# Patient Record
Sex: Male | Born: 1935 | Race: White | Hispanic: No | Marital: Married | Smoking: Former smoker
Health system: Southern US, Community
[De-identification: ages and names within clinical notes are randomized; demographics above are authoritative.]

## PROBLEM LIST (undated history)

## (undated) DIAGNOSIS — I10 Essential (primary) hypertension: Secondary | ICD-10-CM

## (undated) DIAGNOSIS — I4891 Unspecified atrial fibrillation: Secondary | ICD-10-CM

## (undated) DIAGNOSIS — I350 Nonrheumatic aortic (valve) stenosis: Secondary | ICD-10-CM

## (undated) DIAGNOSIS — D649 Anemia, unspecified: Secondary | ICD-10-CM

## (undated) DIAGNOSIS — K922 Gastrointestinal hemorrhage, unspecified: Secondary | ICD-10-CM

## (undated) DIAGNOSIS — K8020 Calculus of gallbladder without cholecystitis without obstruction: Secondary | ICD-10-CM

## (undated) DIAGNOSIS — I2510 Atherosclerotic heart disease of native coronary artery without angina pectoris: Secondary | ICD-10-CM

## (undated) DIAGNOSIS — D4620 Refractory anemia with excess of blasts, unspecified: Secondary | ICD-10-CM

## (undated) DIAGNOSIS — Z79899 Other long term (current) drug therapy: Secondary | ICD-10-CM

## (undated) DIAGNOSIS — M1990 Unspecified osteoarthritis, unspecified site: Secondary | ICD-10-CM

## (undated) DIAGNOSIS — R350 Frequency of micturition: Secondary | ICD-10-CM

## (undated) DIAGNOSIS — Z973 Presence of spectacles and contact lenses: Secondary | ICD-10-CM

## (undated) DIAGNOSIS — I714 Abdominal aortic aneurysm, without rupture, unspecified: Secondary | ICD-10-CM

## (undated) DIAGNOSIS — E785 Hyperlipidemia, unspecified: Secondary | ICD-10-CM

## (undated) DIAGNOSIS — D61818 Other pancytopenia: Secondary | ICD-10-CM

## (undated) DIAGNOSIS — I639 Cerebral infarction, unspecified: Secondary | ICD-10-CM

## (undated) DIAGNOSIS — I509 Heart failure, unspecified: Secondary | ICD-10-CM

## (undated) DIAGNOSIS — I4719 Other supraventricular tachycardia: Secondary | ICD-10-CM

## (undated) DIAGNOSIS — J439 Emphysema, unspecified: Secondary | ICD-10-CM

## (undated) DIAGNOSIS — I471 Supraventricular tachycardia: Secondary | ICD-10-CM

## (undated) DIAGNOSIS — I493 Ventricular premature depolarization: Secondary | ICD-10-CM

## (undated) DIAGNOSIS — Z8679 Personal history of other diseases of the circulatory system: Secondary | ICD-10-CM

## (undated) DIAGNOSIS — D631 Anemia in chronic kidney disease: Secondary | ICD-10-CM

## (undated) DIAGNOSIS — Z7901 Long term (current) use of anticoagulants: Secondary | ICD-10-CM

## (undated) HISTORY — DX: Abdominal aortic aneurysm, without rupture: I71.4

## (undated) HISTORY — DX: Essential (primary) hypertension: I10

## (undated) HISTORY — DX: Other pancytopenia: D61.818

## (undated) HISTORY — DX: Unspecified atrial fibrillation: I48.91

## (undated) HISTORY — PX: JOINT REPLACEMENT: SHX530

## (undated) HISTORY — PX: EYE SURGERY: SHX253

## (undated) HISTORY — DX: Cerebral infarction, unspecified: I63.9

## (undated) HISTORY — DX: Anemia in chronic kidney disease: D63.1

## (undated) HISTORY — DX: Hyperlipidemia, unspecified: E78.5

## (undated) HISTORY — DX: Long term (current) use of anticoagulants: Z79.01

## (undated) HISTORY — DX: Refractory anemia with excess of blasts, unspecified: D46.20

## (undated) HISTORY — PX: HEMORRHOID SURGERY: SHX153

## (undated) HISTORY — DX: Personal history of other diseases of the circulatory system: Z86.79

## (undated) HISTORY — DX: Abdominal aortic aneurysm, without rupture, unspecified: I71.40

## (undated) HISTORY — DX: Supraventricular tachycardia: I47.1

## (undated) HISTORY — DX: Ventricular premature depolarization: I49.3

## (undated) HISTORY — PX: TONSILLECTOMY: SUR1361

## (undated) HISTORY — DX: Heart failure, unspecified: I50.9

## (undated) HISTORY — DX: Other supraventricular tachycardia: I47.19

## (undated) HISTORY — DX: Unspecified osteoarthritis, unspecified site: M19.90

## (undated) HISTORY — PX: COLON SURGERY: SHX602

## (undated) HISTORY — DX: Emphysema, unspecified: J43.9

## (undated) HISTORY — DX: Atherosclerotic heart disease of native coronary artery without angina pectoris: I25.10

## (undated) HISTORY — PX: ABDOMINAL AORTIC ANEURYSM REPAIR: SHX42

## (undated) HISTORY — DX: Presence of spectacles and contact lenses: Z97.3

## (undated) HISTORY — DX: Calculus of gallbladder without cholecystitis without obstruction: K80.20

## (undated) HISTORY — DX: Other long term (current) drug therapy: Z79.899

## (undated) HISTORY — DX: Nonrheumatic aortic (valve) stenosis: I35.0

---

## 1968-02-14 HISTORY — PX: CORONARY ARTERY BYPASS GRAFT: SHX141

## 1998-02-13 HISTORY — PX: CORONARY ARTERY BYPASS GRAFT: SHX141

## 1998-04-29 ENCOUNTER — Ambulatory Visit (HOSPITAL_COMMUNITY): Admission: RE | Admit: 1998-04-29 | Discharge: 1998-04-29 | Payer: Self-pay | Admitting: Cardiology

## 1998-04-29 HISTORY — PX: CARDIAC CATHETERIZATION: SHX172

## 1998-05-10 ENCOUNTER — Encounter: Payer: Self-pay | Admitting: Surgery

## 1998-05-12 ENCOUNTER — Encounter: Payer: Self-pay | Admitting: Surgery

## 1998-05-12 ENCOUNTER — Inpatient Hospital Stay (HOSPITAL_COMMUNITY): Admission: RE | Admit: 1998-05-12 | Discharge: 1998-05-17 | Payer: Self-pay | Admitting: Surgery

## 1998-05-13 ENCOUNTER — Encounter: Payer: Self-pay | Admitting: Surgery

## 1998-05-14 ENCOUNTER — Encounter: Payer: Self-pay | Admitting: Surgery

## 1998-07-13 ENCOUNTER — Encounter (HOSPITAL_COMMUNITY): Admission: RE | Admit: 1998-07-13 | Discharge: 1998-09-13 | Payer: Self-pay | Admitting: Cardiology

## 1998-09-14 ENCOUNTER — Encounter (HOSPITAL_COMMUNITY): Admission: RE | Admit: 1998-09-14 | Discharge: 1998-12-13 | Payer: Self-pay | Admitting: Cardiology

## 2002-07-30 ENCOUNTER — Emergency Department (HOSPITAL_COMMUNITY): Admission: EM | Admit: 2002-07-30 | Discharge: 2002-07-30 | Payer: Self-pay | Admitting: Emergency Medicine

## 2004-02-14 DIAGNOSIS — I639 Cerebral infarction, unspecified: Secondary | ICD-10-CM

## 2004-02-14 HISTORY — PX: CAROTID ENDARTERECTOMY: SUR193

## 2004-02-14 HISTORY — DX: Cerebral infarction, unspecified: I63.9

## 2004-05-06 ENCOUNTER — Ambulatory Visit (HOSPITAL_COMMUNITY): Admission: RE | Admit: 2004-05-06 | Discharge: 2004-05-06 | Payer: Self-pay | Admitting: Family Medicine

## 2004-06-27 ENCOUNTER — Ambulatory Visit (HOSPITAL_COMMUNITY): Admission: RE | Admit: 2004-06-27 | Discharge: 2004-06-27 | Payer: Self-pay | Admitting: Ophthalmology

## 2004-06-29 ENCOUNTER — Ambulatory Visit (HOSPITAL_COMMUNITY): Admission: RE | Admit: 2004-06-29 | Discharge: 2004-06-29 | Payer: Self-pay | Admitting: Ophthalmology

## 2004-10-28 ENCOUNTER — Ambulatory Visit: Payer: Self-pay | Admitting: Internal Medicine

## 2004-10-28 ENCOUNTER — Inpatient Hospital Stay (HOSPITAL_COMMUNITY): Admission: EM | Admit: 2004-10-28 | Discharge: 2004-11-11 | Payer: Self-pay | Admitting: Emergency Medicine

## 2004-10-28 ENCOUNTER — Encounter: Payer: Self-pay | Admitting: Internal Medicine

## 2004-11-01 ENCOUNTER — Encounter (INDEPENDENT_AMBULATORY_CARE_PROVIDER_SITE_OTHER): Payer: Self-pay | Admitting: *Deleted

## 2004-12-26 ENCOUNTER — Ambulatory Visit (HOSPITAL_COMMUNITY): Admission: RE | Admit: 2004-12-26 | Discharge: 2004-12-26 | Payer: Self-pay | Admitting: Family Medicine

## 2006-02-13 HISTORY — PX: REPLACEMENT TOTAL KNEE: SUR1224

## 2006-02-13 HISTORY — PX: ABDOMINAL AORTIC ANEURYSM REPAIR: SUR1152

## 2006-05-11 ENCOUNTER — Ambulatory Visit: Payer: Self-pay | Admitting: Vascular Surgery

## 2006-09-03 ENCOUNTER — Inpatient Hospital Stay (HOSPITAL_COMMUNITY): Admission: RE | Admit: 2006-09-03 | Discharge: 2006-09-07 | Payer: Self-pay | Admitting: Orthopedic Surgery

## 2006-11-14 ENCOUNTER — Ambulatory Visit: Payer: Self-pay | Admitting: Vascular Surgery

## 2007-04-22 HISTORY — PX: US ECHOCARDIOGRAPHY: HXRAD669

## 2007-09-25 ENCOUNTER — Ambulatory Visit: Payer: Self-pay | Admitting: Vascular Surgery

## 2007-10-02 ENCOUNTER — Ambulatory Visit: Payer: Self-pay | Admitting: Vascular Surgery

## 2007-10-10 ENCOUNTER — Ambulatory Visit: Payer: Self-pay | Admitting: Vascular Surgery

## 2007-10-10 ENCOUNTER — Encounter: Admission: RE | Admit: 2007-10-10 | Discharge: 2007-10-10 | Payer: Self-pay | Admitting: Vascular Surgery

## 2007-10-29 ENCOUNTER — Inpatient Hospital Stay (HOSPITAL_COMMUNITY): Admission: RE | Admit: 2007-10-29 | Discharge: 2007-10-30 | Payer: Self-pay | Admitting: Vascular Surgery

## 2007-10-29 ENCOUNTER — Ambulatory Visit: Payer: Self-pay | Admitting: Vascular Surgery

## 2007-10-30 ENCOUNTER — Encounter: Payer: Self-pay | Admitting: Vascular Surgery

## 2007-11-27 ENCOUNTER — Ambulatory Visit: Payer: Self-pay | Admitting: Vascular Surgery

## 2007-11-27 ENCOUNTER — Encounter: Admission: RE | Admit: 2007-11-27 | Discharge: 2007-11-27 | Payer: Self-pay | Admitting: Vascular Surgery

## 2008-02-10 ENCOUNTER — Encounter: Admission: RE | Admit: 2008-02-10 | Discharge: 2008-02-10 | Payer: Self-pay | Admitting: Vascular Surgery

## 2008-02-26 ENCOUNTER — Ambulatory Visit: Payer: Self-pay | Admitting: Vascular Surgery

## 2008-09-02 ENCOUNTER — Encounter: Payer: Self-pay | Admitting: Gastroenterology

## 2008-09-02 ENCOUNTER — Ambulatory Visit: Payer: Self-pay | Admitting: Vascular Surgery

## 2008-09-02 ENCOUNTER — Encounter: Admission: RE | Admit: 2008-09-02 | Discharge: 2008-09-02 | Payer: Self-pay | Admitting: Vascular Surgery

## 2008-10-01 ENCOUNTER — Ambulatory Visit: Payer: Self-pay | Admitting: Gastroenterology

## 2008-10-01 DIAGNOSIS — I2510 Atherosclerotic heart disease of native coronary artery without angina pectoris: Secondary | ICD-10-CM | POA: Insufficient documentation

## 2008-10-01 DIAGNOSIS — R933 Abnormal findings on diagnostic imaging of other parts of digestive tract: Secondary | ICD-10-CM

## 2009-06-21 ENCOUNTER — Encounter: Payer: Self-pay | Admitting: Internal Medicine

## 2009-06-22 ENCOUNTER — Ambulatory Visit: Payer: Self-pay | Admitting: Internal Medicine

## 2009-06-22 ENCOUNTER — Inpatient Hospital Stay (HOSPITAL_COMMUNITY): Admission: EM | Admit: 2009-06-22 | Discharge: 2009-06-22 | Payer: Self-pay | Admitting: Emergency Medicine

## 2009-08-18 HISTORY — PX: US ECHOCARDIOGRAPHY: HXRAD669

## 2009-08-19 HISTORY — PX: CARDIOVASCULAR STRESS TEST: SHX262

## 2009-09-08 ENCOUNTER — Inpatient Hospital Stay (HOSPITAL_COMMUNITY): Admission: RE | Admit: 2009-09-08 | Discharge: 2009-09-14 | Payer: Self-pay | Admitting: General Surgery

## 2009-09-08 ENCOUNTER — Encounter (INDEPENDENT_AMBULATORY_CARE_PROVIDER_SITE_OTHER): Payer: Self-pay | Admitting: General Surgery

## 2009-09-20 ENCOUNTER — Encounter (INDEPENDENT_AMBULATORY_CARE_PROVIDER_SITE_OTHER): Payer: Self-pay | Admitting: General Surgery

## 2009-09-20 ENCOUNTER — Ambulatory Visit: Admission: RE | Admit: 2009-09-20 | Discharge: 2009-09-20 | Payer: Self-pay | Admitting: General Surgery

## 2009-09-20 ENCOUNTER — Ambulatory Visit: Payer: Self-pay | Admitting: Vascular Surgery

## 2009-11-10 ENCOUNTER — Ambulatory Visit: Payer: Self-pay | Admitting: Vascular Surgery

## 2009-11-10 ENCOUNTER — Encounter: Admission: RE | Admit: 2009-11-10 | Discharge: 2009-11-10 | Payer: Self-pay | Admitting: Vascular Surgery

## 2009-11-11 ENCOUNTER — Ambulatory Visit: Payer: Self-pay | Admitting: Vascular Surgery

## 2009-11-19 ENCOUNTER — Ambulatory Visit: Payer: Self-pay | Admitting: Vascular Surgery

## 2009-11-19 ENCOUNTER — Ambulatory Visit (HOSPITAL_COMMUNITY): Admission: RE | Admit: 2009-11-19 | Discharge: 2009-11-19 | Payer: Self-pay | Admitting: Vascular Surgery

## 2009-11-23 ENCOUNTER — Encounter: Admission: RE | Admit: 2009-11-23 | Discharge: 2009-11-23 | Payer: Self-pay | Admitting: Vascular Surgery

## 2010-03-05 ENCOUNTER — Other Ambulatory Visit: Payer: Self-pay | Admitting: Vascular Surgery

## 2010-03-05 DIAGNOSIS — I714 Abdominal aortic aneurysm, without rupture: Secondary | ICD-10-CM

## 2010-03-17 NOTE — Miscellaneous (Signed)
Summary: Hospital Admission  INTERNAL MEDICINE ADMISSION HISTORY AND PHYSICAL  Attending: Dr. Rogelia Boga 1st contact:  Dr. Arvilla Market [phone] 2nd contact: Dr. Gwenlyn Perking [phone]  Holidays, weekends and after 5 pm weekdays 1st: [phone] 2nd: [phone]  PCP: Aida Puffer  CC: None  HPI: [name] is an 77 y/o M with PMH outlined below who presents to the ED for c/c of acute onset left sided low back pain, associated with nausea and increased frequency.  He states the pain woke him from sleep on the morning of admission and is describes constant and severe in nature with no worsening or alleviating factors.  He also reports occasional malaise and nausea with dry heaves on the morning of admission and while in the ED.  He denies fever, chills, emesis, diarrhea, abdominal pain, hematuria, chest pain, SOB, dizziness, syncope and dysuria.  He admits to 1-2 days of increased urinary frequency and nocturia.    He was recently prescribed a course of abx for an infected cyst under his left axilla.  He reports this is significantly improved with abx and will be removed by his PCP soon.  He has no other concerns or complains.  ALLERGIES: NKDA   PAST MEDICAL HISTORY: Abdominal aortic aneurysm  Hx of coronary artery disease     - s/p 5 vessel CABG Hypertension Hx of Cerebrovascular accident x3  Hx of left carotid endarterectomy gout osteoarthritis      - s/p R TKA hyperlipidemia Kidney Stones Gout   MEDICATIONS: CENTRUM SILVER  TABS (MULTIPLE VITAMINS-MINERALS) 1 by mouth once daily MULTIVITAMINS   TABS (MULTIPLE VITAMIN) 1 by mouth at bedtime COREG 6.25 MG TABS (CARVEDILOL) 1 by mouth two times a day ASPIRIN 81 MG TABS (ASPIRIN) 1 by mouth at bedtime COLCHICINE 0.6 MG TABS (COLCHICINE) as needed for gout AMLODIPINE BESY-BENAZEPRIL HCL 2.5-10 MG CAPS (AMLODIPINE BESY-BENAZEPRIL HCL) 1 by mouth every pm    SOCIAL HISTORY: Occupation: Retired Married 4 boys 1 girl Patient currently smokes.    Alcohol Use - yes: 2 beers daily Daily Caffeine Use:2 cups of coffee daily Illicit Drug Use - no   FAMILY HISTORY Family History of Colon Cancer Family History of Heart Disease: Father-MI, deceased at 75 y/o Family History of Liver Cancer: Brother   ROS: as per HPI, all other systems reviewed and negative   VITALS: T: 97.8  P: 73  BP: 158/84>>>188/89  R: 14  O2SAT: 98% ON: 2L  PHYSICAL EXAM: General:  alert, well-developed, NAD, cooperative, A&Ox3 Head:  normocephalic and atraumatic.   Eyes:  PERRLA, EOMI, vision grossly intact, conjuctive and sclerae within normal limits.   Mouth:  MMM, no erythema, no exudates, or lesions.   Neck:  supple, full ROM, trachea midline, no palp masses, no JVD, no carotid bruits.   Lungs:  CTAB, normal respiratory effort  Heart: RRR Abdomen:  soft, NT, ND, BS present and normoactive, no palpable masses  GU: + left sided CVA tenderness and flank pain. Msk:  no joint swelling, warmth, or erythema  Neurologic:  No focal deficit   Skin:  turgor normal and no rashes.    LABS:  WBC                                      7.9               4.0-10.5           RBC  4.37              4.22-5.81          Hemoglobin (HGB)                         15.2              13.0-17.0          Hematocrit (HCT)                         43.8              39.0-52.0        %  MCV                                      100.2      h      78.0-100.0         MCHC                                     34.7              30.0-36.0        L  RDW                                      13.6              11.5-15.5        %  Platelet Count (PLT)                     179               150-400          L  Neutrophils, %                           82         h      43-77            %  Lymphocytes, %                           12                12-46            %  Monocytes, %                             5                 3-12             %  Eosinophils, %                            1                 0-5              %  Basophils, %  1                 0-1              %  Neutrophils, Absolute                    6.5               1.7-7.7            Lymphocytes, Absolute                    1.0               0.7-4.0            Monocytes, Absolute                      0.4               0.1-1.0            Eosinophils, Absolute                    0.0               0.0-0.7            Basophils, Absolute                      0.1               0.0-0.1             CKMB, POC                                2.1               1.0-8.0            Troponin I, POC                          <0.05             0.00-0.09          Myoglobin, POC                           164               12-200             Sodium (NA)                              139               135-145            Potassium (K)                            4.5               3.5-5.1              HEMOLYZED SPECIMEN, RESULTS MAY BE AFFECTED  Chloride  105               96-112             CO2                                      26                19-32              Glucose                                  119        h      70-99              BUN                                      13                6-23               Creatinine                               1.04              0.4-1.5            GFR, Est Non African American            >60               >60                GFR, Est African American                >60               >60                  Oversized comment, see footnote  1  Calcium                                  9.9               8.4-10.5           TCO2                                      24                0-100              Ionized Calcium                          1.03       l      1.12-1.32          Hemoglobin (HGB)                         16.7  13.0-17.0          Hematocrit (HCT)                         49.0               39.0-52.0        %  Sodium (NA)                              136               135-145            Potassium (K)                            4.5               3.5-5.1            Chloride                                 107               96-112             Glucose                                  113        h      70-99              BUN                                      18                6-23               Creatinine                               0.9               0.4-1.5             Color, Urine                             YELLOW            YELLOW  Appearance                               CLOUDY     a      CLEAR  Specific Gravity                         1.019             1.005-1.030  pH                                       6.0  5.0-8.0  Urine Glucose                            NEGATIVE          NEG                Bilirubin                                NEGATIVE          NEG  Ketones                                  NEGATIVE          NEG                Blood                                    TRACE      a      NEG  Protein                                  NEGATIVE          NEG                Urobilinogen                             0.2               0.0-1.0            Nitrite                                  POSITIVE   a      NEG  Leukocytes                               MODERATE   a      NEG   WBC / HPF                                21-50             <3                 RBC / HPF                                7-10              <3                 Bacteria / HPF                           MANY       a      RARE  ASSESSMENT AND PLAN: (1) UTI w/ possible pyelonephritis Pts hx and UA  are c/w UTI and possible pyleonephritis.  He is without signs or symptoms of systemic infection. - Will admit to regular floor - Gentle hydration with IVFs - Morphine for pain - Zofran for nausea - Send urine for cx and sensitivity - Start empiric abx with IV Cipro with plan to transition to  oral form once pt is no longer nauseated with dry heaves and sensitivity is back.    (2)Nausea Likely 2/2 #1.   - Will tx symptomatically with Zofran and add Phenergan if needed. - IVFs - Will check BMET in the am, follow and replete electrolytes as needed.  (3)HTN BP slightly elevated with SBP of 185 on admission.  This likely reflects acute elevation 2/2 pain/discomfort in addition to lack of regular meds (pt did not take any meds today). - Will restart home meds - Continue to monitor - control pain  (4)CAD This appears to be stable. - Will continue home dose of Coreg and ASA.  (5)Macrocytosis Will check B12 level, TSH and folic Acid level. Most likely due to increased alcohol consumption.  (6)VTE PROPH: Lovenox   Clinical Lists Changes

## 2010-04-27 LAB — POCT I-STAT, CHEM 8
Creatinine, Ser: 1 mg/dL (ref 0.4–1.5)
Glucose, Bld: 84 mg/dL (ref 70–99)
Hemoglobin: 12.9 g/dL — ABNORMAL LOW (ref 13.0–17.0)
TCO2: 27 mmol/L (ref 0–100)

## 2010-04-27 LAB — CBC
MCH: 28.9 pg (ref 26.0–34.0)
MCHC: 32.3 g/dL (ref 30.0–36.0)
Platelets: 177 10*3/uL (ref 150–400)

## 2010-04-29 LAB — CBC
Hemoglobin: 9.5 g/dL — ABNORMAL LOW (ref 13.0–17.0)
MCHC: 34 g/dL (ref 30.0–36.0)
MCHC: 34.8 g/dL (ref 30.0–36.0)
MCV: 97.6 fL (ref 78.0–100.0)
Platelets: 144 10*3/uL — ABNORMAL LOW (ref 150–400)
Platelets: 175 10*3/uL (ref 150–400)
RBC: 2.82 MIL/uL — ABNORMAL LOW (ref 4.22–5.81)
RDW: 14.2 % (ref 11.5–15.5)
WBC: 4.2 10*3/uL (ref 4.0–10.5)

## 2010-04-30 LAB — COMPREHENSIVE METABOLIC PANEL
AST: 16 U/L (ref 0–37)
Albumin: 3.8 g/dL (ref 3.5–5.2)
BUN: 15 mg/dL (ref 6–23)
Calcium: 9.3 mg/dL (ref 8.4–10.5)
Chloride: 103 mEq/L (ref 96–112)
Creatinine, Ser: 1.16 mg/dL (ref 0.4–1.5)
GFR calc Af Amer: >60 mL/min (ref >60)
Total Protein: 6.3 g/dL (ref 6.0–8.3)

## 2010-04-30 LAB — BASIC METABOLIC PANEL
BUN: 14 mg/dL (ref 6–23)
CO2: 28 mEq/L (ref 19–32)
Calcium: 8.5 mg/dL (ref 8.4–10.5)
Calcium: 8.9 mg/dL (ref 8.4–10.5)
Chloride: 102 mEq/L (ref 96–112)
Chloride: 108 mEq/L (ref 96–112)
Creatinine, Ser: 0.78 mg/dL (ref 0.4–1.5)
Creatinine, Ser: 0.87 mg/dL (ref 0.4–1.5)
Creatinine, Ser: 0.92 mg/dL (ref 0.4–1.5)
GFR calc Af Amer: >60 mL/min (ref >60)
GFR calc Af Amer: >60 mL/min (ref >60)
GFR calc non Af Amer: >60 mL/min (ref >60)
Glucose, Bld: 114 mg/dL — ABNORMAL HIGH (ref 70–99)
Glucose, Bld: 93 mg/dL (ref 70–99)
Potassium: 4.4 mEq/L (ref 3.5–5.1)
Sodium: 137 mEq/L (ref 135–145)

## 2010-04-30 LAB — CBC
HCT: 28.3 % — ABNORMAL LOW (ref 39.0–52.0)
HCT: 34.6 % — ABNORMAL LOW (ref 39.0–52.0)
Hemoglobin: 10.5 g/dL — ABNORMAL LOW (ref 13.0–17.0)
Hemoglobin: 11.8 g/dL — ABNORMAL LOW (ref 13.0–17.0)
Hemoglobin: 12.5 g/dL — ABNORMAL LOW (ref 13.0–17.0)
Hemoglobin: 8.7 g/dL — ABNORMAL LOW (ref 13.0–17.0)
MCH: 34 pg (ref 26.0–34.0)
MCH: 34.1 pg — ABNORMAL HIGH (ref 26.0–34.0)
MCH: 34.2 pg — ABNORMAL HIGH (ref 26.0–34.0)
MCH: 34.3 pg — ABNORMAL HIGH (ref 26.0–34.0)
MCHC: 33.9 g/dL (ref 30.0–36.0)
MCHC: 34 g/dL (ref 30.0–36.0)
MCHC: 34.2 g/dL (ref 30.0–36.0)
MCHC: 34.2 g/dL (ref 30.0–36.0)
MCHC: 34.4 g/dL (ref 30.0–36.0)
MCV: 100.1 fL — ABNORMAL HIGH (ref 78.0–100.0)
MCV: 100.3 fL — ABNORMAL HIGH (ref 78.0–100.0)
Platelets: 113 10*3/uL — ABNORMAL LOW (ref 150–400)
Platelets: 128 10*3/uL — ABNORMAL LOW (ref 150–400)
Platelets: 130 10*3/uL — ABNORMAL LOW (ref 150–400)
Platelets: 136 10*3/uL — ABNORMAL LOW (ref 150–400)
Platelets: 161 10*3/uL (ref 150–400)
RBC: 2.54 MIL/uL — ABNORMAL LOW (ref 4.22–5.81)
RBC: 3.08 MIL/uL — ABNORMAL LOW (ref 4.22–5.81)
RBC: 3.45 MIL/uL — ABNORMAL LOW (ref 4.22–5.81)
RDW: 14.8 % (ref 11.5–15.5)
RDW: 15 % (ref 11.5–15.5)
WBC: 5.5 10*3/uL (ref 4.0–10.5)
WBC: 8.9 10*3/uL (ref 4.0–10.5)

## 2010-04-30 LAB — SURGICAL PCR SCREEN
MRSA, PCR: NEGATIVE
Staphylococcus aureus: NEGATIVE

## 2010-04-30 LAB — CEA: CEA: 1.2 ng/mL (ref 0.0–5.0)

## 2010-04-30 LAB — DIFFERENTIAL
Eosinophils Relative: 3 % (ref 0–5)
Lymphocytes Relative: 25 % (ref 12–46)
Lymphs Abs: 1 10*3/uL (ref 0.7–4.0)
Monocytes Absolute: 0.5 10*3/uL (ref 0.1–1.0)
Monocytes Relative: 13 % — ABNORMAL HIGH (ref 3–12)
Neutro Abs: 2.3 10*3/uL (ref 1.7–7.7)

## 2010-04-30 LAB — MAGNESIUM: Magnesium: 1.8 mg/dL (ref 1.5–2.5)

## 2010-05-03 LAB — DIFFERENTIAL
Basophils Relative: 1 % (ref 0–1)
Eosinophils Absolute: 0 10*3/uL (ref 0.0–0.7)
Eosinophils Relative: 1 % (ref 0–5)
Lymphs Abs: 1 10*3/uL (ref 0.7–4.0)
Monocytes Relative: 5 % (ref 3–12)
Neutrophils Relative %: 82 % — ABNORMAL HIGH (ref 43–77)

## 2010-05-03 LAB — BASIC METABOLIC PANEL
BUN: 13 mg/dL (ref 6–23)
BUN: 9 mg/dL (ref 6–23)
CO2: 26 mEq/L (ref 19–32)
CO2: 28 mEq/L (ref 19–32)
Calcium: 8.9 mg/dL (ref 8.4–10.5)
Chloride: 101 mEq/L (ref 96–112)
Chloride: 105 mEq/L (ref 96–112)
Creatinine, Ser: 0.84 mg/dL (ref 0.4–1.5)
Creatinine, Ser: 1.04 mg/dL (ref 0.4–1.5)
GFR calc Af Amer: >60 mL/min (ref >60)
Glucose, Bld: 143 mg/dL — ABNORMAL HIGH (ref 70–99)
Potassium: 4.5 mEq/L (ref 3.5–5.1)

## 2010-05-03 LAB — URINE MICROSCOPIC-ADD ON

## 2010-05-03 LAB — HEPATIC FUNCTION PANEL
ALT: 16 U/L (ref 0–53)
AST: 30 U/L (ref 0–37)
Albumin: 4 g/dL (ref 3.5–5.2)
Alkaline Phosphatase: 82 U/L (ref 39–117)
Bilirubin, Direct: 0.2 mg/dL (ref 0.0–0.3)
Total Bilirubin: 1.1 mg/dL (ref 0.3–1.2)

## 2010-05-03 LAB — CBC
HCT: 43.8 % (ref 39.0–52.0)
MCHC: 34.7 g/dL (ref 30.0–36.0)
MCHC: 35.2 g/dL (ref 30.0–36.0)
MCV: 100.2 fL — ABNORMAL HIGH (ref 78.0–100.0)
MCV: 100.3 fL — ABNORMAL HIGH (ref 78.0–100.0)
Platelets: 179 10*3/uL (ref 150–400)
RBC: 3.77 MIL/uL — ABNORMAL LOW (ref 4.22–5.81)
RBC: 4.37 MIL/uL (ref 4.22–5.81)
RDW: 13.4 % (ref 11.5–15.5)

## 2010-05-03 LAB — POCT I-STAT, CHEM 8
Calcium, Ion: 1.03 mmol/L — ABNORMAL LOW (ref 1.12–1.32)
Creatinine, Ser: 0.9 mg/dL (ref 0.4–1.5)
Glucose, Bld: 113 mg/dL — ABNORMAL HIGH (ref 70–99)
HCT: 49 % (ref 39.0–52.0)
Hemoglobin: 16.7 g/dL (ref 13.0–17.0)
Potassium: 4.5 mEq/L (ref 3.5–5.1)
TCO2: 24 mmol/L (ref 0–100)

## 2010-05-03 LAB — POCT CARDIAC MARKERS: CKMB, poc: 2.1 ng/mL (ref 1.0–8.0)

## 2010-05-03 LAB — URINALYSIS, ROUTINE W REFLEX MICROSCOPIC
Ketones, ur: NEGATIVE mg/dL
Nitrite: POSITIVE — AB
pH: 6 (ref 5.0–8.0)

## 2010-05-03 LAB — TSH: TSH: 2.397 u[IU]/mL (ref 0.350–4.500)

## 2010-05-03 LAB — URINE CULTURE: Colony Count: >=100000

## 2010-05-03 LAB — FOLATE: Folate: >20 ng/mL

## 2010-06-01 ENCOUNTER — Other Ambulatory Visit: Payer: Self-pay | Admitting: Interventional Radiology

## 2010-06-01 DIAGNOSIS — T82330A Leakage of aortic (bifurcation) graft (replacement), initial encounter: Secondary | ICD-10-CM

## 2010-06-02 ENCOUNTER — Other Ambulatory Visit: Payer: Self-pay

## 2010-06-02 ENCOUNTER — Ambulatory Visit: Payer: Self-pay | Admitting: Vascular Surgery

## 2010-06-23 ENCOUNTER — Ambulatory Visit: Payer: Self-pay | Admitting: Vascular Surgery

## 2010-06-23 ENCOUNTER — Other Ambulatory Visit: Payer: Self-pay

## 2010-06-23 ENCOUNTER — Ambulatory Visit
Admission: RE | Admit: 2010-06-23 | Discharge: 2010-06-23 | Disposition: A | Discharge status: Home / Self Care | Payer: Medicare Other | Source: Ambulatory Visit | Attending: Vascular Surgery | Admitting: Vascular Surgery

## 2010-06-23 ENCOUNTER — Ambulatory Visit (INDEPENDENT_AMBULATORY_CARE_PROVIDER_SITE_OTHER): Payer: Medicare Other | Admitting: Vascular Surgery

## 2010-06-23 DIAGNOSIS — I714 Abdominal aortic aneurysm, without rupture: Secondary | ICD-10-CM

## 2010-06-23 MED ORDER — IOHEXOL 350 MG/ML SOLN
100.0000 mL | Freq: Once | INTRAVENOUS | Status: AC | PRN
  Administered 2010-06-23: 100 mL via INTRAVENOUS

## 2010-06-24 NOTE — Assessment & Plan Note (Signed)
OFFICE VISIT  [name], [name] DOB:  06-Dec-1926                                       06/23/2010 ZOXWR#:[id number]  The patient returns for followup today.  He was last seen in September of 2011.  He previously underwent placement of a Gore excluder stent graft in September of 2009.  At that time his aortic aneurysm was 5.1 cm in diameter.  Since placement of the stent graft he has had a type 2 endoleak.  He has had a small amount of aneurysm growth over time.  He returns for further followup today regarding this.  He denies any abdominal or back pain.  He states that overall he feels well.  He has lost some weight recently but this has been intentional.  We have also followed the patient for carotid disease.  He previously had a left carotid endarterectomy in 2006.  He has had no symptoms of TIA or amaurosis or stroke since then.  MEDICATIONS: 1. Include Coreg 6.25 mg once a day. 2. Vitamin C. 3. Vitamin E. 4. Aspirin 81 mg once a day. 5. Co-Q 10. 6. Multivitamin. 7. Colchicine 0.6 mg p.r.n.  ALLERGIES:  He has no known drug allergies.  CHRONIC MEDICAL PROBLEMS:  Include hypertension, both of these are currently controlled.  SOCIAL HISTORY:  He still continues to smoke 3-4 cigars per day and drinks approximately 3-4 beers per day.  PHYSICAL EXAM:  Vital signs:  Blood pressure is 181/78 in the right arm, heart rate 69 and regular, oxygen saturation is 98%.  HEENT: Unremarkable.  Neck:  Has 2+ carotid pulses without bruit.  Chest: Clear to auscultation.  Cardiac:  Regular rate and rhythm without murmur.  Abdomen:  Soft, nontender, nondistended.  He is thin but there is no palpable pulsatile mass in the epigastrium.  He has 2+ femoral pulses bilaterally.  I reviewed his CT angiogram of the abdomen and pelvis from today.  This shows that the aneurysm diameter is fairly stable compared to the fall but again has increased in size and is currently  6.4 cm in diameter. Type 2 endoleak is still noted.  SUMMARY: 1. Carotid occlusive disease.  Overall he has no symptoms at this     point.  His most recent carotid duplex showed no evidence of     restenosis.  I believe the best management for this is continued     antiplatelet therapy and risk factor modification. 2. As far as his aneurysm is concerned he has had aneurysm growth     since placement of his aortic stent graft.  He has a known type 2     leak.  The aneurysm seems to maybe have grown slightly since his     last scan in the fall.  He previously had an aortogram which showed     a couple of small lumbar arteries but these did not appear to fill     the aneurysm sac.  I believe that this warrants further     investigation with either consideration for direct puncture of the     aortic sac and coil embolization of these lumbar arteries or a     repeat aortogram with selective catheterization of the iliac and     mesenteric branches to see if we can find a communication to the     aneurysm sac.  I left a message with Dr. Ruel Favors from     interventional radiology who had previously seen the patient in the     fall.  He will return my call later in the day to determine what     the next best course of action would be for evaluation of the     patient's aneurysm.  I will call the patient regarding the     findings.    Janetta Hora. Ikeya Brockel, MD Electronically Signed  CEF/MEDQ  D:  06/24/2010  T:  06/24/2010  Job:  [id number]  cc:   Peter M. [place], M.D. Aida Puffer, MD

## 2010-06-28 NOTE — Op Note (Signed)
NAME:  [name], [name] NO.:  [id number]   MEDICAL RECORD NO.:  [id number]          PATIENT TYPE:  INP   LOCATION:  0002                         FACILITY:  [facility]   PHYSICIAN:  John L. Rendall, M.D.  DATE OF BIRTH:  09/22/1926   DATE OF PROCEDURE:  09/03/2006  DATE OF DISCHARGE:                               OPERATIVE REPORT   PREOPERATIVE DIAGNOSIS:  Osteoarthritis, right knee.   POSTOPERATIVE DIAGNOSIS:  Osteoarthritis, right knee.   SURGICAL PROCEDURES:  Right LCS total knee replacement with computer  navigation assistance.   SURGEON:  John L. Rendall, M.D.   ASSISTANTArlys John D. Petrarca, P.A.-C.   ANESTHESIA:  Spinal.   PATHOLOGY:  Bone against bone, medial compartment, right knee, with a  fixed 5.5 degrees varus deformity and hyperextension of about 5 degrees.   JUSTIFICATION FOR COMPUTER:  Varus deformity.   PROCEDURE:  Under spinal anesthesia, the right leg was prepared with  DuraPrep and draped as a sterile field, with a sterile tourniquet  applied to the proximal thigh.  The leg was wrapped out with the  Esmarch.  The tourniquet is used at 300 mm.  A midline incision is made.  The patella is everted.  The femur is sized to a large.  The knee is  debrided in preparation for computer mapping.  Schanz pins are then  placed through extra stab wounds in the superomedial tibia and the  distal medial femur.  The arrays are set up.  Computer mapping is then  performed.  Once this is done and verified within 0.7 mm of anatomic  accuracy, the knee parameters are measured.  The knee is in 5.5 degrees  of varus.  It is hyperextending 5 degrees.  The proximal tibial  resection is then carried out using the computer as a guide.  Balancing  then is done of the medial structures.  Initially after the resection of  the proximal tibia, it was still in 3 degrees of varus.  More stripping  along the medial capsule and MCL is done.  This brought it within 0  degrees  of anatomic alignment, and the attention is then turned to the  femur.  The anterior and posterior flare of the distal femur are  resected using the computer guide.  Distal femoral cut is then done,  making sure flexion and extension gaps are balanced at 10 mm.  Debridement is then done with lamina spreader, osteotome, and mallet,  removing spurs off the back of the femoral condyle.  Once this is  completed, the recessing guide is then used.  The proximal tibia is then  exposed.  It is sized to a #5.  Center peg hole with keel is inserted.  Trial reduction of a #5 tibia, 10 bearing, and large femur reveals  excellent fit, alignment, and stability.  The computer verifies 0  degrees extension.  It shows an amazing slight varus that is not visible  clinically.  The leg looks to be in slight valgus.  At this point,  computer assistance is terminated and permanent components obtained.  The bony surfaces are  prepared with pulse irrigation.  The permanent  components are cemented in place.  Once it hardens, the tourniquet is  let down  right at 1 hour.  Multiple small vessels are cauterized.  A medium  Hemovac is inserted.  The knee is then closed in layers with #1 Tycron,  #1 Vicryl, 2-0 Vicryl, and skin clips.  Operative time an hour and 10  minutes.   The patient tolerated the procedure well and returned to recovery in  good condition.      John L. Rendall, M.D.  Electronically Signed     JLR/MEDQ  D:  09/03/2006  T:  09/03/2006  Job:  [id number]

## 2010-06-28 NOTE — Assessment & Plan Note (Signed)
OFFICE VISIT   [name], [name]  DOB:  [birth year]                                       11/14/2006  ZOXWR#:[id number]   Mr. Police returns for followup today.  We are following for a small  abdominal aortic aneurysm as well as carotid stenosis.  He states that  he has been doing well since we last saw him at the end of March, 2008.  He reports no symptoms of TIA or stroke.  He recently had his right knee  replaced without difficulty.  He denies any abdominal or back pain.  He  states that recently on a followup x-ray, he has some calcification  around his artery in his right knee; however, he states he does not have  any claudication symptoms and no rest pain.  His atherosclerotic risk  factors continue to include elevated cholesterol and hypertension.  He  does not smoke.   PHYSICAL EXAMINATION:  VITAL SIGNS:  Blood pressure 192/96, pulse 71 and  regular.  HEENT:  Unremarkable.  NECK:  He has 2+ carotid pulses with no bruits.  Well healed left neck  incision.  CHEST:  Clear to auscultation.  CARDIOVASCULAR:  Regular rate and rhythm.  ABDOMEN:  Soft, nontender, nondistended with an easily palpable  pulsatile mass to the left of the epigastrium.  EXTREMITIES:  He has 2+ femoral, 3+ right popliteal, 2+ left popliteal  and 1+ pedal pulses.  The right leg is slightly more swollen than the  left and he has a healing midline knee incision.  He has no ulcerations  on the feet.   Overall, [name] is doing well.  He has had no recurrent TIA or  stroke symptoms.  We will re-duplex his carotid in 6 months' time.  At  that time, also, we will duplex his popliteal arteries to make sure he  has no evidence of aneurysm in that location, since his right pulse was  fairly full.  As far as his abdominal aortic aneurysm is concerned, he  is still well below 5.5 cm that we would consider repair.  He was 4.6 cm  today, which may be slightly increased from September,  2007.  If it ever  approaches 5.5 cm we will consider repair at that time.  I reinforced to  [name] that he should continue to take his aspirin.  Overall, he  seems to take good care of himself and is a pleasant patient to take  care of.   Janetta Hora. Fields, MD  Electronically Signed   CEF/MEDQ  D:  11/14/2006  T:  11/15/2006  Job:  [id number]   cc:   Flint Melter, M.D.

## 2010-06-28 NOTE — Procedures (Signed)
DUPLEX ULTRASOUND OF ABDOMINAL AORTA   INDICATION:  Followup of known AAA.   HISTORY:  Diabetes:  No.  Cardiac:  CABG x5 in 2000.  Hypertension:  Yes.  Smoking:  Yes, a few cigars daily.  Connective Tissue Disorder:  Family History:  Brother.  Previous Surgery:  Left CEA with DPA in 10/2004 by Dr. Darrick Penna.  Cardiac.   DUPLEX EXAM:         AP (cm)                   TRANSVERSE (cm)  Proximal             1.62 cm                   1.77 cm  Mid                  5.02 cm                   5.02 cm  Distal               1.59 cm                   1.77 cm  Right Iliac          1.16 cm                   1.16 cm  Left Iliac           1.04 cm                   1.04 cm   PREVIOUS:  Date: 11/14/2006  AP:  4.54  TRANSVERSE:  4.64   IMPRESSION:  1. An increase in known AAA with maximum diameter measuring 5.02 cm      (AP) x 5.02 cm.  2. Mildly ectatic CIAs bilaterally, right > left.  3. Right popliteal artery with maximum diameter measuring 0.83 cm (AP)      x 0.83 cm.  4. Left popliteal artery with maximum diameter measuring 0.76 cm (AP)      x 0.72 cm.  5. Patient has appointment with Dr. Darrick Penna on 10/02/2007 at 10 a.m.   ___________________________________________  Janetta Hora. Fields, MD   PB/MEDQ  D:  09/26/2007  T:  09/26/2007  Job:  [id number]

## 2010-06-28 NOTE — Assessment & Plan Note (Signed)
OFFICE VISIT   [name], [name]  DOB:  06/27/1926                                       10/02/2007  OZHYQ#:[id number]   The patient is a 77 year old male who we have been following for an  abdominal aortic aneurysm and previous carotid stenosis with previous  left carotid endarterectomy.  He presents today for repeat aortic  ultrasound and carotid duplex exam.  Since his last office visit he has  had no significant problems.  He states he does fatigue easily.  Unfortunately he continues to smoke a few cigars daily.   PAST MEDICAL AND SURGICAL HISTORY:  Remarkable for coronary artery  bypass grafting by Dr. Laneta Simmers in 2000.  He had a hemorrhoidectomy in  1996.  He also has a history of gout, hypertension and elevated  cholesterol.  He underwent left carotid endarterectomy in September of  2006.   FAMILY HISTORY:  Unremarkable.   SOCIAL HISTORY:  He is married and has five children.  He is a former  Medical illustrator.  Smoking history is as above.  Alcohol, he currently is  consuming two 22 ounce Budweisers daily.   REVIEW OF SYSTEMS:  CONSTITUTIONAL:  He is 5 feet 11 inches and 175  pounds.  Cardiac, pulmonary, GI, renal, neurologic, orthopedic, psychiatric, ENT  and hematologic review of systems are all negative.  ORTHOPEDIC:  He still has some pain in his right knee after having this  replaced in 2008.   MEDICATIONS:  1. Include CoQ10 50 mg two times daily.  2. Centrum once daily.  3. Aspirin 81 mg once a day.  4. Lipitor 10 mg once a day.  5. Colchicine 0.6 mg once a day.  6. Carvedilol 6.25 mg two times a day.  7. Amlodipine/benaz 2.5/10 once a day.   PHYSICAL EXAM:  Vital signs:  Blood pressure is 129/68 in the left arm,  pulse is 68 and regular.  HEENT:  Is unremarkable.  NECK:  Left neck has  a well-healed incision.  He has no carotid bruits.  Chest:  Clear to  auscultation.  Cardiac:  Regular rate and rhythm with a 3/6 systolic  murmur.  Abdomen:   Soft, nontender, nondistended with an easily palpable  pulsatile mass.  Lower extremity exam:  Showed 2+ femoral pulses, absent  right popliteal and pedal pulses, a 1+ left popliteal and left dorsalis  pedis pulse.  He also has a bruit in the right femoral region.  Neurological:  Exam shows symmetric upper extremity and lower extremity  motor strength which is 5/5.   He had a carotid duplex exam today which showed no evidence of  restenosis and still less than 80% stenosis on the contralateral side.  However, his abdominal aortic aneurysm has continued to grow and is now  over 5 cm in diameter by ultrasound.  I had a lengthy discussion today  with the patient about considering fixing his abdominal aortic aneurysm.  Options of stent graft repair and open repair were discussed with the  patient.  He will have a CT angiogram and follow up with me next week  for consideration of repair of this.  If repair is warranted we will  need to discuss his current cardiac status with Dr. Sw[place] as well.   Janetta Hora. Fields, MD  Electronically Signed   CEF/MEDQ  D:  10/03/2007  T:  10/03/2007  Job:  [id number]   cc:   Aida Puffer

## 2010-06-28 NOTE — Procedures (Signed)
LOWER EXTREMITY ARTERIAL EVALUATION-SINGLE LEVEL   INDICATION:  Left leg pain.   HISTORY:  Diabetes:  No.  Cardiac:  CABG x5 in 2000.  Hypertension:  Yes.  Smoking:  Yes, a few cigars daily.  Previous Surgery:  Left CEA with DPA in September of 2006 by Dr. Darrick Penna.   RESTING SYSTOLIC PRESSURES: (ABI)                          RIGHT                LEFT  Brachial:               154                  152  Anterior tibial:        170 (1.10)           170  Posterior tibial:       160                  Inaudible  Peroneal:                                    180 (1.17)  DOPPLER WAVEFORM ANALYSIS:  Anterior tibial:        Biphasic             Triphasic  Posterior tibial:       Biphasic  Peroneal:                                    Triphasic   PREVIOUS ABI'S:  Date:  RIGHT:  LEFT:   IMPRESSION:  Normal ABIs bilaterally.   ___________________________________________  Janetta Hora Fields, MD   PB/MEDQ  D:  09/26/2007  T:  09/26/2007  Job:  [id number]

## 2010-06-28 NOTE — Op Note (Signed)
NAME:  [name], [name] NO.:  [id number]   MEDICAL RECORD NO.:  [id number]          PATIENT TYPE:  INP   LOCATION:  3315                         FACILITY:  [facility]   PHYSICIAN:  Janetta Hora. Fields, MD  DATE OF BIRTH:  [birth year]   DATE OF PROCEDURE:  10/29/2007  DATE OF DISCHARGE:                               OPERATIVE REPORT   PROCEDURE:  Placement of Gore excluder aneurysm stent graft.   PREOPERATIVE DIAGNOSIS:  Abdominal aortic aneurysm.   POSTOPERATIVE DIAGNOSIS:  Abdominal aortic aneurysm.   ANESTHESIA:  General.   ASSISTANT:  Larina Earthly, MD   OPERATIVE FINDINGS:  1. A 23 x 14 x 14 main body device, left side deployment.  2. A 14 x 12 right iliac limb.   OPERATIVE DETAIL:  After obtaining informed consent, the patient was  taken to the operating room.  The patient was placed in the supine  position on the operating table.  After induction of general anesthesia  and endotracheal intubation, the patient was prepped from the xiphoid  down to the knees.  Next, bilateral oblique incisions were made in each  groin to expose the common femoral arteries.  Dr. Arbie Cookey provided  exposure and repair of the right common femoral artery and this was  dictated as a separate operative note.  The left and right common iliac  and common femoral arteries were fairly calcified with plaque.  There  was almost circumferential plaque in the common femoral artery distally  just above the femoral bifurcation and there was a soft area in the  artery on the anterior surface up adjacent to the level of the inguinal  ligament.  Next, a Majestic needle was used to cannulate the left common  femoral artery and a 0.035 J-tip guidewire was threaded in the abdominal  aorta under the fluoroscopic guidance.  A short 8-French sheath was  placed over the guidewire in the left common femoral artery and this was  flushed thoroughly with heparinized saline.  A 5-French Omni flush  catheter  was then placed over the guidewire into the distal abdominal  aorta.  Abdominal aortogram was obtained.  This showed bilateral single  patent renal arteries.  The Omni flush calibrated catheter was used to  measure the length from the renal arteries down to the iliac  bifurcation.  A 23 x 14 x 14 device was selected.  This was going to  give [place] approximately 2-3 cm of overlap into the left common iliac  artery without overlap of the hypogastric artery.  At this point, the  access was gained to the right common femoral artery with a Majestic  needle and a 0.035 J-tip guide wire was threaded into the abdominal  aorta and a long 8-French sheath was placed over this into the right  common femoral system.  The 5-French Omni flush catheter was then moved  over to the right side and threaded up into the distal abdominal aorta.  On the left side, the 8-French sheath was removed after replacing the  Omni flush catheter with a 0.035 Amplatz stiff wire.  The sheath  and  Omni flush catheter had been removed from the left side.  An 18-French  sheath was then brought up into the operative field and threaded over  the Amplatz wire into the left common femoral system and delivered up to  the level of the renal arteries.  Magnified views of the renal arteries  were then obtained with contrast angiogram to determine the precise  level of the renal arteries.  The 23 x 14 x 14 main body Gore excluder  device was then threaded over the Amplatz wire up to the level of the  renal arteries.  The sheath was pulled back and the device was deployed  below the level of both renal arteries down into the left common iliac  system.  The delivery device was then removed.  Attention was then  turned to the right side.  The Omni flush catheter was pulled down to  the level of the gate.  Using the J-wire and the Omni flush catheter,  the gate was selectively catheterize and the Omni flush catheter was  advanced into the  main body of the stent.  This was then twirled to  confirm location within side of the stent.  The J-wire was then removed  from the Omni flush catheter and exchanged for a 0.035 Amplatz wire.  This was threaded into the distal descending thoracic aorta and the long  8-French sheath on the right side was exchanged for a 16-French sheath.  The sheath was then advanced up to the level of the long marker on the  main body of the graft.  A retrograde contrast angiogram was then  obtained with the marker catheter in place and a 14 x 12 cm limb was  selected for the right side to extend down to, but not over the right  internal iliac artery.  The marker catheter was then removed and the  device was brought up in the operative field and placed through the  sheath and then with complete overlap of the long marker on the  contralateral side, the iliac limb was deployed after pulling the sheath  back.  Next, a coated balloon was brought up in the operative field and  this was placed over the Amplatz wire up to the level of the proximal  aortic graft and this was ballooned to secure the graft to the wall of  the aorta.  The balloon was then brought down to the aortic bifurcation  and inflated at this location and at the right distal common iliac limb.  The coated balloon was then exchanged to the left side and the distal  endpoint of the left iliac limb was also ballooned to tack and sew it in  place.  The coated balloon was then removed and the 5-French Omni flush  catheter was placed back over the Amplatz wire on the left side.  The  Amplatz wire was then removed and a completion of arteriogram was  obtained.  It showed that the left and right renal arteries were widely  patent.  There was no proximal or distal type 1 endoleak.  There was no  type 2 endoleak.  There was good runoff into the internal iliac arteries  bilaterally.  At this point, all guidewires and sheaths were removed.  The right  common iliac artery was clamped proximally with a Henley clamp  and controlled distally with a peripheral DeBakey clamp and a vessel  loop due to the heavy calcification.  The right common femoral  artery  again was dictated as by Dr. Bosie Helper notes.  On the left common femoral  artery, the left common femoral was controlled proximally with a Henley  clamp and distally with vessel loops and a Cooley clamp.  There was some  continuous backbleeding due to heavy calcification, but I did not  control this more due to the fears of rupturing the plaque in the left  common femoral artery.  Left common femoral artery was then  reapproximated using running 5-0 Prolene suture.  Just prior to  completion of anastomosis, it was forebled, backbled, and thoroughly  flushed.  Anastomosis was secured.  Clamps were released.  There was  palpable pulse in the distal common femoral artery immediately.  Next,  hemostasis was obtained.  The patient had been given 7000 units of  heparin prior to placing the stent graft devices in the abdominal aorta.  The patient was given 50 mg of protamine at this portion of the case.  Hemostasis was obtained.  Left groin was closed in multiple layers with  running 2-0, 3-0, and 4-0 Vicryl suture.  The patient tolerated the  procedure well and there were no complications.  Instrument, sponge, and  needle counts were correct at the end of the case.  The patient was  taken to the recovery room in stable condition.  Feet were pink and warm  at the time the patient left the operating room.      Janetta Hora. Fields, MD  Electronically Signed     CEF/MEDQ  D:  10/29/2007  T:  10/29/2007  Job:  [id number]

## 2010-06-28 NOTE — Discharge Summary (Signed)
NAME:  Patrick, Griffith NO.:  [id number]   MEDICAL RECORD NO.:  [id number]          PATIENT TYPE:  INP   LOCATION:  3315                         FACILITY:  [facility]   PHYSICIAN:  Janetta Hora. Fields, MD  DATE OF BIRTH:  Dec 17, 1926   DATE OF ADMISSION:  10/29/2007  DATE OF DISCHARGE:  10/30/2007                               DISCHARGE SUMMARY   ADMISSION DIAGNOSIS:  Abdominal aortic aneurysm.   SECONDARY DIAGNOSES:  1. Abdominal aortic aneurysm status post stent-graft repair.  2. History of coronary artery disease status post 5-vessel coronary      artery bypass grafting.  3. Hypertension.  4. History of cerebrovascular accident x3 with history of left carotid      endarterectomy.  5. History of gout.  6. Osteoarthritis, right knee status post right total knee      arthroplasty in August 2008.  7. Hyperlipidemia.   ALLERGIES:  No known drug allergies.   PROCEDURES:  October 29, 2007, placement of Gore Excluder aneurysm  stent graft by Dr. Fabienne Bruns (23 x 14 x 14 main body device, left-  side deployment; 14 x 12 right iliac limb).   BRIEF HISTORY:  [name] is a 77 year old Caucasian male who has been  followed by Dr. Fabienne Bruns for abdominal aortic aneurysm that  measured 5.1 cm in size.  Due to the size of aneurysm, repair was  recommended, and he was felt to be a candidate for stent graft repair.   HOSPITAL COURSE:  [name] was electively admitted to [facility] on October 29, 2007, underwent the previously mentioned  procedure.  Postoperatively, he had a 2-view abdominal x-ray showing  satisfactory positioning and placement of an aorto-bi-iliac stent graft.  He has been hemodynamically stable and the lab show a sodium of 138,  potassium 4.0, glucose 121, BUN 12, creatinine 0.87, white count of 8,  hemoglobin 11.5, hematocrit 34.4, platelet count of 126.  He has been  tolerating regular diet and has been comfortable.  Groin shows no  signs  of hematoma.  He has been ambulating and voiding and ultimately felt  appropriate for discharge home on October 30, 2007.  He currently is  in stable condition.   DISCHARGE MEDICATIONS:  1. Centrum.  2. Vitamin q.p.m.  3. Coreg 6.25 mg b.i.d.  4. Aspirin 81 mg q.p.m.  5. Colchicine 0.6 mg b.i.d. p.r.n.  6. Lipitor 10 mg p.o. q.p.m.  7. Amlodipine/benazepril 2.5/10 mg p.o. q.p.m.  8. CoQ10 1 p.o. q.p.m.  9. Percocet 5/325 mg 1 tablet p.o. q.4 h. p.r.n. pain.   DISCHARGE INSTRUCTIONS:  To continue heart-healthy diet.  May shower and  clean the incisions gently with soap and water and increase activity as  tolerated.  Avoid driving for the next couple of weeks and avoid heavy  lifting.  To follow up with Dr. Darrick Penna in approximately 4 weeks with a  CT scan.  He should call our office if he has fever greater than 101,  redness or drainage from the incision site, or other symptoms such as  increased pain or persistent nausea  or vomiting.      Jerold Coombe, P.A.      Janetta Hora. Fields, MD  Electronically Signed    AWZ/MEDQ  D:  10/30/2007  T:  10/31/2007  Job:  [id number]   cc:   Jonny Ruiz L. Rendall, M.D.  Pramod P. Pearlean Brownie, MD  Aida Puffer

## 2010-06-28 NOTE — Assessment & Plan Note (Signed)
OFFICE VISIT   SHAYMUS, EVELETH  DOB:  January 05, 1927                                       10/10/2007  IHKVQ#:[id number]   The patient returns for followup today for further evaluation of his  abdominal aortic aneurysm.  He had a CT angiogram of the abdomen and  pelvis today.  This confirmed that the aneurysm diameter was 5.1 cm in  size.  Evaluation of the anatomy of the aneurysm shows that the proximal  aortic neck just below the renal arteries is 22 mm in diameter and  tapers down to 19.8 mm in diameter just above the aneurysm.  The neck  length is approximately 3 cm.  There is mild right to left tortuosity  with approximately a 50 degree angle to the left.  There is also about  10 degrees of cranial caudal angulation.  Of note, the neck of the  aneurysm does have a moderate amount of calcium.  There is also some  calcium in the left common iliac artery.  Otherwise, external iliac  artery diameters and iliac lengths seem amenable to stent graft.   PHYSICAL EXAMINATION:  Blood pressure is 140/68 in the right arm, pulse  is 52 and regular.  Abdomen is soft and nontender.   I believe the patient's aneurysm should be amendable to stent graft  repair.  I will review his films with the [employer] in  preparation for this.  The patient says he wishes to think about this  for a few days to see whether or not he wishes to have the aneurysm  repaired at this time.  He would prefer stent grafting if possible.  I  informed him that if he decides to go forward and have the aneurysm  fixed that we would need to have him reviewed again by Dr. [place].  He  apparently has a scheduled appointment on September 9.  We will  tentatively plan to have his aneurysm repaired with stent graft if  approved by the [employer] company sometime after September 9.  If the patient  decides not to have the aneurysm repaired at this time I did inform him  the risk of rupture does tend  to go up after the aneurysm cross 5-5.5 cm  in diameter and he certainly would warrant close followup and need a  repeat ultrasound in 6 months.   Janetta Hora. Fields, MD  Electronically Signed   CEF/MEDQ  D:  10/11/2007  T:  10/11/2007  Job:  [id number]   cc:   [name] Little  Peter M. [place], M.D.

## 2010-06-28 NOTE — Procedures (Signed)
CAROTID DUPLEX EXAM   INDICATION:  Follow up left carotid endarterectomy.   HISTORY:  Diabetes:  No.  Cardiac:  CABG.  Hypertension:  Yes.  Smoking:  Yes.  Previous Surgery:  Left carotid endarterectomy with revision in 2006.  CV History:  Currently asymptomatic.  Amaurosis Fugax No, Paresthesias No, Hemiparesis No.                                       RIGHT             LEFT  Brachial systolic pressure:         162               160  Brachial Doppler waveforms:         Normal            Normal  Vertebral direction of flow:        Antegrade         Antegrade  DUPLEX VELOCITIES (cm/sec)  CCA peak systolic                   75                113  ECA peak systolic                   76                44  ICA peak systolic                   94                48  ICA end diastolic                   22                113  PLAQUE MORPHOLOGY:                  Heterogenous      Heterogenous  PLAQUE AMOUNT:                      Mild              Mild  PLAQUE LOCATION:                    ICA/ECA           CCA   IMPRESSION:  1. No hemodynamically significant stenosis of the right internal      carotid artery with mild plaque formations noted.  2. Patent left carotid endarterectomy site with no left internal      carotid artery stenosis.  3. No significant change noted when compared to the previous      examination on 09/02/2008.   ___________________________________________  Janetta Hora. Fields, MD   CH/MEDQ  D:  11/10/2009  T:  11/10/2009  Job:  [id number]

## 2010-06-28 NOTE — Procedures (Signed)
DUPLEX ULTRASOUND OF ABDOMINAL AORTA   INDICATION:  Follow up stent repair of an abdominal aortic aneurysm.   HISTORY:  Diabetes:  No.  Cardiac:  CABG.  Hypertension:  Yes.  Smoking:  Yes.  Connective Tissue Disorder:  Family History:  No.  Previous Surgery:  Stent repair of abdominal aortic aneurysm in 2009.   DUPLEX EXAM:         AP (cm)                   TRANSVERSE (cm)  Proximal             Not visualized            Not visualized  Mid                  Not visualized            Not visualized  Distal               6.1 cm                    6.2 cm  Right Iliac          Not visualized            Not visualized  Left Iliac           Not visualized            Not visualized   PREVIOUS:  Date: 09/02/2008 (CT)  AP:  5.3  TRANSVERSE:  5.2   IMPRESSION:  1. Significantly increased maximum diameters of the abdominal aortic      aneurysm sac when compared to the previous CT.  The known type II      endoleak, as noted on the last CT, was not adequately visualized      during this examination.  2. Unable to adequately visualize the proximal to mid abdominal aorta      and bilateral common iliac arteries due to overlying bowel gas      patterns.   ___________________________________________  Janetta Hora Fields, MD   CH/MEDQ  D:  11/10/2009  T:  11/10/2009  Job:  [id number]

## 2010-06-28 NOTE — Assessment & Plan Note (Signed)
OFFICE VISIT   [name], [name]  DOB:  Feb 27, 1926                                       09/02/2008  [id number]#:[id number]   The patient is an 77 year old male who returns today for followup after  aneurysm stent grafting.  He has also previously had left carotid  endarterectomy.  The endarterectomy was in 2006, aneurysm stent graft  was in September of 2009.  He denies any abdominal or back pain.  He  denies any symptoms of TIA, amaurosis or stroke.   PHYSICAL EXAM:  Blood pressure is 140/69 in the left arm, pulse is 47  and regular.  HEENT:  Unremarkable.  Neck:  Has 2+ carotid pulses  without bruit.  Chest:  Clear to auscultation.  Cardiac:  Regular rate  and rhythm.  Abdomen:  Soft, nontender, nondistended.  No pulsatile  masses.  One to 2+ femoral pulses bilaterally.  Feet are pink, warm and  well-perfused.   He had a CT angiogram of the abdomen and pelvis today which shows that  the aneurysm stent graft is in good position.  The aneurysm diameter is  5.3 cm in diameter.  There was a type 2 endo leak present.  The proximal  portion of the stent abuts the left renal artery which is the lowest  renal artery.   Of note, he was also found to have thickening of the ascending portion  of his colon.  This has been present on at least two CT scans now and it  was suggested that colonoscopy might be necessary to further evaluate  this to make sure there is no mass lesion.  I discussed this with the  patient today.  We have arranged for him to have a colonoscopy with  [facility] in the near future.  He will follow up with me in 1 year for  a repeat carotid duplex exam and an ultrasound of his abdominal aorta.   Janetta Hora. Fields, MD  Electronically Signed   CEF/MEDQ  D:  09/03/2008  T:  09/03/2008  Job:  [id number]   cc:   [name] Little  Peter M. [place], M.D.

## 2010-06-28 NOTE — Procedures (Signed)
CAROTID DUPLEX EXAM   INDICATION:  Follow-up of known carotid artery disease.  Patient is  currently asymptomatic.   HISTORY:  Diabetes:  No.  Cardiac:  CABG x5 in 2000.  Hypertension:  Yes.  Smoking:  Yes, a few cigars daily.  Previous Surgery:  Left CEA with DPA in 10/2004 by Dr. Darrick Penna.  Cardiac.  CV History:  CVA x3, three years ago.  Amaurosis Fugax No, Paresthesias No, Hemiparesis No                                       RIGHT             LEFT  Brachial systolic pressure:         154               152  Brachial Doppler waveforms:         Triphasic         Triphasic  Vertebral direction of flow:        Antegrade         Antegrade  DUPLEX VELOCITIES (cm/sec)  CCA peak systolic                   103, 154 (D)      101  ECA peak systolic                   130               42  ICA peak systolic                   147               (P) 57, (M) 86  ICA end diastolic                   42                21, 26  PLAQUE MORPHOLOGY:                  Mixed             Intimal thickening  PLAQUE AMOUNT:                      Moderate          Minimal  PLAQUE LOCATION:                    DCCA through PICA/ECA               CCA, bifurcation, ICA   IMPRESSION:  1. Elevated velocity noted at right distal common carotid artery.  2. Right 40-59% ICA stenosis.  3. Left ICA without recurrent stenosis, status post CEA.  4. Study essentially unchanged from that on 05/11/2006.   ___________________________________________  Janetta Hora. Fields, MD   PB/MEDQ  D:  09/26/2007  T:  09/26/2007  Job:  [id number]

## 2010-06-28 NOTE — Assessment & Plan Note (Signed)
OFFICE VISIT   [name], [name]  DOB:  05/18/1926                                       11/27/2007  [id number]#:[id number]   The patient returns for postoperative followup today after placement of  his aneurysm stent graft on 10/29/2007.  He states that he has been  doing well and wants to return to his normal activities.   PHYSICAL EXAMINATION:  On physical exam today blood pressure is 138/83,  pulse is 71 and regular.  Both groin incisions are healing well.  He has  2+ femoral pulses.  He has no pulsatile abdominal mass.   He had a CT angiogram performed today which shows that his aneurysm  stent graft was in good position with the top portion of the stent  approximately 2.5 mm below the left renal artery.  Of note, there is  also a small nodule in the left lingular area of his lung which is  stable from previous CT scans.  He was also noted to have a type 2  endoleak along the right side of the aneurysm sac near the right iliac  limb which apparently seemed to be coming from a lumbar artery.  The  aneurysm diameter is 5.4 cm.   Overall the patient is healing well.  His aneurysm is well excluded from  the stent graft.  He has a type 2 endoleak which is usually benign in  nature.  I discussed this with the patient today at length.  He will  follow up in 3 months' time for repeat CT angiogram.  I told him that he  could pretty much return to his normal activities at this point.   Janetta Hora. Fields, MD  Electronically Signed   CEF/MEDQ  D:  11/27/2007  T:  11/28/2007  Job:  [id number]   cc:   Aida Puffer

## 2010-06-28 NOTE — Assessment & Plan Note (Signed)
OFFICE VISIT   [name], [name]  DOB:  1926/09/13                                       02/26/2008  EAVWU#:[id number]   The patient returns for further followup today.  He was last seen in  October of 2009.  He had an aneurysm stent graft placed in September of  2009.  He previously had a left carotid endarterectomy in September of  2006.   He states that he has been doing well since then.  He has some decreased  energy level on some days.  He reports no symptoms of amaurosis, TIA or  stroke.  He has had no abdominal or back pain.  He still works out  fairly regularly at [employer]  He has noticed a bulge in his upper  abdomen recently.   His medications are essentially unchanged.   His atherosclerotic risk factors continue to include primarily  hypertension and elevated cholesterol.   PHYSICAL EXAMINATION:  Vital signs:  On physical exam today blood  pressure is 160/79 in the left arm, pulse 61 and regular.  HEENT:  Is  unremarkable.  He has 2+ carotid pulses without bruit.  Chest:  Clear to  auscultation.  Cardiac:  Regular rate and rhythm without murmur.  Abdomen:  Is soft, nontender, nondistended with no masses.  He does have  evidence of a hernia bulge just below the xiphoid process most likely  from a previous chest tube incision.  The hernia defect is approximately  3 x 3 cm in diameter.  It reduces spontaneously.  It is nontender.  He  has no masses in the abdomen.  He has 2+ femoral pulses bilaterally.   He had a CT scan of the abdomen and pelvis to reevaluate his abdominal  aortic aneurysm last week.  This shows the aneurysm is 5.3 x 5.1 cm in  diameter.  The top of the stent is just adjacent to the left renal  artery.  There is evidence of a type 2 leak which was also seen on  previous CT scan.  Overall the aneurysm stent graft is in good position  and there are no problems related to this.  The type 2 leak seems to be  benign at this  point.   Overall the patient seems to be doing well.  He will follow up in 6  months' time with a carotid duplex exam and a CT angiogram of the  abdomen and pelvis.  Once he gets to 1 year out from his placement of  his stent graft we will consider going to on a yearly basis at that  point.   Janetta Hora. Fields, MD  Electronically Signed   CEF/MEDQ  D:  02/26/2008  T:  02/27/2008  Job:  [id number]   cc:   [name] Little  Peter M. [place], M.D.

## 2010-06-28 NOTE — Assessment & Plan Note (Signed)
OFFICE VISIT   [name], [name]  DOB:  01-Sep-1926                                       11/11/2009  ZOXWR#:[id number]   The patient returns for routine followup from his aneurysm stent graft  today.  He recently underwent laparoscopic assisted colon resection by  Dr. Dwain Sarna several weeks ago and is still recovering somewhat from  this but overall feels like he is returning to his baseline.  His  abdominal aortic aneurysm was repaired in September of 2009 with a Gore  excluder graft.  The aneurysm was 5 cm in diameter at that time.  I  reviewed several of his CT scans today.  His aneurysm was 5.3 cm in July  of 2010, 5.7 cm in May of 2011 and on today's CT scan the aneurysm is  now 6.4 cm in diameter.  The top of the stent is right at the renal  origins and there does not appear to be any evidence of migration.  There does not appear to be any obvious evidence of a type 1 leak.  He  has had an intermittent type 2 leak along the right iliac limb.  He  denies any abdominal or back pain.   PHYSICAL EXAM:  Blood pressure is 144/80 in the left arm, heart rate is  93 and regular, oxygen saturation is 99%.  Abdomen has a well-healed  infraumbilical incision and port site incisions.  He has 2+ femoral  pulses bilaterally.   Chronic medical problems continue to remain coronary artery disease and  hypertension, all these are currently controlled.   Past surgical history is unchanged with coronary bypass in 2000,  tonsillectomy, hemorrhoidectomy, right knee replacement, his aneurysm  stent graft repair and his colon resection.   SOCIAL HISTORY:  He is married, has six children.  He is a retired  Medical illustrator.  Smokes five cigars per day.  Drinks 2-3 beers daily.  Full 12  point review of systems was performed with the patient today.  Please  see intake referral form for details regarding this.   FAMILY HISTORY:  Unremarkable.   ASSESSMENT:  The patient has a  slowly expanding abdominal aortic  aneurysm despite stent graft repair.  This most likely is due to a type  2 leak.  However, I believe he needs an arteriogram to make sure that he  does not have a leak from the right iliac limb of the graft.  We will  schedule his arteriogram for 11/19/2009.  If this is a problem with the  limb of the graft he would need a secondary intervention for this.  If  it appears to be a type 2 endo leak alone from a lumbar artery then we  would consider whether or not interventional radiology could embolize  this for [place] in the future.  All this was discussed with the patient  today in full detail.  Risks, benefits, possible complications and  procedure details of arteriogram were also explained to the patient  today.     Janetta Hora. Fields, MD  Electronically Signed   CEF/MEDQ  D:  11/11/2009  T:  11/12/2009  Job:  [id number]   cc:   Peter M. [place], M.D.  Fayrene Fearing Little

## 2010-06-28 NOTE — Procedures (Signed)
DUPLEX ULTRASOUND OF ABDOMINAL AORTA   INDICATION:  Follow up abdominal aortic aneurysm.   HISTORY:  Diabetes:  No.  Cardiac:  CABG in 2000.  Hypertension:  Yes.  Smoking:  Quit.  Connective Tissue Disorder:  Family History:  No.  Previous Surgery:  No AAA surgery.   DUPLEX EXAM:         AP (cm)                   TRANSVERSE (cm)  Proximal             1.97 Cm                   1.86 cm  Mid                  4.54 cm                   4.64 cm  Distal               1.68 cm                   1.78 cm  Right Iliac  Left Iliac   PREVIOUS:  Date: 11/03/2005  AP:  4.4  TRANSVERSE:  4.5   IMPRESSION:  The abdominal aortic aneurysm shows evidence of a very  slight increase in measurement.   ___________________________________________  Janetta Hora. Fields, MD   AS/MEDQ  D:  11/14/2006  T:  11/15/2006  Job:  [id number]

## 2010-06-28 NOTE — Procedures (Signed)
CAROTID DUPLEX EXAM   INDICATION:  Follow up known carotid artery disease.   HISTORY:  Diabetes:  No  Cardiac:  CABG 5 times  Hypertension:  Yes  Smoking:  Cigar  Previous Surgery:  Left carotid endarterectomy in 2006.  CV History:  No  Amaurosis Fugax No, Paresthesias No, Hemiparesis No                                       RIGHT             LEFT  Brachial systolic pressure:         110               120  Brachial Doppler waveforms:         biphasic          biphasic  Vertebral direction of flow:        antegrade         antegrade  DUPLEX VELOCITIES (cm/sec)  CCA peak systolic                   97                62  ECA peak systolic                   199               not well-  visualized  ICA peak systolic                   92                69  ICA end diastolic                   19                23  PLAQUE MORPHOLOGY:                  calcified         none  PLAQUE AMOUNT:                      moderate          none  PLAQUE LOCATION:                    ICA and ECA       None   IMPRESSION:  1. Normal carotid duplex noted in the left ICA.  Status post left      carotid endarterectomy.  2. A 20-39% stenosis noted in the right ICA.  3. Antegrade bilateral vertebral arteries.   ___________________________________________  Janetta Hora Fields, MD   MG/MEDQ  D:  09/02/2008  T:  09/03/2008  Job:  [id number]

## 2010-06-29 ENCOUNTER — Other Ambulatory Visit: Payer: Self-pay | Admitting: Vascular Surgery

## 2010-06-29 DIAGNOSIS — I714 Abdominal aortic aneurysm, without rupture: Secondary | ICD-10-CM

## 2010-07-01 NOTE — Op Note (Signed)
NAME:  ENGLISH, CRAIGHEAD NO.:  [id number]   MEDICAL RECORD NO.:  [id number]          PATIENT TYPE:  INP   LOCATION:  2316                         FACILITY:  [facility]   PHYSICIAN:  Janetta Hora. Fields, MD  DATE OF BIRTH:  Oct 31, 1926   DATE OF PROCEDURE:  11/02/2004  DATE OF DISCHARGE:                                 OPERATIVE REPORT   PROCEDURE:  Evacuation of hematoma, left neck.   PREOPERATIVE DIAGNOSIS:  Hematoma, left neck.   POSTOPERATIVE DIAGNOSIS:  Hematoma, left neck.   ANESTHESIA:  General.   SURGEON:  Mikhai E. Fields, MD   ASSISTANT:  Nurse.   INDICATIONS:  The patient is 76 year old male who is status post carotid  endarterectomy with re-exploration approximately 24 hours ago.  He has  developed a moderate-sized hematoma on the left side of his neck.  There is  currently an airway compromise.  He does have some swallowing difficulty.   OPERATIVE FINDINGS:  1.  Hematoma, left neck, with no active source of bleeding.  2.  A 10 flat JP placed in left neck.   OPERATIVE DETAILS:  After obtaining informed consent, the patient was taken  to the operating room.  The patient was placed in supine position on the  operating table.  After induction of general anesthesia and endotracheal  intubation, the patient's left neck and chest were prepped and draped in  usual sterile fashion.  The preexisting left neck incision was reopened and  carried down through subcutaneous tissues.  Platysma was reopened.  There  was a moderate-sized hematoma within the left neck.  It was not under  pressure.  The hematoma was evacuated and thoroughly irrigated with normal  saline solution.  The entire suture line of the patch was inspected and  found to have no active points of bleeding.  The hematoma was removed and  all surrounding thrombus removed completely.  The wound was then thoroughly  irrigated with a liter of normal saline solution.  A 10 flat JP was then  brought out  through the inferior aspect of the wound.  Doppler was used to  inspect the carotid and there was good flow through the common, external and  internal carotid arteries.  Next, the platysma was reapproximated using a  running 3-0 Vicryl suture.  Skin was closed with a 4-0 Vicryl subcuticular  stitch.  The patient tolerated the procedure well and there were no  immediate complications.  Instrument,  sponge and needle count was correct  at the end of the case.  The patient was taken to the recovery room in  stable condition.          ______________________________  Janetta Hora Fields, MD    CEF/MEDQ  D:  11/02/2004  T:  11/03/2004  Job:  [id number]

## 2010-07-01 NOTE — Op Note (Signed)
NAME:  [name], [name] NO.:  [id number]   MEDICAL RECORD NO.:  [id number]          PATIENT TYPE:  INP   LOCATION:  2316                         FACILITY:  [facility]   PHYSICIAN:  Janetta Hora. Fields, MD  DATE OF BIRTH:  January 21, 1927   DATE OF PROCEDURE:  11/01/2004  DATE OF DISCHARGE:                                 OPERATIVE REPORT   PROCEDURE:  1.  Re-exploration of left carotid.  2.  Revision of distal endarterectomy.   SURGEON:  Evrett Hakim. Fields, MD   PREOPERATIVE DIAGNOSIS:  Neurologic deficit, post carotid endarterectomy.   POSTOPERATIVE DIAGNOSIS:  Neurologic deficit, post carotid endarterectomy.   INDICATIONS:  The patient is a 77 year old male who is status post left  carotid endarterectomy earlier today.  He was noted to develop slurred  speech and mild right facial droop in the recovery room.  He was taken back  to the operating room emergently for re-exploration of his carotid.   OPERATIVE FINDINGS:  1.  No thrombus.  2.  Rough distal endpoint with small intimal flap.  3.  Resection of distal endpoint and repair.  4.  Dacron patch.  5.  A 10-French shunt.   OPERATIVE DETAILS:  After obtaining informed consent, the patient was taken  to the operating room.  The patient was placed in supine position on the  operating table.  After induction of general anesthesia and endotracheal  intubation, the patient's left neck incision was reopened.  The sutures were  taken out of the skin and platysmal muscle.  The carotid was exposed.  It  was patent by palpation.  The common carotid artery was dissected free  circumferentially and umbilical tape placed around this.  The distal  internal carotid artery was dissected free circumferentially and a Vesseloop  placed around this.  Vesseloops were also placed around the external carotid  and superior thyroid artery.  The patient was given 5000 units of  intravenous heparin.  The distal internal carotid artery was  clamped with a  fine bulldog clamp and the Vesseloops were pulled taut on the external and  superior thyroid arteries.  The common carotid artery was controlled with a  peripheral DeBakey clamp.  An 11 blade was used to take the previously  placed Dacron patch off the artery.  There was no thrombus within the  arterial lumen.  The distal endpoint was slightly rough with a small intimal  flap.  An attempt was made to tack this down, but the endpoint still had a  roughened appearance.  Therefore, a 10-French shunt was brought up on the  operative field; this was then threaded into the distal internal carotid  artery and allowed to back-bleed.  This was then threaded down to the common  carotid artery and controlled with a Rumel tourniquet.  The distal shunt was  controlled with a Javid shunt clamp.  The internal carotid artery was  dissected free approximately a centimeter and half higher than at the  previous dissection.  The clamp was moved up.  The arteriotomy was then  opened slightly more to allow good inspection  of the distal endpoint.  This  area was slightly rough and could not be repaired with tacking sutures.  Therefore, a few millimeters of the distal endpoint were completely  resected.  The carotid was then repaired end-to-end on the posterior wall  using a running 7-0 Prolene suture.  After the posterior wall had been  reapproximated, a new Dacron patch was brought on the operative field and  this sewn on as a patch angioplasty using a running 6-0 Prolene suture.  Just prior to completion of the anastomosis, this was thoroughly irrigated  with heparinized saline.  Everything was back-bled, fore-bled and thoroughly  flushed.  The anastomosis was secured and the clamp first released into the  external carotid artery and then after 5 cardiac cycles, into the internal  carotid artery.  This was all performed after the shunt had been removed.  Carotid was inspected with a Doppler and  the flow was found be good through  the internal carotid artery, external carotid artery and common carotid  artery.  Hemostasis was obtained.  Platysma was then reapproximated using a  running 3-0 Vicryl suture.  The skin was closed with a 4-0 Vicryl  subcuticular stitch.  The patient tolerated the procedure well and there  were no immediate complications.  At the end of the culture and sensitivity,  one 7-0 Prolene needle was missing.  An x-ray was performed in the operating  room.  The patient was taken to the recovery room in stable condition.  Neurologically, he was moving his upper and lower extremities symmetrically  at the end of the case.           ______________________________  Janetta Hora. Fields, MD     CEF/MEDQ  D:  11/01/2004  T:  11/02/2004  Job:  [id number]

## 2010-07-01 NOTE — Discharge Summary (Signed)
NAME:  JAKOBY, MELENDREZ              ACCOUNT NO.:  [id number]   MEDICAL RECORD NO.:  [id number]          PATIENT TYPE:  INP   LOCATION:  1611                         [facility]ACILITY:  [facility]   PHYSICIAN:  John L. Rendall, M.D.  DATE OF BIRTH:  Jan 14, 1927   DATE OF ADMISSION:  09/03/2006  DATE OF DISCHARGE:  09/07/2006                               DISCHARGE SUMMARY   /   ADMISSION DIAGNOSES:  1. End-stage osteoarthritis right knee.  2. Coronary artery disease, with history of coronary artery bypass      graft of 5 vessels.  3. Hypertension.  4. History of cerebrovascular accidents x3, now status post left      carotid endarterectomy.  5. History of gout.   DISCHARGE DIAGNOSES:  1. End-stage osteoarthritis right knee, status post right total knee      arthroplasty.  2. Acute blood loss anemia secondary to surgery.  3. Constipation.  4. Near-syncopal episode, now resolved.  5. Early gout flare.  6. Coronary artery disease, with history of 5-vessel coronary artery      bypass graft.  7. Hypertension.  8. History of 3 strokes, status post carotid endarterectomy.   SURGICAL PROCEDURES:  On September 03, 2006, Mr. Vanecek underwent a right  total knee arthroplasty with computer navigation by Dr. Jonny Ruiz L.  Rendall, assisted by Jacqualine Code, PA-C.  He had an LCS complete  primary femoral component placed, large right, with an LCS complete RP  insert, size large, 10 mm thickness.  A DePuy NBT keel tibial tray  cemented size 5, and an LCS complete metal back patella cemented size  large.   COMPLICATIONS:  None.   CONSULTS:  1. Physical therapy consult September 04, 2006, in addition to a case      management consult.  2. Occupational therapy consult, September 05, 2006.   HISTORY OF PRESENT ILLNESS:  This 77 year old white male patient  presented to Dr. Priscille Kluver with a history of chronic right knee pain which  has gotten worse over the last several months.  Pain is constant,  moderately severe.  It  seems to be worse with activities and is causing  him to limit his activities.  X-rays show end-stage arthritic changes,  and because of that he is presenting for a right knee replacement.   HOSPITAL COURSE:  Mr. Murin tolerated his surgical procedure well,  without immediate postoperative complications.  He was transferred to  the orthopedic floor.  On postop day #1, he was afebrile, and vitals  were stable.  Hemoglobin 10.7, hematocrit 30.4.  UA preoperatively had  grown 10,000 colonies/mL of Enterococcus, but otherwise was negative.  Leg was neurovascularly intact.  He was weaned off his oxygen and  started on therapy per protocol.   On postop day #2, he remained afebrile, vitals stable.  Hemoglobin 9.7,  hematocrit 27.7.  Repeat UA showed negative nitrites, trace leukocyte  esterase, 3-6 white cells, 21-50 RBCs, few epithelials, and a large  amount of hemoglobin.  He was otherwise asymptomatic.  Knee incision was  changed.  It was well approximated with staples.  He was switched  to  p.o. pain medications.  O2 saturations were monitored, and he was  continued on therapy.  He did have a near-syncopal episode that day, and  it was treated effectively with getting him back to bed.   On postop day #3, he was feeling better but still a little weak.  He was  voiding without difficulty.  He had required two I&O catheterizations  the day before.  He was afebrile, but blood pressure was a bit low at  99/59.  Hemoglobin was 9.5, hematocrit 27.3.  He was subsequently  transfused with 1 unit of packed red blood cells.  He complains of  symptoms of his gout, and so he had been restarted on colchicine.  He  was continued on therapy.   On postop day #4, he was feeling much better and was ready for  discharge.  His BP had improved to 138/70, vitals were stable.  Pain was  well controlled with p.o. medications.  It was felt he was ready for  discharge home and was discharged home later that  day.   DISCHARGE INSTRUCTIONS:   DIET:  He can resume his regular prehospitalization diet.   MEDICATIONS:  He may resume his prehospitalization medications except  for his aspirin.   HOME MEDICATIONS:  1. Lipitor 10 mg p.o. q.p.m.  2. Amlodipine 2.5/10, 1 tablet p.o. q.p.m.[name]  3. Metoprolol 100 mg p.o. q.a.m.[name]  4. Colchicine 0.6 mg p.o. b.i.d. p.r.n.  5. Probenecid 500 mg p.o. p.r.n.  6. Aspirin 81 mg p.o. q.p.m.  He is to stop this now, but can restart      on September 10, 2006.   ADDITIONAL MEDICATIONS:  1. Arixtra 2.5 mg subcutaneously q.9 p.m., with the last dose to be on      September 09, 2006, and again start the aspirin on September 10, 2006.  He is      given #3, with no refill.  2. Norco 5/325, 1-2 tablets p.o. q.4 h p.r.n. for pain, #60, with no      refill.  3. Robaxin 500 mg 1-2 tablets p.o. q.6 h p.r.n. for spasms, #40, with      no refill.   ACTIVITY:  He can be out of bed, weightbearing as tolerated.  He is to  have CPM 0-90 degrees 6-8 hours a day, maximum 110.  He is to have home  health PT per [facility].  Please see the blue total knee  discharge sheet for further activity instructions.   WOUND CARE:  He is to keep the incision clean and dry and change the  dressing daily.  He may shower after no drainage from the wound for 2  days.  Please see the blue total knee discharge sheet for further wound  care instructions.   FOLLOWUP:  He is to follow up with Dr. Priscille Kluver in our office  approximately 2 weeks from surgery, and needs to call [phone] for that  appointment.   LABORATORY DATA:  Hemoglobin and hematocrit ranged from 13.1 and 37.8 on  August 28, 2006 to a low of 9.5 and 27.3 on September 06, 2006.  White count  remained within normal limits.  Platelets ranged from 173 on August 28, 2006 to a low of 128 on September 05, 2006.   Glucose ranged from 105 on August 28, 2006 to 136 on September 04, 2006, to 124  on September 06, 2006.   UA on September 04, 2006 showed large  hemoglobin, 30 mg/dL  protein, trace  leukocyte esterase, few epithelials, 3-6 white cells, 21-50 red cells.  All other laboratory studies were within normal limits.      Legrand Pitts Duffy, P.A.      John L. Rendall, M.D.  Electronically Signed    KED/MEDQ  D:  09/19/2006  T:  09/20/2006  Job:  [id number]

## 2010-07-01 NOTE — Op Note (Signed)
NAME:  [name], [name] NO.:  [id number]   MEDICAL RECORD NO.:  [id number]          PATIENT TYPE:  INP   LOCATION:                               FACILITY:  [facility]   PHYSICIAN:  Janetta Hora. Fields, MD  DATE OF BIRTH:  1926/06/30   DATE OF PROCEDURE:  11/01/2004  DATE OF DISCHARGE:                                 OPERATIVE REPORT   PROCEDURE:  Left carotid endarterectomy.   PREOPERATIVE DIAGNOSIS:  Symptomatic left internal carotid artery stenosis.   POSTOPERATIVE DIAGNOSIS:  Symptomatic left internal carotid artery stenosis.   ANESTHESIA:  General.   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 Preoperative duplex examination  shows greater than 50% left internal carotid artery stenosis. Preoperative  MRA shows greater than 75% left internal carotid artery stenosis.   OPERATIVE FINDINGS:  1.  Dacron patch.  2.  High bifurcation with distal extent of patch 20% with 20% of patch above      the hypoglossal nerve.   OPERATIVE DETAILS:  After obtaining informed consent, the patient was taken  to the operating room. The patient was placed in supine position on the  operating table. After induction of general anesthesia and placement of  endotracheal tube a Foley catheter was placed.   Next, the patient's entire left neck and chest were prepped and draped in  the usual sterile fashion. An oblique incision was made on the anterior  border of the left sternocleidomastoid muscle. Incision was carried down  through the subcutaneous tissues. Platysma was also incised. Dissection then  proceeded along the anterior border of the sternocleidomastoid muscle. This  was reflected laterally. Internal jugular vein was also reflected laterally.  Common facial vein was very high and this was then dissected free and  circumferentially ligated between silk ties and divided.  Dissection then  proceeded down onto level of the common carotid artery. Common carotid  artery was dissected free circumferentially and an umbilical tape placed  around this. Vagus nerve was identified, protected from harm's way.   Dissection then proceeded up the carotid sheath at the level carotid  bifurcation. This was heavily calcified. The lesion extended quite high. At  this point the patient given 5000 units of intravenous heparin. The external  carotid and superior thyroid arteries were dissected free circumferentially.  The distal internal carotid artery dissection proceeded distally requiring  full mobilization of the hypoglossal nerve as well as division of the  posterior belly of the digastric muscle to provide exposure. Additionally  two branches of the external carotid artery were also divided in order to  fully mobilize the distal internal carotid artery. The vessel loop was  placed around this. Hypoglossal nerve was protected from harm's way but was  retracted proximal and distal in order to assist in exposure.   Next, the patient was given an additional 2000 units of intravenous heparin.  The distal internal carotid artery was clamped. The superior thyroid and  external carotid arteries were controlled by vessel  loops. The common  carotid artery was then clamped with a peripheral DeBakey clamp. A  longitudinal arteriotomy was made just below the level of the carotid  bifurcation. Potts scissors were used to extend the arteriotomy through the  level of plaque, approximately 70% stenosis. There was a deep ulcerated  plaque with a large amount of debris within its cul-de-sac. The arteriotomy  was extended up to the level of the end of the plaque which was, again,  above the level of the hypoglossal nerve.   Endarterectomy was then begun in a suitable plane. Superior thyroid and  external carotid arteries were endarterectomized with eversion technique.  Plaque was then  removed from the distal internal carotid artery at a point  where a suitable endpoint was obtained. The internal carotid artery was  fairly thinned out up near the distal portion of the endarterectomy, and two  7-0 Prolene sutures were used to tack the posterior wall and repair this  thinned-out area on the posterior wall of the carotid artery. All loose  debris was thoroughly irrigated and removed from the carotid. A Dacron patch  was then brought up on the operative field and sewn on as a patch  angioplasty using running a 6-0 Prolene suture. The patch was tunneled  underneath the hypoglossal nerve.   Just prior completion of the anastomosis, the shunt was clamped. Shunt was  removed from the distal ICA and the distal ICA allowed to backbleed  thoroughly. This was then clamped with a fine bulldog clamp. Shunt was then  removed from the proximal common carotid artery; and the common carotid  artery reclamped with a peripheral DeBakey clamp. This was also flushed  forward thoroughly. External carotid artery was backbled thoroughly. Artery  was then again thoroughly irrigated with heparinized saline. The anastomosis  was completed and secured. Flow was first restored then to the external  carotid artery; and after approximately five cardiac cycles to the internal  carotid artery. Doppler was then used to inspect the carotid.  There was  good biphasic flow through the internal carotid, external carotid, and  common carotid arteries. Hemostasis was obtained with two repair sutures  near the distal end of the anastomosis.   Next, a 10, flat JP was brought out through a separate stab incision at the  inferior aspect of the incision. A small portion of the tail of the parotid  gland was partly incised with cautery. A drain was placed for any leakage  from this.   Next the platysma was reapproximated using running 3-0 Vicryl suture. Skin was then closed with a 4-0 Vicryl subcuticular stitch.  Drain was sutured to  the skin with a nylon stitch. The patient tolerated the procedure well; and  there were no complications. Instruments, sponge, and needle counts were  correct at the end of the case. The patient was awakened in the operating  room, found be neurologically intact, and taken to recovery in stable  condition.           ______________________________  Janetta Hora Fields, MD     CEF/MEDQ  D:  11/01/2004  T:  11/01/2004  Job:  [id number]

## 2010-07-01 NOTE — Assessment & Plan Note (Signed)
OFFICE VISIT   [name], [name]  DOB:  08/24/1926                                       11/11/2009  ZOXWR#:[id number]   Patient had an abdominal aortogram performed on 11/19/2009.  He did have  2 small lumbar arteries which were visualized, but these did not appear  to fill the aneurysm sac.  He did not have any type 1, type 3, or type 4  endoleak.  He was evaluated by Dr. Ruel Favors several days later from  interventional radiology for consideration for direct cannulation of the  aneurysm sac and coil embolization of these 2 small lumbar arteries.  However, on Dr. Chester Holstein review, the aneurysm diameter he did not feel  had changed significantly.  It was thought that the aneurysm recently  had been measured on an oblique diameter other than that the similar  diameter as previously.   In light of this, we have deferred treatment of his endoleak for now.  He will follow up in 6 months' time for repeat CT angiogram.  We will  continue to follow closely his aneurysm diameter.  If this continues to  expand, we will need to consider embolization at some point in the  future.     Janetta Hora. Fields, MD  Electronically Signed   CEF/MEDQ  D:  11/25/2009  T:  11/25/2009  Job:  [id number]   cc:   [name] Little  Peter M. [place], M.D.

## 2010-07-01 NOTE — Consult Note (Signed)
NAME:  [name], TIGGES NO.:  [id number]   MEDICAL RECORD NO.:  [id number]          PATIENT TYPE:  INP   LOCATION:  3009                         FACILITY:  [facility]   PHYSICIAN:  Vesta Mixer, M.D. DATE OF BIRTH:  11/16/1926   DATE OF CONSULTATION:  10/30/2004  DATE OF DISCHARGE:                                   CONSULTATION   [name] is an elderly gentleman with a history of coronary artery  disease.  He was admitted with a left hemispheric stroke.  He was found to  have a tight left carotid artery.  We were asked to see him to evaluate him  prior to carotid endarterectomy.   The patient has a long history of coronary artery disease.  He is status  post coronary artery bypass grafting in 2000.  He has not been seen in our  office in the past four years.   He has been followed by Dr. Lesly Rubenstein.  He has not had any symptoms.  He  specifically denies any chest pain or shortness of breath.  He has been able  to do all of his normal activities without any significant problems.   He presented to the emergency department with right hand sluggishness.  He  was found to have had a left hemispheric CVA and further work-up revealed a  tight left carotid artery.   CURRENT MEDICATIONS:  1.  Aggrenox once a day.  2.  Toprol XL once a day.  3.  Zyloprim 300 mg a day.  4.  Probenecid 500 mg twice a day.   ALLERGIES:  No known drug allergies.[name]   PAST MEDICAL HISTORY:  1.  Coronary artery disease - status post coronary artery bypass grafting.  2.  History of CVA.  3.  History of gout.  4.  Hypercholesterolemia.   SOCIAL HISTORY:  The patient has a history of cigarette smoking.  He drinks  beer occasionally.   FAMILY HISTORY:  Positive for cardiac disease.   PHYSICAL EXAMINATION:  GENERAL APPEARANCE:  He is an elderly gentleman in no  acute distress. He is alert and oriented x3 and his mood and affect are  normal.  VITAL SIGNS:  His heart rate is 57, blood  pressure 140/82, temperature 97.  HEENT:  There are 2+ carotids, he has bilateral carotid bruits.  He has no  JVD.  LUNGS:  Clear.  CARDIOVASCULAR:  Regular rate, S1 and S2.  He has a 2/6 systolic ejection  murmur at the left sternal border.  ABDOMEN:  Good bowel sounds and nontender.  EXTREMITIES:  No edema.  NEUROLOGIC:  As per Dr. Marlis Edelson notes.  He does not have much in the way of  residual deficit.   EKG reveals sinus bradycardia with no ST or T-wave changes.   His echocardiogram  reveals a calcified aortic valve with mild left  ventricular systolic function.  There is no significant aortic stenosis.   ASSESSMENT/PLAN:  Cardiac risks.  The patient should be at relatively low  risk for his upcoming surgery, but we will probably need to do an adenosine  Cardiolite study.  It has been approximately four years since we have seen  him and really do not have a good gauge on his symptoms.   I do not find the official echo report in the computer, but by reports on  the chart, the aortic stenosis does not sound severe.  We will need to check  the echocardiogram  to verify that the aortic stenosis is not severe.  He  does have mildly depressed left ventricular systolic function but this also  should not be severe enough to cause any significant risk during his carotid  endarterectomy.  Dr. Sw[place]il[place] will see him in the morning and will follow up  with the above noted studies.           ______________________________  Vesta Mixer, M.D.     PJN/MEDQ  D:  10/30/2004  T:  10/31/2004  Job:  [id number]   cc:   Aida Puffer  [address]  [place]  [place] [address]  Fax: [phone]   Peter M. [place], M.D.  [address], [address]  [place], [place] [address]  Fax: [phone]   Jaylyn Booher. Fields, MD  [address]  [place], [place] [address]   Pramod P. Pearlean Brownie, MD  Fax: [phone]

## 2010-07-01 NOTE — H&P (Signed)
NAME:  [name], [name]              ACCOUNT NO.:  [id number]   MEDICAL RECORD NO.:  [id number]          PATIENT TYPE:  INP   LOCATION:  1827                         FACILITY:  [facility]   PHYSICIAN:  Pramod P. Pearlean Brownie, MD    DATE OF BIRTH:  1926/09/08   DATE OF ADMISSION:  10/28/2004  DATE OF DISCHARGE:                                HISTORY & PHYSICAL   REFERRING PHYSICIAN:  Carleene Cooper, M.D.   REASON FOR REFERRAL:  Stroke.   HISTORY OF PRESENT ILLNESS:  [name] is a 77 year old Caucasian male who  states he woke up at 6:30 this morning with right-sided weakness.  He  noticed that his right arm was sluggish and felt heavy when he was shaving.  His wife also noticed that he was dragging the right foot while walking.  He  denies any headaches, blurred vision, slurred speech or double vision.  He  denies any prior history of stroke, TIA or significant neurological  problems.   PAST MEDICAL HISTORY:  1.  Hypertension.  2.  Gout.  3.  Ischemic heart disease.  4.  Abdominal aortic aneurysm.   PAST SURGICAL HISTORY:  1.  Cardiac bypass surgery in 2000.  2.  Hemorrhoid surgery.   MEDICATION ALLERGIES:  None known.   HOME MEDICATIONS:  1.  Toprol XL 50 mg a day.  2.  Probenecid 500 mg twice a day and p.r.n.  3.  Allopurinol 300 mg once a day p.r.n.   SOCIAL HISTORY:  The patient is married.  Lives with his wife in [place].  He  quit smoking 33 years ago.  He drinks two six-pack beers per week.  Denies  doing drugs.   FAMILY HISTORY:  Significant for a stroke in a brother and MI in two  brothers.   REVIEW OF SYSTEMS:  Not significant for any chest pain, fever, cough,  shortness of breath, diarrhea.   PHYSICAL EXAMINATION:  GENERAL:  The patient is pleasant, awake, alert,  cooperative.  VITAL SIGNS:  He is afebrile, pulse rate is 62 per minute, regular sinus  rhythm, blood pressure 144/78, respiratory rate 20 per minute.  Distal  pulses well-felt.  HEENT:  Head is  nontraumatic.  NECK:  Supple without bruit.  CARDIAC:  No murmur or gallop.  Cardiac surgery is noted in the midline.  ABDOMEN:  Soft, nontender.  LUNGS:  Clear to auscultation.  NEUROLOGIC:  The patient is pleasant, awake, alert, cooperative.  There is  no aphasia, apraxia or dysarthria.  Pupils are equally reactive to light and  accommodation.  Visual acuity and fields are accurate.  Face is symmetric,  bilateral movements and normal.  Tongue is midline.  Motor system exam  reveals a very subtle right upper extremity drift.  He has weakness of the  intrinsic hand muscles on the right.  Fine grip is weak.  Finger-to-nose  coordination is accurate.  He has good strength at the shoulder and elbow  muscles.  Right lower extremity strength testing is symmetric though on  double simultaneous testing there is a mild weakness of the right hip  flexors  and ankle dorsiflexors.  Deep tendon reflexes are 2+, symmetric, and  toes are downgoing.  Sensation is grossly intact.  His gait was not tested.   DATA REVIEWED:  On noncontrast CT scan of the head today, there was no acute  infarct.  An area of encephalomalacia is noted in the left frontal region,  which likely represents either an old infarct or a remote injury.  Admission  labs are pending at this time.   IMPRESSION:  A 77 year old gentleman with sudden onset of right-sided  weakness upon arising from sleep, likely due to a small left hemispheric  subcortical infarction, etiology probable small-vessel disease.  CT scan  also shows a silent left frontal infarct as well.   PLAN:  The patient is being admitted to the stroke service for risk  stratification and workup.  He will be monitored on telemetry, and we will  check an MRI scan of the brain with MRI of the brain.  Next, carotid and  transcranial Doppler studies and 2-D echocardiogram.  Check fasting lipid  profile, hemoglobin A1c and homocysteine.  Physical and occupational therapy   consults.  I had a long discussion with the patient and his wife regarding  his symptoms.  Discussed the plan for evaluation, treatment, and answered  questions.           ______________________________  [name]. Pearlean Brownie, MD     PPS/MEDQ  D:  10/28/2004  T:  10/28/2004  Job:  [id number]   cc:   Aida Puffer, M.D.  [place], [place]

## 2010-07-01 NOTE — Discharge Summary (Signed)
NAME:  REEVE, TURNLEY NO.:  [id number]   MEDICAL RECORD NO.:  [id number]          PATIENT TYPE:  INP   LOCATION:  2016                         FACILITY:  [facility]   PHYSICIAN:  Janetta Hora. Fields, MD  DATE OF BIRTH:  1926/07/02   DATE OF ADMISSION:  10/28/2004  DATE OF DISCHARGE:  11/11/2004                                 DISCHARGE SUMMARY   ADMISSION DIAGNOSIS:  Left brain stroke (left subcortical infarction with  right upper extremity weakness.   DISCHARGE DIAGNOSIS:  1.  Left brain cerebrovascular accident secondary to left internal carotid      artery stenosis.  2.  Perioperative cerebrovascular accident with right facial droop and      dysarthria, improving.  3.  Hypertension, uncontrolled.  4.  Postoperative left neck hematoma requiring evacuation in the operating      room.  5.  Postoperative hypokalemia, improved.  6.  Postoperative arrhythmias, including transient SVT and ventricular      tachycardia, asymptomatic.  7.  Gout.  8.  Mild postoperative acute blood loss anemia, stable.  9.  Mild dysphagia secondary to perioperative cerebrovascular accident,      improving.  10. History of coronary artery disease status post coronary artery bypass      grafting in 2000.  11. History of 4 cm abdominal aortic aneurysm.  12. History of hemorrhoidectomy.  13. 50-pack-year history of smoking but quit 33 years ago.  14. No known drug allergies.   PROCEDURES/DIAGNOSTICS:  1.  November 01, 2004, left carotid endarterectomy with Dacron patch      angioplasty, by Dr. Fabienne Bruns.  2.  November 01, 2004, re-exploration of left carotid with revision of      distal endarterectomy, by Dr. Fabienne Bruns.  3.  November 02, 2004, evacuation of hematoma, left neck, by Dr. Fabienne Bruns.  4.  November 01, 2004, Adenosine Cardiolite within normal limits, by Dr.      Peter Swaziland.  5.  October 28, 2004, right carotid duplex showing 40-60% (read as  greater      than 70% by Dr. Darrick Penna) left internal carotid artery stenosis, common      carotid artery with significant plaque visualized on the right, unable      to determine degree of stenosis due to tortuosity but appeared severe in      range, difference in brachial pressures and wave forms but no flow      visualized in the vertebral suggests left subclavian stenosis/occlusion.  6.  October 28, 2004, 2D echocardiogram showing left ventricular ejection      fraction approximately 45-50% with hypokinesis in the inferior and      posterior walls, left ventricular wall thickness was mildly increased,      aortic valve was thick and calcified with minimal restricted motion, the      mean transaortic valve gradient was 9 mmHg, estimated aortic valve area      by VTI was 1.76 cm square, estimated aortic valve area by VMAX was 1.72      cm square, right ventricular  systolic function was mildly reduced.  7.  October 28, 2004, MRI/MRA of the brain, head and neck with results      showing acute small nonhemorrhagic infarction along the posterior left      frontal lobes forming the parietal lobes.  Atrophy without      hydrocephalus, shift, or intracranial hemorrhage.  Moderate to marked      small vessel disease type changes.  Moderate old infarction left frontal      lobe with encephalomalacia.  Significant atherosclerotic type changes      along the carotid bifurcation bilaterally and possibly involving the      proximal left vertebral artery.  8.  November 01, 2004, head CT without contrast showing no CT evidence of      acute intracranial abnormality, there is evidence of remote left frontal      infarction.   CONSULTATIONS:  1.  [facility], Dr. Kristeen Miss.  2.  Neurology, Dr. Delia Heady  3.  Speech pathology.  4.  Occupational therapy.  5.  Physical therapy.   BRIEF HISTORY:  Mr. Socarras is a 77 year old Caucasian male who is right  handed with a history of  hypertension and coronary artery disease status  post CABG in 2000 who, on the morning of September 15 around 6:30 noticed  that his right arm was sluggish and he was unable to shave properly.  He  also noted during his routine stretching exercises that his coordination was  not as usual.  He denied any difficulty with speech or vision.  He did not  have any facial droop or loss of sensation.  This is the first time he had  had symptoms such as these.  He also denied fever, nausea, vomiting,  diarrhea, chest pain, shortness of breath.  After workup in the emergency  department, he was diagnosed with a left subcortical infarction and was  admitted to stroke service.   HOSPITAL COURSE:  On October 28, 2004, Mr. Eckrich was admitted to [facility] for further treatment of a left brain stroke.  He was started  on IV heparin and workup for possible stroke source was initiated.  He  ultimately underwent MRI/MRA, carotid duplex, and 2D echocardiogram with  results as discussed above.  Findings showed severe left internal carotid  artery stenosis and a CVTS consult was ordered and the patient was seen by  Dr. Fabienne Bruns.  After examining the patient and review of his workup  findings at this time, Dr. Darrick Penna did feel that left carotid endarterectomy  was the best treatment choice to decrease the risks for future stroke.  Surgery was scheduled for November 01, 2004.  In the meantime, he was  evaluated by occupational and physical therapy who felt he would not need  further treatment.  By this time, his right upper extremity weakness had  nearly resolved.  In addition, he was seen by cardiology for cardiac  clearance due to his history of coronary artery disease as well as  uncontrolled hypertension.  He underwent Adenosine Cardiolite study which  showed no ischemia.  On November 01, 2004, Mr. Onstott was taken to the  operating room and did undergo left carotid  endarterectomy. Postoperatively, he was extubated and transferred to the recovery unit,  however, while in recovery, it was noticed that he was having worsening  confusion as well as right facial droop and slurred speech.  He was taken  emergently back to the operating room for re-exploration.  Findings showed  no thrombus with a rough distal endpoint with small flap which required  resection of the distal endarterectomy site and replacement of Dacron patch.  A shunt was used interoperatively.  Postoperatively, he was able to move  upper and lower extremities symmetrically but still with slurred speech and  mild right facial droop.  Head CT was obtained which showed no acute bleed.  Systolic blood pressure was elevated between 190 and 200 and he was started  on Nipride drip.  Heparin drip was also restarted at 500 units per hour and  the patient was transferred to the surgical intensive care unit.   On postoperative day one, he was still with dysarthria with, overall, better  blood pressure control with systolic ranging between 140 and 160.  On exam,  he was noted to have left neck moderate hematoma with no airway compromise.  Upper and lower extremity motor strength 5/5 but still with right hand  incoordination as prior to stroke.  There was also mild tongue deviation.  Due to the hematoma, it was felt that he should be taken back to the  operating room for evacuation.  Following surgery, he was returned to the  surgical intensive care unit.  A 10 French Jackson-Pratt drain was placed  interoperatively and remained for the next several days.  Mr. Castiglia  remained in the surgical intensive care unit for the following week.  During  this time frame, he was evaluated by speech therapy.  Initially, he showed  signs and symptoms of aspiration and speech therapy recommended that he  remain n.p.o. with plans to re-evaluate him in a few days.  In regards to  his dysphagia, he did undergo a  modified barium swallow again on November 07, 2004, and showed much improvement.  He was ultimately advanced to a  dysphagia free diet with thin liquids using chin tucks.  Aspiration  precaution and no straws were recommended.  Taking home meds in applesauce  is always recommended and discussed with the patient and his wife.  From a  neurological standpoint, his right facial droop was gradually improving and  bilateral upper and lower muscle strength remained strong and symmetrical.  Neurology continued to follow him on a regular basis.  His heparin drip was  ultimately discontinued and he was restarted on his home regimen of  Aggrenox.   Other issues addressed included his uncontrolled hypertension.  He initially  required Nipride drip as previously mentioned as well as Labetalol.  Cardiology was asked to assist with hypertension management as well as some  transient episodes of ventricular tachycardia and SVT which were both  nonsustained.  Ultimately, he was weaned from drips and started on oral regimen as well as a Catapres patch.  He did intermittently require  Hydralazine and Labetalol IV p.r.n. but was finally better controlled on a  regimen Lisinopril, Catapres patch, Toprol 100 mg b.i.d.  Systolic blood  pressures were ranging around 125 to 140 on this regimen.  He was also  treated for mild hypokalemia which was treated with supplementation.  He  also had mild postoperative anemia which did not require blood transfusion  and remained stable.  On November 10, 2004, Mr. Hashimi had remained off  p.r.n. antihypertensives for over 24 hours.  His blood pressure remained  overall stable and no more arrhythmias were identified.  At this point, he  was transferred to the telemetry unit with plans for him to be discharged  home the following day,  November 11, 2004.  Of note, Mr. Vignola was re-  evaluated by physical therapy and occupational therapy postoperatively on  September 25, no  further occupational needs were identified.  Home Health  speech therapy was recommended at least for a home safety evaluation as well  as a rolling walker though by patient reports, he was able to get one from  his church.  Also of note, he did have an acute gouty exacerbation during  his hospitalization requiring two days treatment with Indomethacin.  Since  his oral medications had been resumed, he had also been on his home regimen  of Allopurinol.   DISPOSITION:  It is anticipated Mr. Ledo will be discharged home on  November 11, 2004.   LABORATORY DATA:  Recent labs show a white blood cell count 4.9, hemoglobin  9.3, hematocrit 27.1, platelet count 242, sodium 136, potassium 3.6,  chloride 105, CO2 25, BUN 10, creatinine 0.9, blood glucose 93, magnesium  2.1, calcium 9.3.   DISCHARGE MEDICATIONS:  1.  Tylox 1-2 tablets p.o. q.4h. p.r.n. pain.  2.  Lisinopril 20 mg p.o. daily.  3.  Catapres TSS 0.3 mg transdermal every week.  4.  Allopurinol 300 mg p.o. daily.  5.  Probenecid 500 mg p.o. b.i.d.  6.  Toprol XL 100 mg p.o. b.i.d.  7.  Lipitor 10 mg p.o. q.p.m.  8.  Aggrenox 1 capsule p.o. daily.   DISCHARGE INSTRUCTIONS:  He is to follow a low fat, low salt diet, to chop  meats and foods as appropriate.  Thin liquids are OK with chin tuck, but  advised to avoid straws and take meds in applesauce.  He may shower and  clean his incisions gently with soap and water.  He should notify the CVTS  office if he develops redness or drainage from the incision site.  He is to  avoid driving or heavy lifting for the next two weeks.  Home Health physical  therapy home safety evaluation has been requested prior to discharge.   FOLLOW UP:  He is to follow up with Dr. Darrick Penna at the CVTS office on November 25, 2004, at 12:30 p.m.  He is to follow up with Dr. Peter Swaziland at  [facility].  He should call to schedule 1-2 weeks follow up.      Jerold Coombe, P.A.     ______________________________  Janetta Hora Fields, MD   AWZ/MEDQ  D:  11/10/2004  T:  11/10/2004  Job:  [id number]   cc:   Peter M. Swaziland, M.D.  Fax: [phone]   Pramod P. Pearlean Brownie, MD  Fax: [phone]   Aida Puffer, M.D.  [address]  [place], [place] [address]

## 2010-07-05 ENCOUNTER — Ambulatory Visit
Admission: RE | Admit: 2010-07-05 | Discharge: 2010-07-05 | Disposition: A | Discharge status: Home / Self Care | Payer: Medicare Other | Source: Ambulatory Visit | Attending: Interventional Radiology | Admitting: Interventional Radiology

## 2010-07-05 ENCOUNTER — Other Ambulatory Visit: Payer: Self-pay

## 2010-07-05 VITALS — BP 138/86 | HR 92 | Temp 97.2°F | Resp 15 | Ht 71.0 in | Wt 165.0 lb

## 2010-07-05 DIAGNOSIS — T82330A Leakage of aortic (bifurcation) graft (replacement), initial encounter: Secondary | ICD-10-CM

## 2010-08-01 ENCOUNTER — Other Ambulatory Visit: Payer: Self-pay | Admitting: Vascular Surgery

## 2010-08-01 DIAGNOSIS — T82330A Leakage of aortic (bifurcation) graft (replacement), initial encounter: Secondary | ICD-10-CM

## 2010-08-03 ENCOUNTER — Encounter (HOSPITAL_COMMUNITY)
Admission: RE | Admit: 2010-08-03 | Discharge: 2010-08-03 | Disposition: A | Discharge status: Home / Self Care | Payer: Medicare Other | Source: Ambulatory Visit | Attending: Interventional Radiology | Admitting: Interventional Radiology

## 2010-08-03 LAB — COMPREHENSIVE METABOLIC PANEL
Alkaline Phosphatase: 88 U/L (ref 39–117)
BUN: 14 mg/dL (ref 6–23)
Chloride: 103 mEq/L (ref 96–112)
Creatinine, Ser: 0.78 mg/dL (ref 0.50–1.35)
GFR calc Af Amer: >60 mL/min (ref >60)
Glucose, Bld: 84 mg/dL (ref 70–99)
Potassium: 4.1 mEq/L (ref 3.5–5.1)
Total Bilirubin: 0.8 mg/dL (ref 0.3–1.2)
Total Protein: 6.3 g/dL (ref 6.0–8.3)

## 2010-08-03 LAB — CBC
Hemoglobin: 14.1 g/dL (ref 13.0–17.0)
MCH: 35 pg — ABNORMAL HIGH (ref 26.0–34.0)
MCHC: 35.8 g/dL (ref 30.0–36.0)
RDW: 13.7 % (ref 11.5–15.5)

## 2010-08-03 LAB — PROTIME-INR: Prothrombin Time: 13.6 seconds (ref 11.6–15.2)

## 2010-08-04 ENCOUNTER — Ambulatory Visit (HOSPITAL_COMMUNITY)
Admission: RE | Admit: 2010-08-04 | Discharge: 2010-08-04 | Disposition: A | Discharge status: Home / Self Care | Payer: Medicare Other | Source: Ambulatory Visit | Attending: Vascular Surgery | Admitting: Vascular Surgery

## 2010-08-04 ENCOUNTER — Ambulatory Visit (HOSPITAL_COMMUNITY)
Admission: RE | Admit: 2010-08-04 | Discharge: 2010-08-04 | Disposition: A | Discharge status: Home / Self Care | Payer: Medicare Other | Source: Ambulatory Visit | Attending: Interventional Radiology | Admitting: Interventional Radiology

## 2010-08-04 ENCOUNTER — Inpatient Hospital Stay (HOSPITAL_COMMUNITY): Admit: 2010-08-04 | Payer: Self-pay | Admitting: Interventional Radiology

## 2010-08-04 DIAGNOSIS — I714 Abdominal aortic aneurysm, without rupture, unspecified: Secondary | ICD-10-CM | POA: Insufficient documentation

## 2010-08-04 DIAGNOSIS — J449 Chronic obstructive pulmonary disease, unspecified: Secondary | ICD-10-CM | POA: Insufficient documentation

## 2010-08-04 DIAGNOSIS — F17200 Nicotine dependence, unspecified, uncomplicated: Secondary | ICD-10-CM | POA: Insufficient documentation

## 2010-08-04 DIAGNOSIS — I10 Essential (primary) hypertension: Secondary | ICD-10-CM | POA: Insufficient documentation

## 2010-08-04 DIAGNOSIS — Z79899 Other long term (current) drug therapy: Secondary | ICD-10-CM | POA: Insufficient documentation

## 2010-08-04 DIAGNOSIS — Y832 Surgical operation with anastomosis, bypass or graft as the cause of abnormal reaction of the patient, or of later complication, without mention of misadventure at the time of the procedure: Secondary | ICD-10-CM | POA: Insufficient documentation

## 2010-08-04 DIAGNOSIS — T82598A Other mechanical complication of other cardiac and vascular devices and implants, initial encounter: Secondary | ICD-10-CM | POA: Insufficient documentation

## 2010-08-04 DIAGNOSIS — I2510 Atherosclerotic heart disease of native coronary artery without angina pectoris: Secondary | ICD-10-CM | POA: Insufficient documentation

## 2010-08-04 DIAGNOSIS — Z7982 Long term (current) use of aspirin: Secondary | ICD-10-CM | POA: Insufficient documentation

## 2010-08-04 DIAGNOSIS — J4489 Other specified chronic obstructive pulmonary disease: Secondary | ICD-10-CM | POA: Insufficient documentation

## 2010-08-04 DIAGNOSIS — Z8673 Personal history of transient ischemic attack (TIA), and cerebral infarction without residual deficits: Secondary | ICD-10-CM | POA: Insufficient documentation

## 2010-08-04 DIAGNOSIS — T82330A Leakage of aortic (bifurcation) graft (replacement), initial encounter: Secondary | ICD-10-CM

## 2010-08-04 DIAGNOSIS — Z01812 Encounter for preprocedural laboratory examination: Secondary | ICD-10-CM | POA: Insufficient documentation

## 2010-08-04 MED ORDER — IOHEXOL 300 MG/ML  SOLN
150.0000 mL | Freq: Once | INTRAMUSCULAR | Status: AC | PRN
  Administered 2010-08-04: 50 mL via INTRAVENOUS

## 2010-08-10 ENCOUNTER — Other Ambulatory Visit: Payer: Self-pay | Admitting: Vascular Surgery

## 2010-08-10 DIAGNOSIS — I714 Abdominal aortic aneurysm, without rupture: Secondary | ICD-10-CM

## 2010-08-19 ENCOUNTER — Encounter: Payer: Self-pay | Admitting: Vascular Surgery

## 2010-08-25 ENCOUNTER — Encounter: Payer: Self-pay | Admitting: Vascular Surgery

## 2010-08-25 ENCOUNTER — Encounter (INDEPENDENT_AMBULATORY_CARE_PROVIDER_SITE_OTHER): Payer: Medicare Other

## 2010-08-25 ENCOUNTER — Ambulatory Visit (INDEPENDENT_AMBULATORY_CARE_PROVIDER_SITE_OTHER): Payer: Medicare Other | Admitting: Vascular Surgery

## 2010-08-25 VITALS — BP 166/87 | HR 69

## 2010-08-25 DIAGNOSIS — I714 Abdominal aortic aneurysm, without rupture: Secondary | ICD-10-CM

## 2010-08-25 NOTE — Progress Notes (Signed)
Patient is an 77 year old male that has previously undergone endovascular aneurysm repair via bilateral groin incisions. He complains today of pain and a mass in his right groin. He denies fever or chills. He states that it has been present for several weeks. He has some pain in the right groin which is exacerbated with exercise. He recently underwent translumbar coil embolization of a type II endoleak by Dr. Fredia Sorrow. He has previously had a carotid endarterectomy.  Physical exam:  Vascular: He has 2+ femoral pulses bilaterally  Abdomen: He has some vaguely palpable lymph nodes in the right groin, it is nontender, he has no visible or palpable bulge on Valsalva. There is no erythema of the skin.  He had a duplex ultrasound of the groin today which I reviewed and interpreted which shows no evidence of pseudoaneurysm and patent femoral vessels  Assessment: Right groin pain small inguinal hernia   Plan: Patient will return if the pain persists or becomes worse over time or the mass enlarges otherwise he will have a CT angiogram of the abdomen and pelvis to evaluate his aneurysm stent graft in December of 2012. Plan:

## 2010-08-25 NOTE — Progress Notes (Signed)
F/u after lab today, pt says nodule is still the same.

## 2010-08-31 ENCOUNTER — Ambulatory Visit
Admit: 2010-08-31 | Discharge: 2010-08-31 | Disposition: A | Discharge status: Home / Self Care | Payer: Medicare Other | Attending: Interventional Radiology | Admitting: Interventional Radiology

## 2010-08-31 VITALS — BP 120/70 | HR 71 | Temp 97.5°F | Resp 12

## 2010-08-31 DIAGNOSIS — T82330A Leakage of aortic (bifurcation) graft (replacement), initial encounter: Secondary | ICD-10-CM

## 2010-09-01 NOTE — Progress Notes (Signed)
 F/U POST ENDOLEAK REPAIR.  DOING WELL POST PROCEDURE.  PT WILL F/U W/ DR FIELDS AND HAVE A CT-A 12-29-10.  WE WILL F/U W/ PT AFTER 12-29-10 CT.

## 2010-09-06 NOTE — Procedures (Unsigned)
VASCULAR LAB EXAM  INDICATION:  Swelling, right groin, for 3 weeks/palpable mass.  HISTORY: AAA stent graft in 2009. Diabetes:  No Cardiac:  Yes. Hypertension:  Yes.  EXAM: 1. Right groin duplex reveals no evidence of hematoma, pseudoaneurysm,     AV fistula, or DVT. 2. EIA, CFA, SFA on the right are all patent with biphasic wave forms. 3. Common femoral vein is compressible  IMPRESSION:  Normal duplex of right groin.  ___________________________________________ Janetta Hora. Fields, MD  LT/MEDQ  D:  08/25/2010  T:  08/25/2010  Job:  [id number]

## 2010-09-22 ENCOUNTER — Ambulatory Visit (INDEPENDENT_AMBULATORY_CARE_PROVIDER_SITE_OTHER): Payer: Medicare Other | Admitting: Thoracic Diseases

## 2010-09-22 DIAGNOSIS — Z8679 Personal history of other diseases of the circulatory system: Secondary | ICD-10-CM

## 2010-09-22 DIAGNOSIS — Z9889 Other specified postprocedural states: Secondary | ICD-10-CM

## 2010-09-22 DIAGNOSIS — K4090 Unilateral inguinal hernia, without obstruction or gangrene, not specified as recurrent: Secondary | ICD-10-CM

## 2010-09-22 NOTE — Progress Notes (Signed)
[name] is a 77 y.o. male who had an endovascular repair of AAA via bilateral groin incisions on 10/29/07 by Dr. Arbie Cookey. He had a know type 2 endoleak for which he underwent a transluminal coil embolization by Dr. Fredia Sorrow.  He was seen by Dr. Darrick Penna on 08/25/10/ for right groin swelling. He states that he first noted swelling occur after trying to wrestle his lawn mower out of a ditch. He states he had planted the right leg and tried to pull the mower back onto level ground. He states the mildly painful swelling began after this and is painful with "certain activities. He states he thinks the swelling is getting bigger and comes in for evaluation. He denies fever chills, redness in the area.   Past Medical History  Diagnosis Date  . Atrial fibrillation   . Hypertension   . AAA (abdominal aortic aneurysm)   . History of carotid stenosis   . Stroke   . Gout   . Arthritis   . Hyperlipidemia     Current outpatient prescriptions:Ascorbic Acid (VITAMIN C CR PO), Take by mouth daily.  , Disp: , Rfl: ;  aspirin 81 MG tablet, Take 81 mg by mouth daily.  , Disp: , Rfl: ;  carvedilol (COREG) 6.25 MG tablet, Take 6.25 mg by mouth daily.  , Disp: , Rfl: ;  co-enzyme Q-10 30 MG capsule, Take 30 mg by mouth 3 (three) times daily.  , Disp: , Rfl: ;  colchicine 0.6 MG tablet, Take 0.6 mg by mouth as needed.  , Disp: , Rfl:  Multiple Vitamin (MULTIVITAMIN) tablet, Take 1 tablet by mouth daily.  , Disp: , Rfl: ;  VITAMIN E PO, Take by mouth daily.  , Disp: , Rfl:   No Known Allergies  History  Substance Use Topics  . Smoking status: Current Everyday Smoker    Types: Cigars  . Smokeless tobacco: Never Used  . Alcohol Use: 1.8 oz/week    3 Cans of beer per week    PE: WDWN male in NAD  Right groin with bulge in the inguinal area when the patient stands which reduces easily. It is minimally tender. There is no erythema or drainage in the area. This swelling disappears with the patient in the supine  position  Assessment and Plan: Probable right inguinal hernia We have referred the pt to Gen Surgery for evaluation He will F/U with CTA abdomen/pelvis per [facility] protocol  Clinic MD: Kelvin Cellar, MD

## 2010-09-29 ENCOUNTER — Telehealth (INDEPENDENT_AMBULATORY_CARE_PROVIDER_SITE_OTHER): Payer: Self-pay | Admitting: General Surgery

## 2010-09-29 NOTE — Telephone Encounter (Signed)
Patient called re: moving appt sooner with Dr Dwain Sarna. Patient is scheduled for [facility] evaluation on 10/10/10. States area is getting more painful. It is reducible and he has had no nausea or vomiting. I did not see anywhere to move him sooner. He has had surgery by Dr Dwain Sarna before and did not want to switch MD's. If there is a possibility to move sooner, can we? Thanks.

## 2010-10-03 ENCOUNTER — Encounter (INDEPENDENT_AMBULATORY_CARE_PROVIDER_SITE_OTHER): Payer: Self-pay | Admitting: General Surgery

## 2010-10-03 ENCOUNTER — Ambulatory Visit (INDEPENDENT_AMBULATORY_CARE_PROVIDER_SITE_OTHER): Payer: Medicare Other | Admitting: General Surgery

## 2010-10-03 VITALS — BP 142/98 | HR 64 | Temp 97.8°F | Ht 71.0 in | Wt 167.0 lb

## 2010-10-03 DIAGNOSIS — K4090 Unilateral inguinal hernia, without obstruction or gangrene, not specified as recurrent: Secondary | ICD-10-CM | POA: Insufficient documentation

## 2010-10-03 NOTE — Progress Notes (Signed)
Chief Complaint  Patient presents with  . Other    new pt- eval of right hernia     HPI [name] Sr. is a 77 y.o. male.  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 He has noted over the past 5-6 weeks that he has a right groin bulge has become increasingly tender. He has no nausea, vomiting and he is having normal bowel movements. He's again he only relief when he is able to reduce her when he lies down. He reports that this is aggravated by really doing any activity at this point. He comes in today after being referred back by Dr. Darrick Penna for evaluation for a new right groin hernia. He otherwise is doing very well with no new medical issues since I last saw him.  Past Medical History  Diagnosis Date  . Atrial fibrillation   . Hypertension   . AAA (abdominal aortic aneurysm)   . History of carotid stenosis   . Gout   . Arthritis   . Hyperlipidemia   . Stroke 2006    three strokes   . Wears glasses     Past Surgical History  Procedure Date  . Coronary artery bypass graft 2000    5 vessel  . Tonsillectomy   . Joint replacement     Right TKR  . Abdominal aortic aneurysm repair 2008    Gore Excluder Stent Graft repair  . Carotid endarterectomy 2006    Left Side  . Hemorrhoid surgery   . Heart surgery  1970    bypass and open heart surgery  . Replacement total knee 2008  . Abdominal aortic aneurysm repair 2010/2012  . Colon surgery     lap right colon    Family History  Problem Relation Age of Onset  . Other Mother     falopian tube during pregnancy   . Heart disease Father   . Cancer Brother     liver     Social History History  Substance Use Topics  . Smoking status: Current Everyday Smoker    Types: Cigars  . Smokeless tobacco: Never Used  . Alcohol Use: 1.8 oz/week    3 Cans of beer per week    No Known Allergies  Current Outpatient Prescriptions  Medication Sig  Dispense Refill  . aspirin 81 MG tablet Take 81 mg by mouth daily.        . carvedilol (COREG) 6.25 MG tablet Take 6.25 mg by mouth daily.        . Multiple Vitamin (MULTIVITAMIN) tablet Take 1 tablet by mouth daily.        . Ascorbic Acid (VITAMIN C CR PO) Take by mouth daily.        [name] co-enzyme Q-10 30 MG capsule Take 30 mg by mouth 3 (three) times daily.        . colchicine 0.6 MG tablet Take 0.6 mg by mouth as needed.        [name] VITAMIN E PO Take by mouth daily.          Review of Systems Review of Systems  Constitutional: Negative.   HENT: Negative.   Eyes: Negative.   Respiratory: Negative.   Cardiovascular: Negative.   Gastrointestinal: Negative.   Genitourinary: Negative.   Musculoskeletal: Negative.   Skin: Negative.   Neurological: Negative.   Endo/Heme/Allergies: Negative.   Psychiatric/Behavioral: Negative.     Blood pressure  142/98, pulse 64, temperature 97.8 F (36.6 C), height 5\' 11"  (1.803 m), weight 167 lb (75.751 kg).  Physical Exam Physical Exam  Constitutional: He appears well-developed and well-nourished.  Eyes: No scleral icterus.  Neck: Neck supple.  Cardiovascular: Normal rate, regular rhythm and normal heart sounds.   Respiratory: Effort normal and breath sounds normal. He has no wheezes. He has no rales.  GI: Soft. He exhibits no mass. There is no tenderness. A hernia is present. Hernia confirmed positive in the right inguinal area (tender reducible). Hernia confirmed negative in the left inguinal area.  Lymphadenopathy:    He has no cervical adenopathy.       Right: No inguinal adenopathy present.       Left: No inguinal adenopathy present.      Assessment    RIH    Plan    He has a symptomatic right inguinal hernia.. I don't think watchful waiting is in a be reasonable for him. We discussed open right inguinal hernia with mesh. The risks being but not limited to bleeding, infection, recurrence as well as groin pain. I am going to leave him  on his aspirin during the surgery due to my concern for his other comorbidities. I will plan on scheduling him as soon as possible due to symptoms.       Barney Gertsch 10/03/2010, 4:21 PM

## 2010-10-07 ENCOUNTER — Telehealth: Payer: Self-pay | Admitting: Cardiology

## 2010-10-07 ENCOUNTER — Encounter (HOSPITAL_COMMUNITY)
Admission: RE | Admit: 2010-10-07 | Discharge: 2010-10-07 | Disposition: A | Discharge status: Home / Self Care | Payer: Medicare Other | Source: Ambulatory Visit | Attending: General Surgery | Admitting: General Surgery

## 2010-10-07 ENCOUNTER — Other Ambulatory Visit (INDEPENDENT_AMBULATORY_CARE_PROVIDER_SITE_OTHER): Payer: Self-pay | Admitting: General Surgery

## 2010-10-07 ENCOUNTER — Ambulatory Visit (HOSPITAL_COMMUNITY)
Admission: RE | Admit: 2010-10-07 | Discharge: 2010-10-07 | Disposition: A | Discharge status: Home / Self Care | Payer: Medicare Other | Source: Ambulatory Visit | Attending: General Surgery | Admitting: General Surgery

## 2010-10-07 DIAGNOSIS — J438 Other emphysema: Secondary | ICD-10-CM | POA: Insufficient documentation

## 2010-10-07 DIAGNOSIS — Z01810 Encounter for preprocedural cardiovascular examination: Secondary | ICD-10-CM | POA: Insufficient documentation

## 2010-10-07 DIAGNOSIS — K4090 Unilateral inguinal hernia, without obstruction or gangrene, not specified as recurrent: Secondary | ICD-10-CM | POA: Insufficient documentation

## 2010-10-07 DIAGNOSIS — Z01812 Encounter for preprocedural laboratory examination: Secondary | ICD-10-CM | POA: Insufficient documentation

## 2010-10-07 DIAGNOSIS — Z01811 Encounter for preprocedural respiratory examination: Secondary | ICD-10-CM | POA: Insufficient documentation

## 2010-10-07 LAB — COMPREHENSIVE METABOLIC PANEL
ALT: 17 U/L (ref 0–53)
AST: 21 U/L (ref 0–37)
Albumin: 4.1 g/dL (ref 3.5–5.2)
Alkaline Phosphatase: 85 U/L (ref 39–117)
BUN: 13 mg/dL (ref 6–23)
Chloride: 103 mEq/L (ref 96–112)
Potassium: 4.6 mEq/L (ref 3.5–5.1)
Sodium: 140 mEq/L (ref 135–145)
Total Protein: 6.6 g/dL (ref 6.0–8.3)

## 2010-10-07 LAB — DIFFERENTIAL
Basophils Absolute: 0 10*3/uL (ref 0.0–0.1)
Basophils Relative: 1 % (ref 0–1)
Eosinophils Absolute: 0.1 10*3/uL (ref 0.0–0.7)
Eosinophils Relative: 3 % (ref 0–5)
Monocytes Absolute: 0.5 10*3/uL (ref 0.1–1.0)

## 2010-10-07 LAB — CBC
MCHC: 36.3 g/dL — ABNORMAL HIGH (ref 30.0–36.0)
Platelets: 148 10*3/uL — ABNORMAL LOW (ref 150–400)
RDW: 13.7 % (ref 11.5–15.5)
WBC: 4.4 10*3/uL (ref 4.0–10.5)

## 2010-10-07 LAB — SURGICAL PCR SCREEN: MRSA, PCR: NEGATIVE

## 2010-10-07 NOTE — Telephone Encounter (Signed)
Faxed over last OV Note, last EKG available, ECHO, and Stress, today to [facility] at [phone]

## 2010-10-07 NOTE — Telephone Encounter (Signed)
Pt surgery is on 8/30.  Please fax over last OV, EKG, Echo, Stress if at all available.

## 2010-10-10 ENCOUNTER — Ambulatory Visit (INDEPENDENT_AMBULATORY_CARE_PROVIDER_SITE_OTHER): Payer: Self-pay | Admitting: General Surgery

## 2010-10-13 ENCOUNTER — Ambulatory Visit: Payer: Medicare Other | Admitting: Vascular Surgery

## 2010-10-13 ENCOUNTER — Ambulatory Visit (HOSPITAL_COMMUNITY)
Admission: RE | Admit: 2010-10-13 | Discharge: 2010-10-13 | Disposition: A | Discharge status: Home / Self Care | Payer: Medicare Other | Source: Ambulatory Visit | Attending: General Surgery | Admitting: General Surgery

## 2010-10-13 ENCOUNTER — Other Ambulatory Visit: Payer: Medicare Other

## 2010-10-13 DIAGNOSIS — I714 Abdominal aortic aneurysm, without rupture, unspecified: Secondary | ICD-10-CM | POA: Insufficient documentation

## 2010-10-13 DIAGNOSIS — Z01810 Encounter for preprocedural cardiovascular examination: Secondary | ICD-10-CM | POA: Insufficient documentation

## 2010-10-13 DIAGNOSIS — K4090 Unilateral inguinal hernia, without obstruction or gangrene, not specified as recurrent: Secondary | ICD-10-CM | POA: Insufficient documentation

## 2010-10-13 DIAGNOSIS — Z01812 Encounter for preprocedural laboratory examination: Secondary | ICD-10-CM | POA: Insufficient documentation

## 2010-10-13 DIAGNOSIS — I10 Essential (primary) hypertension: Secondary | ICD-10-CM | POA: Insufficient documentation

## 2010-10-13 DIAGNOSIS — Z01818 Encounter for other preprocedural examination: Secondary | ICD-10-CM | POA: Insufficient documentation

## 2010-10-13 DIAGNOSIS — Z8673 Personal history of transient ischemic attack (TIA), and cerebral infarction without residual deficits: Secondary | ICD-10-CM | POA: Insufficient documentation

## 2010-10-13 DIAGNOSIS — I359 Nonrheumatic aortic valve disorder, unspecified: Secondary | ICD-10-CM | POA: Insufficient documentation

## 2010-10-13 HISTORY — PX: HERNIA REPAIR: SHX51

## 2010-10-13 NOTE — Op Note (Signed)
NAME:  [name], [name] NO.:  [id number]  MEDICAL RECORD NO.:  [id number]  LOCATION:  [facility]                         FACILITY:  [facility]  PHYSICIAN:  Juanetta Gosling, MDDATE OF BIRTH:  Sep 07, 1926  DATE OF PROCEDURE: DATE OF DISCHARGE:                              OPERATIVE REPORT   PREOPERATIVE DIAGNOSIS:  Symptomatic right inguinal hernia  POSTOPERATIVE DIAGNOSIS:  Indirect right inguinal hernia.  PROCEDURE:  Right inguinal hernia repair with Ultrapro mesh patch.  SURGEON:  Juanetta Gosling, MD  ASSISTANT:  None.  ANESTHESIA:  General.  SUPERVISING ANESTHESIOLOGIST:  Guadalupe Maple, MD  SPECIMENS:  None.  DRAINS:  None.  COMPLICATIONS:  None.  ESTIMATED BLOOD LOSS:  Minimal.  DISPOSITION:  To recovery room in stable condition.  INDICATIONS:  This is an 77 year old male who I know well from a laparoscopic right colectomy for a tubulovillous adenoma in 2011.  He was been seen by Dr. Darrick Penna for evaluation for his peripheral vascular disease and was noted to have a new right groin hernia.  I then saw him. This area was very symptomatic and we discussed repair.  PROCEDURE:  After informed consent was obtained, the patient was taken to the operating room.  He was administered 1 g of intravenous cefazolin.  Sequential compression devices were placed on lower extremities prior to induction with anesthesia.  He was then placed under general anesthesia without complication with an LMA.  His groin was then prepped and draped in a standard sterile surgical fashion. Surgical time-out was then performed.  I infiltrated 0.25% Marcaine.  I did a field block in his right groin. I then made an incision in his right groin and carried this down to the superficial epigastric vein.  I ligated this and divided them.  I then found his external oblique.  I entered this sharply through his external ring.  I then encircled the spermatic cord.  He was noted to have a  weak floor as well as an indirect hernia.  His indirect hernia was a fairly large lipoma.  I excised a portion of this and then reduced the remainder into his abdomen.  There was no other indirect hernia sac.  I then tightened his internal ring with a 2-0 Vicryl and placed an Ultrapro mesh patch over the entire area.  I made a T cut and wrapped this around the spermatic cord.  I secured this with 2-0 Prolene to the pubic tubercle internal oblique superiorly and the inguinal ligament in numerous positions inferiorly.  This completely covered the floor as well as the internal ring and I laid the lateral portion flat under the external oblique.  Hemostasis was observed.  I then closed this with 2-0 Vicryl, 3-0 Vicryl for Scarpa's and 4-0 Monocryl for the skin.  I infiltrated more 0.25% Marcaine throughout his groin as well as performing an ilioinguinal nerve block.  Dermabond was placed over the wound.  He tolerated this well, was extubated in the operating room and transferred to recovery room in stable condition.     Juanetta Gosling, MD     MCW/MEDQ  D:  10/13/2010  T:  10/13/2010  Job:  [id number]  cc:  Janetta Hora. Darrick Penna, MD  Electronically Signed by Emelia Loron MD on 10/13/2010 02:44:53 PM

## 2010-10-16 ENCOUNTER — Emergency Department (HOSPITAL_COMMUNITY): Payer: Medicare Other

## 2010-10-16 ENCOUNTER — Inpatient Hospital Stay (HOSPITAL_COMMUNITY)
Admission: EM | Admit: 2010-10-16 | Discharge: 2010-10-30 | DRG: 216 | Disposition: A | Discharge status: Home Health | Payer: Medicare Other | Source: Ambulatory Visit | Attending: Surgery | Admitting: Surgery

## 2010-10-16 DIAGNOSIS — J189 Pneumonia, unspecified organism: Secondary | ICD-10-CM | POA: Diagnosis present

## 2010-10-16 DIAGNOSIS — J449 Chronic obstructive pulmonary disease, unspecified: Secondary | ICD-10-CM | POA: Diagnosis present

## 2010-10-16 DIAGNOSIS — I2789 Other specified pulmonary heart diseases: Secondary | ICD-10-CM | POA: Diagnosis present

## 2010-10-16 DIAGNOSIS — D62 Acute posthemorrhagic anemia: Secondary | ICD-10-CM | POA: Diagnosis not present

## 2010-10-16 DIAGNOSIS — Z951 Presence of aortocoronary bypass graft: Secondary | ICD-10-CM

## 2010-10-16 DIAGNOSIS — I498 Other specified cardiac arrhythmias: Secondary | ICD-10-CM | POA: Diagnosis present

## 2010-10-16 DIAGNOSIS — I2510 Atherosclerotic heart disease of native coronary artery without angina pectoris: Secondary | ICD-10-CM | POA: Diagnosis present

## 2010-10-16 DIAGNOSIS — Z7982 Long term (current) use of aspirin: Secondary | ICD-10-CM

## 2010-10-16 DIAGNOSIS — R0602 Shortness of breath: Secondary | ICD-10-CM

## 2010-10-16 DIAGNOSIS — I4891 Unspecified atrial fibrillation: Secondary | ICD-10-CM | POA: Diagnosis present

## 2010-10-16 DIAGNOSIS — I5033 Acute on chronic diastolic (congestive) heart failure: Secondary | ICD-10-CM | POA: Diagnosis present

## 2010-10-16 DIAGNOSIS — F17200 Nicotine dependence, unspecified, uncomplicated: Secondary | ICD-10-CM | POA: Diagnosis present

## 2010-10-16 DIAGNOSIS — J90 Pleural effusion, not elsewhere classified: Secondary | ICD-10-CM | POA: Diagnosis present

## 2010-10-16 DIAGNOSIS — Z8673 Personal history of transient ischemic attack (TIA), and cerebral infarction without residual deficits: Secondary | ICD-10-CM

## 2010-10-16 DIAGNOSIS — Z8249 Family history of ischemic heart disease and other diseases of the circulatory system: Secondary | ICD-10-CM

## 2010-10-16 DIAGNOSIS — J4489 Other specified chronic obstructive pulmonary disease: Secondary | ICD-10-CM | POA: Diagnosis present

## 2010-10-16 DIAGNOSIS — I509 Heart failure, unspecified: Secondary | ICD-10-CM | POA: Diagnosis present

## 2010-10-16 DIAGNOSIS — D696 Thrombocytopenia, unspecified: Secondary | ICD-10-CM | POA: Diagnosis present

## 2010-10-16 DIAGNOSIS — I359 Nonrheumatic aortic valve disorder, unspecified: Principal | ICD-10-CM | POA: Diagnosis present

## 2010-10-16 LAB — COMPREHENSIVE METABOLIC PANEL
Alkaline Phosphatase: 69 U/L (ref 39–117)
BUN: 15 mg/dL (ref 6–23)
Chloride: 98 mEq/L (ref 96–112)
GFR calc Af Amer: >60 mL/min (ref >60)
Glucose, Bld: 106 mg/dL — ABNORMAL HIGH (ref 70–99)
Potassium: 4.1 mEq/L (ref 3.5–5.1)
Total Bilirubin: 1 mg/dL (ref 0.3–1.2)

## 2010-10-16 LAB — DIFFERENTIAL
Basophils Absolute: 0 10*3/uL (ref 0.0–0.1)
Lymphocytes Relative: 14 % (ref 12–46)
Lymphs Abs: 1 10*3/uL (ref 0.7–4.0)
Monocytes Absolute: 0.8 10*3/uL (ref 0.1–1.0)
Neutro Abs: 5.1 10*3/uL (ref 1.7–7.7)

## 2010-10-16 LAB — CBC
HCT: 36.3 % — ABNORMAL LOW (ref 39.0–52.0)
Hemoglobin: 12.8 g/dL — ABNORMAL LOW (ref 13.0–17.0)
MCHC: 35.3 g/dL (ref 30.0–36.0)
MCV: 98.1 fL (ref 78.0–100.0)

## 2010-10-16 LAB — CK TOTAL AND CKMB (NOT AT ARMC): Total CK: 104 U/L (ref 7–232)

## 2010-10-16 MED ORDER — IOHEXOL 300 MG/ML  SOLN
100.0000 mL | Freq: Once | INTRAMUSCULAR | Status: AC | PRN
  Administered 2010-10-16: 100 mL via INTRAVENOUS

## 2010-10-17 DIAGNOSIS — I4891 Unspecified atrial fibrillation: Secondary | ICD-10-CM

## 2010-10-17 DIAGNOSIS — I5033 Acute on chronic diastolic (congestive) heart failure: Secondary | ICD-10-CM

## 2010-10-17 LAB — CARDIAC PANEL(CRET KIN+CKTOT+MB+TROPI)
CK, MB: 3.8 ng/mL (ref 0.3–4.0)
CK, MB: 3.6 ng/mL (ref 0.3–4.0)
Relative Index: INVALID (ref 0.0–2.5)
Troponin I: <0.3 ng/mL (ref <0.30)
Troponin I: <0.3 ng/mL (ref <0.30)

## 2010-10-17 LAB — BASIC METABOLIC PANEL
BUN: 16 mg/dL (ref 6–23)
CO2: 26 mEq/L (ref 19–32)
Chloride: 99 mEq/L (ref 96–112)
Glucose, Bld: 91 mg/dL (ref 70–99)
Potassium: 3.4 mEq/L — ABNORMAL LOW (ref 3.5–5.1)
Sodium: 137 mEq/L (ref 135–145)

## 2010-10-17 LAB — DIFFERENTIAL
Basophils Absolute: 0 10*3/uL (ref 0.0–0.1)
Basophils Relative: 0 % (ref 0–1)
Lymphocytes Relative: 12 % (ref 12–46)
Neutro Abs: 4.6 10*3/uL (ref 1.7–7.7)
Neutrophils Relative %: 73 % (ref 43–77)

## 2010-10-17 LAB — HEPARIN LEVEL (UNFRACTIONATED)
Heparin Unfractionated: 0.17 IU/mL — ABNORMAL LOW (ref 0.30–0.70)
Heparin Unfractionated: 0.27 IU/mL — ABNORMAL LOW (ref 0.30–0.70)

## 2010-10-17 LAB — CBC
HCT: 33 % — ABNORMAL LOW (ref 39.0–52.0)
Hemoglobin: 11.5 g/dL — ABNORMAL LOW (ref 13.0–17.0)
RBC: 3.36 MIL/uL — ABNORMAL LOW (ref 4.22–5.81)

## 2010-10-18 ENCOUNTER — Inpatient Hospital Stay (HOSPITAL_COMMUNITY): Payer: Medicare Other

## 2010-10-18 DIAGNOSIS — I359 Nonrheumatic aortic valve disorder, unspecified: Secondary | ICD-10-CM

## 2010-10-18 LAB — BASIC METABOLIC PANEL
Calcium: 9.4 mg/dL (ref 8.4–10.5)
GFR calc Af Amer: >60 mL/min (ref >60)
GFR calc non Af Amer: >60 mL/min (ref >60)
Glucose, Bld: 103 mg/dL — ABNORMAL HIGH (ref 70–99)
Sodium: 137 mEq/L (ref 135–145)

## 2010-10-18 LAB — CBC
HCT: 34 % — ABNORMAL LOW (ref 39.0–52.0)
MCHC: 34.4 g/dL (ref 30.0–36.0)
Platelets: 145 10*3/uL — ABNORMAL LOW (ref 150–400)
RDW: 14.1 % (ref 11.5–15.5)

## 2010-10-19 ENCOUNTER — Inpatient Hospital Stay (HOSPITAL_COMMUNITY): Payer: Medicare Other

## 2010-10-19 LAB — CBC
MCH: 34.7 pg — ABNORMAL HIGH (ref 26.0–34.0)
MCV: 98.5 fL (ref 78.0–100.0)
Platelets: 140 10*3/uL — ABNORMAL LOW (ref 150–400)
RDW: 14 % (ref 11.5–15.5)
WBC: 5.7 10*3/uL (ref 4.0–10.5)

## 2010-10-19 LAB — BASIC METABOLIC PANEL
CO2: 25 mEq/L (ref 19–32)
Calcium: 8.7 mg/dL (ref 8.4–10.5)
Creatinine, Ser: 0.82 mg/dL (ref 0.50–1.35)
Glucose, Bld: 150 mg/dL — ABNORMAL HIGH (ref 70–99)

## 2010-10-19 LAB — PROTIME-INR: INR: 1.04 (ref 0.00–1.49)

## 2010-10-20 ENCOUNTER — Telehealth (INDEPENDENT_AMBULATORY_CARE_PROVIDER_SITE_OTHER): Payer: Self-pay

## 2010-10-20 DIAGNOSIS — I2510 Atherosclerotic heart disease of native coronary artery without angina pectoris: Secondary | ICD-10-CM

## 2010-10-20 DIAGNOSIS — R0989 Other specified symptoms and signs involving the circulatory and respiratory systems: Secondary | ICD-10-CM

## 2010-10-20 DIAGNOSIS — I359 Nonrheumatic aortic valve disorder, unspecified: Secondary | ICD-10-CM

## 2010-10-20 LAB — BASIC METABOLIC PANEL
BUN: 22 mg/dL (ref 6–23)
CO2: 29 mEq/L (ref 19–32)
Chloride: 102 mEq/L (ref 96–112)
Creatinine, Ser: 0.85 mg/dL (ref 0.50–1.35)

## 2010-10-20 LAB — POCT I-STAT 3, ART BLOOD GAS (G3+)
Acid-base deficit: 1 mmol/L (ref 0.0–2.0)
O2 Saturation: 97 %
TCO2: 23 mmol/L (ref 0–100)
pO2, Arterial: 83 mmHg (ref 80.0–100.0)

## 2010-10-20 LAB — POCT I-STAT 3, VENOUS BLOOD GAS (G3P V)
Acid-base deficit: 2 mmol/L (ref 0.0–2.0)
Bicarbonate: 23.3 mEq/L (ref 20.0–24.0)
TCO2: 25 mmol/L (ref 0–100)
pO2, Ven: 35 mmHg (ref 30.0–45.0)

## 2010-10-20 LAB — POCT ACTIVATED CLOTTING TIME
Activated Clotting Time: 138 seconds
Activated Clotting Time: 353 seconds

## 2010-10-20 LAB — CBC
HCT: 32.8 % — ABNORMAL LOW (ref 39.0–52.0)
MCHC: 35.4 g/dL (ref 30.0–36.0)
MCV: 99.1 fL (ref 78.0–100.0)
RDW: 14.2 % (ref 11.5–15.5)

## 2010-10-20 NOTE — Cardiovascular Report (Signed)
NAME:  [name], [name] NO.:  [id number]  MEDICAL RECORD NO.:  [id number]  LOCATION:  3707                         FACILITY:  [facility]  PHYSICIAN:  Peter M. Sw[place]iland, M.D.  DATE OF BIRTH:  1926/09/01  DATE OF PROCEDURE:  10/20/2010 DATE OF DISCHARGE:                           CARDIAC CATHETERIZATION   INDICATIONS FOR PROCEDURE:  The patient is an 77 year old white male with a history of coronary artery disease status post CABG approximately 10 years ago.  He has a history of peripheral vascular disease and aortic stenosis.  He presents with new onset of congestive heart failure.  Echocardiogram is consistent with critical aortic stenosis. Ejection fraction has dropped to 25% which is new compared to 1 year ago.  Cardiac catheterization was recommended.  PROCEDURES:  Right and left heart catheterization, coronary and left ventricular angiography.  ACCESS:  Via the left femoral artery and vein using standard Seldinger technique.  EQUIPMENTS:  5-French 4-cm right and left Judkins catheter, 5-French pigtail catheter, 5-French arterial sheath, 7-French venous sheath, 7- French balloon-tip Swan-Ganz catheter.  MEDICATIONS:  Local anesthesia, 1% Xylocaine, Versed 1 mg IV.  CONTRAST:  110 mL of Omnipaque.  HEMODYNAMIC DATA:  Right atrial pressure is 19 mmHg.  Right ventricular pressure is 42/7 mmHg.  Pulmonary artery pressure is 46/26 with a mean of 35 mmHg.  Pulmonary capillary wedge pressure is 24 with a mean of 23 mmHg.  Aortic pressure is 102/58 with a mean of 76 mmHg.  Left ventricle pressure is 142 with EDP of 29 mmHg.  Aortic valve mean gradient is 33 mmHg with aortic valve area of 0.9 sq cm.  Valve index is 0.48.  Cardiac output by Fick is 5.1 liters per minute with an index of 2.56.  By thermodilution, cardiac outputs 3.56 liters per minute with an index of 1.77.  ANGIOGRAPHIC DATA:  Left ventricular angiography was performed in the RAO view.  This  demonstrates severe global hypokinesis with ejection fraction of 25%.  The left coronary artery arises and distributes normally.  The left main coronary artery has a 90% ostial stenosis.  The left anterior descending artery is occluded in the midvessel.  The left circumflex coronary artery is occluded proximally.  The right coronary artery has an 80% ostial stenosis.  It then occluded in the midvessel after the right ventricular marginal branch.  The right ventricular marginal branch has an 80-90% stenosis proximally.  The saphenous vein graft to the PDA is patent.  Saphenous vein graft sequentially to the first diagonal and first obtuse marginal vessel is patent.  Saphenous vein graft to the second obtuse marginal vessel fills the distal circumflex and this graft is also patent.  The LIMA graft to the LAD is widely patent.  FINAL IMPRESSION: 1. Severe aortic stenosis. 2. Severe left ventricular dysfunction. 3. Severe three-vessel obstructive atherosclerotic coronary artery     disease including left main disease. 4. All grafts were patent including left internal mammary artery graft     to the left anterior descending, saphenous vein graft to the     posterior descending artery, saphenous vein graft sequentially to     the first diagonal and first obtuse marginal vessel, and  saphenous     vein graft to the second obtuse marginal vessel. 5. Mild to moderate pulmonary hypertension.  PLAN:  We would recommend consideration for aortic valve replacement given the fact that he has new onset of congestive heart failure with critical aortic stenosis and worsening LV function.          ______________________________ Peter M. [place], M.D.     PMJ/MEDQ  D:  10/20/2010  T:  10/20/2010  Job:  [id number]  cc:   Evelene Croon, M.D.  Electronically Signed by PETER Sw[place]iland M.D. on 10/20/2010 06:48:09 PM

## 2010-10-20 NOTE — Telephone Encounter (Signed)
LMOM for pt to call our office to schedule follow up visit with DR Rolan Bucco

## 2010-10-21 ENCOUNTER — Telehealth (INDEPENDENT_AMBULATORY_CARE_PROVIDER_SITE_OTHER): Payer: Self-pay | Admitting: General Surgery

## 2010-10-21 ENCOUNTER — Inpatient Hospital Stay (HOSPITAL_COMMUNITY): Payer: Medicare Other

## 2010-10-21 LAB — BASIC METABOLIC PANEL
CO2: 27 mEq/L (ref 19–32)
Calcium: 8.8 mg/dL (ref 8.4–10.5)
Creatinine, Ser: 0.9 mg/dL (ref 0.50–1.35)
GFR calc non Af Amer: >60 mL/min (ref >60)
Glucose, Bld: 96 mg/dL (ref 70–99)
Sodium: 137 mEq/L (ref 135–145)

## 2010-10-21 LAB — CBC
HCT: 31.7 % — ABNORMAL LOW (ref 39.0–52.0)
MCH: 34.6 pg — ABNORMAL HIGH (ref 26.0–34.0)
MCHC: 35.3 g/dL (ref 30.0–36.0)
RDW: 14 % (ref 11.5–15.5)

## 2010-10-22 LAB — CBC
MCH: 33.7 pg (ref 26.0–34.0)
MCHC: 34 g/dL (ref 30.0–36.0)
MCV: 99.4 fL (ref 78.0–100.0)
Platelets: 146 10*3/uL — ABNORMAL LOW (ref 150–400)
RBC: 3.23 MIL/uL — ABNORMAL LOW (ref 4.22–5.81)
RDW: 14.1 % (ref 11.5–15.5)

## 2010-10-22 LAB — HEPARIN LEVEL (UNFRACTIONATED): Heparin Unfractionated: 0.46 IU/mL (ref 0.30–0.70)

## 2010-10-23 LAB — CBC
MCH: 33.6 pg (ref 26.0–34.0)
MCHC: 34.3 g/dL (ref 30.0–36.0)
MCV: 98.1 fL (ref 78.0–100.0)
Platelets: 144 10*3/uL — ABNORMAL LOW (ref 150–400)
RBC: 3.21 MIL/uL — ABNORMAL LOW (ref 4.22–5.81)

## 2010-10-23 LAB — URINALYSIS, ROUTINE W REFLEX MICROSCOPIC
Bilirubin Urine: NEGATIVE
Hgb urine dipstick: NEGATIVE
Ketones, ur: NEGATIVE mg/dL
Specific Gravity, Urine: 1.022 (ref 1.005–1.030)
pH: 6 (ref 5.0–8.0)

## 2010-10-23 LAB — BASIC METABOLIC PANEL
BUN: 24 mg/dL — ABNORMAL HIGH (ref 6–23)
Chloride: 96 mEq/L (ref 96–112)
Creatinine, Ser: 1.01 mg/dL (ref 0.50–1.35)
Glucose, Bld: 101 mg/dL — ABNORMAL HIGH (ref 70–99)
Potassium: 3.7 mEq/L (ref 3.5–5.1)

## 2010-10-24 ENCOUNTER — Other Ambulatory Visit: Payer: Self-pay | Admitting: Surgery

## 2010-10-24 ENCOUNTER — Inpatient Hospital Stay (HOSPITAL_COMMUNITY): Payer: Medicare Other

## 2010-10-24 DIAGNOSIS — I359 Nonrheumatic aortic valve disorder, unspecified: Secondary | ICD-10-CM

## 2010-10-24 HISTORY — PX: AORTIC VALVE REPLACEMENT: SHX41

## 2010-10-24 LAB — POCT I-STAT 3, ART BLOOD GAS (G3+)
Acid-Base Excess: 5 mmol/L — ABNORMAL HIGH (ref 0.0–2.0)
Acid-Base Excess: 1 mmol/L (ref 0.0–2.0)
Bicarbonate: 29.1 mEq/L — ABNORMAL HIGH (ref 20.0–24.0)
Bicarbonate: 25.5 mEq/L — ABNORMAL HIGH (ref 20.0–24.0)
O2 Saturation: 100 %
O2 Saturation: 98 %
Patient temperature: 36.4
Patient temperature: 36.6
TCO2: 30 mmol/L (ref 0–100)
TCO2: 27 mmol/L (ref 0–100)
pH, Arterial: 7.472 — ABNORMAL HIGH (ref 7.350–7.450)
pH, Arterial: 7.417 (ref 7.350–7.450)

## 2010-10-24 LAB — HEMOGLOBIN AND HEMATOCRIT, BLOOD
HCT: 21.1 % — ABNORMAL LOW (ref 39.0–52.0)
Hemoglobin: 7.5 g/dL — ABNORMAL LOW (ref 13.0–17.0)

## 2010-10-24 LAB — CBC
HCT: 29.4 % — ABNORMAL LOW (ref 39.0–52.0)
Hemoglobin: 10.5 g/dL — ABNORMAL LOW (ref 13.0–17.0)
Hemoglobin: 9.6 g/dL — ABNORMAL LOW (ref 13.0–17.0)
MCH: 34.4 pg — ABNORMAL HIGH (ref 26.0–34.0)
MCHC: 35.7 g/dL (ref 30.0–36.0)
MCHC: 35.7 g/dL (ref 30.0–36.0)
MCV: 96.4 fL (ref 78.0–100.0)
RBC: 3.05 MIL/uL — ABNORMAL LOW (ref 4.22–5.81)
RBC: 2.79 MIL/uL — ABNORMAL LOW (ref 4.22–5.81)
WBC: 9.6 10*3/uL (ref 4.0–10.5)

## 2010-10-24 LAB — POCT I-STAT 4, (NA,K, GLUC, HGB,HCT)
Glucose, Bld: 96 mg/dL (ref 70–99)
Glucose, Bld: 89 mg/dL (ref 70–99)
Glucose, Bld: 141 mg/dL — ABNORMAL HIGH (ref 70–99)
HCT: 30 % — ABNORMAL LOW (ref 39.0–52.0)
HCT: 23 % — ABNORMAL LOW (ref 39.0–52.0)
HCT: 32 % — ABNORMAL LOW (ref 39.0–52.0)
Hemoglobin: 10.2 g/dL — ABNORMAL LOW (ref 13.0–17.0)
Hemoglobin: 8.2 g/dL — ABNORMAL LOW (ref 13.0–17.0)
Hemoglobin: 10.9 g/dL — ABNORMAL LOW (ref 13.0–17.0)
Potassium: 3.3 mEq/L — ABNORMAL LOW (ref 3.5–5.1)
Potassium: 3.6 mEq/L (ref 3.5–5.1)
Potassium: 3.2 mEq/L — ABNORMAL LOW (ref 3.5–5.1)
Sodium: 137 mEq/L (ref 135–145)
Sodium: 139 mEq/L (ref 135–145)
Sodium: 140 mEq/L (ref 135–145)

## 2010-10-24 LAB — MAGNESIUM: Magnesium: 2.9 mg/dL — ABNORMAL HIGH (ref 1.5–2.5)

## 2010-10-24 LAB — POCT I-STAT, CHEM 8
BUN: 15 mg/dL (ref 6–23)
Creatinine, Ser: 0.8 mg/dL (ref 0.50–1.35)
Glucose, Bld: 113 mg/dL — ABNORMAL HIGH (ref 70–99)
Hemoglobin: 9.5 g/dL — ABNORMAL LOW (ref 13.0–17.0)
Sodium: 140 mEq/L (ref 135–145)
TCO2: 22 mmol/L (ref 0–100)

## 2010-10-24 LAB — PLATELET COUNT: Platelets: 82 10*3/uL — ABNORMAL LOW (ref 150–400)

## 2010-10-24 LAB — SURGICAL PCR SCREEN: Staphylococcus aureus: NEGATIVE

## 2010-10-24 LAB — CREATININE, SERUM: Creatinine, Ser: 0.69 mg/dL (ref 0.50–1.35)

## 2010-10-24 LAB — PROTIME-INR: Prothrombin Time: 18.9 seconds — ABNORMAL HIGH (ref 11.6–15.2)

## 2010-10-24 NOTE — Consult Note (Signed)
[name], Griffith NO.:  [id number]  MEDICAL RECORD NO.:  [id number]  LOCATION:  3701                         FACILITY:  [facility]  PHYSICIAN:  Evelene Croon, M.D.     DATE OF BIRTH:  Aug 23, 1926  DATE OF CONSULTATION:  10/20/2010 DATE OF DISCHARGE:                                CONSULTATION   REASON FOR CONSULTATION:  Critical aortic stenosis.  REFERRING PHYSICIAN:  Peter M. [place]il[place], MD  CLINICAL HISTORY:  I was asked by Dr. Swaziland to evaluate [name] for consideration of redo sternotomy and aortic valve replacement.  He is an 77 year old gentleman who underwent coronary artery bypass graft surgery by me in 2000.  He has subsequently undergone multiple other operations including left carotid endarterectomy in 2006, after a stroke and placement of a stent graft for an abdominal aortic aneurysm in September 2009.  His last echocardiogram was performed in 2006, and showed calcification of his aortic valve, but minimal leaflet motion restriction and no significant gradient across the valve.  His ejection fraction at that time was 45%-50%.  He underwent a right inguinal hernia repair on October 13, 2010, and said that since then, he has had recurrent episodes of shortness of breath as well as orthopnea and PND. He was also found to be in atrial fibrillation with a controlled ventricular response.  He was admitted on October 16, 2010, with a chest x-ray showing a possible infiltrate in the right upper lobe.  He was started on antibiotics for possible pneumonia.  He also had a CT scan of the chest performed which showed moderate right pleural effusion and small left pleural effusion as well as bilateral infiltrates consistent with pulmonary edema.  His BNP was noted to be 16,158.  He subsequently underwent an echocardiogram on October 18, 2010, which showed heavily calcified aortic valve with a peak gradient of 58 mmHg and a mean gradient of 33 mmHg with a  calculated valve area of 0.44 cm2 by Vmax and 0.47 cm2 by VTI.  There was mild aortic insufficiency. There was global hypokinesis with mild left ventricular hypertrophy and estimated ejection fraction of 25%.  His aortic root appeared be a normal size.  There was mild mitral regurgitation.  Right ventricular cavity size was moderately dilated and systolic function was moderately reduced.  There was mild tricuspid regurgitation.  He subsequently underwent cardiac catheterization today which showed all of his native coronary arteries to be occluded.  The saphenous vein graft to the posterior descending was patent.  There was a sequential saphenous vein graft to the first diagonal and first obtuse marginal which was patent. There was saphenous vein graft to second obtuse marginal that was patent.  The left internal mammary graft to the LAD was widely patent. PA pressure was 46/26 with a wedge pressure of 24.  The valve area was calculated at 0.9 cm2.  Cardiac output was 5.1 with an index of 2.56 by Fick.  Cardiac output by thermodilution was 3.56 with an index of 1.77. Ejection fraction was about 25% with global hypokinesis.  REVIEW OF SYSTEMS:  GENERAL:  He denies any fever or chills.  He has had no recent weight changes.  He does report fatigue, although he goes to the gym 4 or 5 days per week.  EYES:  No recent changes.  ENT:  He does see a dentist regularly.  ENDOCRINE:  He denies diabetes and hypothyroidism.  CARDIOVASCULAR:  He has had no chest pain or pressure. He has had exertional dyspnea as well as dyspnea at rest with PND or orthopnea.  He has a very mild peripheral edema, worse in the right leg. He denies palpitations.  RESPIRATORY:  He denies cough and sputum production.  GI:  He has had no nausea or vomiting.  Denies melena and bright red blood per rectum.  GU:  He denies dysuria and hematuria. MUSCULOSKELETAL:  He denies arthralgias and myalgias.  NEUROLOGICAL:  He denies any  recent focal weakness or numbness.  He has had no dizziness or syncope.  He did have prior stroke before his carotid endarterectomy and possibly a stroke immediately afterwards.  ALLERGIES:  None. PSYCHIATRIC:  Negative.  HEMATOLOGICAL:  Negative.  MEDICATIONS PRIOR TO ADMISSION: 1. Coreg 6.25 mg b.i.d. 2. Aspirin 81 mg daily. 3. Multivitamin daily.  PAST MEDICAL HISTORY:  Significant for: 1. Hypertension. 2. History of gout. 3. He is status post coronary artery bypass graft surgery x5 in 2000. 4. He is status post stent grafting for abdominal aortic aneurysm in     September 2009. 5. He is status post right total knee replacement in July 2008. 6. Status post left carotid endarterectomy in September 2006. 7. He subsequently was re-explored and had revision of his distal     endarterectomy site for postoperative neurologic deficit.  He was     then taken back the next day for evacuation of a hematoma from the     neck. 8. He had a laparoscopic-assisted right hemicolectomy in July 2011,     for a right colon mass with tubulovillous adenoma. 9. He underwent a procedure for attempted embolization and occlusion     of vessels feeding a type 2 endoleak around his abdominal aortic     stent graft last year performed by Dr. Fredia Sorrow. 10.He is also status post right inguinal hernia repair on October 13, 2010.  FAMILY HISTORY:  Positive for coronary artery disease.  SOCIAL HISTORY:  He is retired, lives with his wife.  He smokes about 4- 6 cigars per day and drinks about 4 cans of Budweiser per night.  PHYSICAL EXAMINATION:  VITAL SIGNS:  Blood pressure is 114/85, pulse is 72 and regular, respiratory rate is 16 and unlabored. GENERAL:  He is an elderly, but well-developed white male in no distress. HEENT:  Normocephalic and atraumatic.  Pupils are equal and reactive to light and accommodation.  Extraocular muscles are intact.  Oropharynx is clear.  Teeth are in fair  condition. NECK:  Normal carotid pulses bilaterally.  There is a transmitted murmur at both sides of the neck.  There is a left neck scar from carotid endarterectomy.  There is no adenopathy or thyromegaly. CARDIAC:  Regular rate and rhythm with a grade 3/6 systolic murmur of aortic stenosis.  There is no diastolic murmur.  There is a well-healed sternotomy incision.  Sternum feels stable. LUNGS:  Clear. ABDOMEN:  Active bowel sounds.  His abdomen is soft, flat, and nontender.  There are no palpable masses or organomegaly.  There is an intact scar in the right lower quadrant from hernia repair. EXTREMITIES:  Trace ankle edema bilaterally.  There is an old scar from saphenous vein  harvest in the right leg from the ankle to the mid thigh. Pedal pulses are diminished, but palpable. NEUROLOGIC:  Alert and oriented x3.  Motor and sensory exam is grossly normal.  Laboratory examination shows normal electrolytes with a BUN of 22, creatinine of 0.85.  White blood cell count 5.7, hemoglobin 11.6, platelet count 145,000.  INR is 1.20.  Liver function profile is within normal limits with an albumin of 3.8.  His surgical PCR screen was negative.  IMPRESSION:  Mr. Mitton has critical aortic stenosis with patent grafts status post coronary artery bypass graft surgery in 2000.  He presents with new-onset shortness of breath and pulmonary edema by chest x-ray and catheterization.  He was started on antibiotics for possible pneumonia after admission, but I doubt this is pneumonia, most likely congestive heart failure with pulmonary edema.  He has had significant decrease in his left ventricular ejection fraction to 25%.  He has mild- to-moderate pulmonary hypertension and some right ventricular dysfunction.  I agree that aortic valve replacement is the best treatment to prevent progression of his congestive heart failure symptoms ultimately leading to death.  He has also already had significant left  ventricular deterioration.  I will plan to use a tissue valve given his age.  I discussed the operative procedure with the patient and his wife including indications for surgery, benefits and risks including but not limited to bleeding, blood transfusion, infection, stroke, myocardial infarction, graft failure, heart block requiring permanent pacemaker, organ dysfunction, and death.  He understands and would like to proceed with surgery.  We will tentatively plan to perform surgery on Monday, October 24, 2010.     Evelene Croon, M.D.     BB/MEDQ  D:  10/20/2010  T:  10/20/2010  Job:  [id number]  cc:   Peter M. [place], M.D.  Electronically Signed by Evelene Croon M.D. on 10/24/2010 03:46:34 PM

## 2010-10-25 ENCOUNTER — Inpatient Hospital Stay (HOSPITAL_COMMUNITY): Payer: Medicare Other

## 2010-10-25 LAB — CBC
HCT: 24.5 % — ABNORMAL LOW (ref 39.0–52.0)
Hemoglobin: 8.6 g/dL — ABNORMAL LOW (ref 13.0–17.0)
MCH: 34.1 pg — ABNORMAL HIGH (ref 26.0–34.0)
MCV: 97.2 fL (ref 78.0–100.0)
RBC: 2.52 MIL/uL — ABNORMAL LOW (ref 4.22–5.81)
WBC: 7.9 10*3/uL (ref 4.0–10.5)

## 2010-10-25 LAB — GLUCOSE, CAPILLARY
Glucose-Capillary: 100 mg/dL — ABNORMAL HIGH (ref 70–99)
Glucose-Capillary: 122 mg/dL — ABNORMAL HIGH (ref 70–99)
Glucose-Capillary: 122 mg/dL — ABNORMAL HIGH (ref 70–99)
Glucose-Capillary: 138 mg/dL — ABNORMAL HIGH (ref 70–99)
Glucose-Capillary: 109 mg/dL — ABNORMAL HIGH (ref 70–99)
Glucose-Capillary: 110 mg/dL — ABNORMAL HIGH (ref 70–99)
Glucose-Capillary: 84 mg/dL (ref 70–99)
Glucose-Capillary: 94 mg/dL (ref 70–99)
Glucose-Capillary: 95 mg/dL (ref 70–99)
Glucose-Capillary: 123 mg/dL — ABNORMAL HIGH (ref 70–99)

## 2010-10-25 LAB — BASIC METABOLIC PANEL
BUN: 17 mg/dL (ref 6–23)
CO2: 26 mEq/L (ref 19–32)
Calcium: 8.3 mg/dL — ABNORMAL LOW (ref 8.4–10.5)
Chloride: 105 mEq/L (ref 96–112)
Creatinine, Ser: 0.7 mg/dL (ref 0.50–1.35)
Glucose, Bld: 98 mg/dL (ref 70–99)

## 2010-10-25 LAB — PREPARE PLATELET PHERESIS

## 2010-10-25 LAB — MAGNESIUM: Magnesium: 2.5 mg/dL (ref 1.5–2.5)

## 2010-10-26 ENCOUNTER — Inpatient Hospital Stay (HOSPITAL_COMMUNITY): Payer: Medicare Other

## 2010-10-26 LAB — CBC
Hemoglobin: 8.2 g/dL — ABNORMAL LOW (ref 13.0–17.0)
MCHC: 34.7 g/dL (ref 30.0–36.0)
RBC: 2.37 MIL/uL — ABNORMAL LOW (ref 4.22–5.81)

## 2010-10-26 LAB — GLUCOSE, CAPILLARY
Glucose-Capillary: 101 mg/dL — ABNORMAL HIGH (ref 70–99)
Glucose-Capillary: 112 mg/dL — ABNORMAL HIGH (ref 70–99)
Glucose-Capillary: 122 mg/dL — ABNORMAL HIGH (ref 70–99)

## 2010-10-26 LAB — CROSSMATCH
Donor AG Type: NEGATIVE
Donor AG Type: NEGATIVE
Unit division: 0
Unit division: 0
Unit division: 0

## 2010-10-26 LAB — PROTIME-INR
INR: 1.4 (ref 0.00–1.49)
Prothrombin Time: 17.4 seconds — ABNORMAL HIGH (ref 11.6–15.2)

## 2010-10-26 LAB — BASIC METABOLIC PANEL
Chloride: 103 mEq/L (ref 96–112)
Creatinine, Ser: 1.09 mg/dL (ref 0.50–1.35)
GFR calc Af Amer: >60 mL/min (ref >60)
GFR calc non Af Amer: >60 mL/min (ref >60)

## 2010-10-27 LAB — BASIC METABOLIC PANEL
BUN: 32 mg/dL — ABNORMAL HIGH (ref 6–23)
CO2: 26 mEq/L (ref 19–32)
Calcium: 9.2 mg/dL (ref 8.4–10.5)
Creatinine, Ser: 1.09 mg/dL (ref 0.50–1.35)
Glucose, Bld: 107 mg/dL — ABNORMAL HIGH (ref 70–99)

## 2010-10-27 LAB — CBC
HCT: 24.3 % — ABNORMAL LOW (ref 39.0–52.0)
MCH: 34 pg (ref 26.0–34.0)
MCHC: 34.2 g/dL (ref 30.0–36.0)
MCV: 99.6 fL (ref 78.0–100.0)
RDW: 14.3 % (ref 11.5–15.5)

## 2010-10-27 LAB — PROTIME-INR: INR: 1.24 (ref 0.00–1.49)

## 2010-10-28 ENCOUNTER — Other Ambulatory Visit: Payer: Medicare Other

## 2010-10-28 LAB — PROTIME-INR: INR: 1.36 (ref 0.00–1.49)

## 2010-10-28 LAB — CBC
HCT: 22.8 % — ABNORMAL LOW (ref 39.0–52.0)
MCH: 33.6 pg (ref 26.0–34.0)
MCV: 98.3 fL (ref 78.0–100.0)
Platelets: 120 10*3/uL — ABNORMAL LOW (ref 150–400)
RDW: 14.4 % (ref 11.5–15.5)

## 2010-10-29 LAB — CROSSMATCH
ABO/RH(D): O POS
DAT, IgG: NEGATIVE
Donor AG Type: NEGATIVE
Unit division: 0

## 2010-10-29 LAB — CBC
HCT: 27.9 % — ABNORMAL LOW (ref 39.0–52.0)
Hemoglobin: 9.7 g/dL — ABNORMAL LOW (ref 13.0–17.0)
MCH: 33.7 pg (ref 26.0–34.0)
MCHC: 34.8 g/dL (ref 30.0–36.0)
RDW: 15.1 % (ref 11.5–15.5)

## 2010-10-29 LAB — PROTIME-INR: INR: 1.49 (ref 0.00–1.49)

## 2010-10-31 ENCOUNTER — Ambulatory Visit (INDEPENDENT_AMBULATORY_CARE_PROVIDER_SITE_OTHER): Payer: Self-pay | Admitting: Internal Medicine

## 2010-10-31 DIAGNOSIS — R0989 Other specified symptoms and signs involving the circulatory and respiratory systems: Secondary | ICD-10-CM

## 2010-10-31 DIAGNOSIS — I482 Chronic atrial fibrillation, unspecified: Secondary | ICD-10-CM | POA: Insufficient documentation

## 2010-10-31 DIAGNOSIS — Z7901 Long term (current) use of anticoagulants: Secondary | ICD-10-CM | POA: Insufficient documentation

## 2010-10-31 LAB — POCT INR: INR: 2

## 2010-11-03 ENCOUNTER — Encounter: Payer: Self-pay | Admitting: Cardiology

## 2010-11-03 ENCOUNTER — Telehealth: Payer: Self-pay | Admitting: Cardiology

## 2010-11-03 NOTE — Telephone Encounter (Signed)
I had left message yesterday to call for them to make an app. Gave her app for 10/3. States [name] is still having problem with "getting a deep breath". States he was like this in hospital after surgery. Breathing has not gotten any worse. [facility] was out yest and advised her to call us. States BP and P nml. Swelling in legs has gone down. Advised her to call Dr. Laneta Simmers 's office to get further advise. Advised not uncommon to have some fluid build up after surgery but they can better advise her.

## 2010-11-03 NOTE — Telephone Encounter (Signed)
Patient calling back to speak with nurse.

## 2010-11-03 NOTE — Telephone Encounter (Signed)
Pt wife calling wanting to inform MD that pt is having difficulty getting a "good breath" at times. Please return call to discuss further.

## 2010-11-04 ENCOUNTER — Other Ambulatory Visit: Payer: Self-pay | Admitting: Surgery

## 2010-11-04 ENCOUNTER — Ambulatory Visit
Admission: RE | Admit: 2010-11-04 | Discharge: 2010-11-04 | Disposition: A | Discharge status: Home / Self Care | Payer: Medicare Other | Source: Ambulatory Visit | Attending: Surgery | Admitting: Surgery

## 2010-11-04 ENCOUNTER — Ambulatory Visit (INDEPENDENT_AMBULATORY_CARE_PROVIDER_SITE_OTHER): Payer: Self-pay

## 2010-11-04 ENCOUNTER — Other Ambulatory Visit: Payer: Self-pay | Admitting: *Deleted

## 2010-11-04 VITALS — BP 118/64 | HR 64 | Temp 96.8°F | Resp 16

## 2010-11-04 DIAGNOSIS — I359 Nonrheumatic aortic valve disorder, unspecified: Secondary | ICD-10-CM

## 2010-11-04 DIAGNOSIS — R0602 Shortness of breath: Secondary | ICD-10-CM

## 2010-11-04 LAB — POCT INR: INR: 1.7

## 2010-11-04 NOTE — Progress Notes (Deleted)
Wife called 11/03/10 with concerns that [name] c/o not being able to take or get a deep breath.  This was actually a problem before surgery.  I discussed with Dr. Bartle that day and he ordered cxr for today to r/o pl eff.  Cxr was done and was actually improved from discharge.  I reported this to Dr. Bartle and he advised [name] to use his incentive spirometry more and they agreed.  On exam, besides the minimal LE edema, there is a skin rash/red blotching of the right foot.  This may be starting on the left foot also. He is concerned it may be reaction to the amiodarone. I will notify Dr. Bartle of this and advise them.  I also instructed him in proper elevation. Unless otherwise instructed, he will return as scheduled. 

## 2010-11-04 NOTE — Progress Notes (Signed)
Wife called 11/03/10 with concerns that [name] c/o not being able to take or get a deep breath.  This was actually a problem before surgery.  I discussed with Dr. Laneta Simmers that day and he ordered cxr for today to r/o pl eff.  Cxr was done and was actually improved from discharge.  I reported this to Dr. Laneta Simmers and he advised [name] to use his incentive spirometry more and they agreed.  On exam, besides the minimal LE edema, there is a skin rash/red blotching of the right foot.  This may be starting on the left foot also. He is concerned it may be reaction to the amiodarone. I will notify Dr. Laneta Simmers of this and advise them.  I also instructed him in proper elevation. Unless otherwise instructed, he will return as scheduled.

## 2010-11-06 NOTE — H&P (Addendum)
NAME:  [name], [name] NO.:  [id number]  MEDICAL RECORD NO.:  [id number]  LOCATION:  [facility]                         FACILITY:  [facility]  PHYSICIAN:  Dr. Sw[place]iland             DATE OF BIRTH:  Jan 30, 1927  DATE OF ADMISSION:  10/16/2010 DATE OF DISCHARGE:                             HISTORY & PHYSICAL   CHIEF COMPLAINT:  Shortness of breath.  HISTORY OF PRESENT ILLNESS:  The patient is an 77 year old white male with a past medical history significant for coronary artery disease status post CABG, peripheral arterial disease status post left CEA, aortic aneurysm status post stent graft, CVA prior to his CEA, who is presenting with several episodes of new-onset dyspnea.  The patient states over the past several days, he has had several episodes of dyspnea lasting 5-10 minutes.  He denies any palpitations, chest pain, or chest discomfort associated with the dyspnea.  It has often worsened when lying flat when compared to the upright position.  He also endorses paroxysmal nocturnal dyspnea.  He has had no recent changes to his medications.  In the interim, he was found to be in atrial fibrillation with a controlled ventricular response.  CT scan of the chest revealed interstitial abnormalities most consistent with pulmonary edema and a moderate right-sided pleural effusion.  PAST MEDICAL HISTORY:  As above in HPI.  SOCIAL HISTORY:  He smokes several cigars everyday and drinks approximately four Budweiser's everyday.  FAMILY HISTORY:  Positive for coronary artery disease.  ALLERGIES:  No known drug allergies.  MEDICATIONS:  Aspirin 81 mg daily, Coreg 6.25 mg b.i.d., and a multivitamin.  REVIEW OF SYSTEMS:  Positive for recent hernia surgery approximately 4 days ago.  Other review of systems as in HPI, otherwise negative.  PHYSICAL EXAMINATION:  VITAL SIGNS:  Temperature is 98.2, blood pressure 115/82, heart rate is 89, respiratory rate 16, satting 99% on 2  L. GENERAL:  No acute distress. HEENT:  Normocephalic, atraumatic. NECK:  Supple.  There is JVD. HEART:  Irregularly irregular. LUNGS:  Clear bilaterally. ABDOMEN:  Soft, nontender. EXTREMITIES:  1+ bilateral lower extremity edema. PSYCHIATRIC:  The patient is appropriate. SKIN:  Warm and dry. NEURO:  Nonfocal.  LABORATORY DATA:  BMP is unremarkable.  CBC is unremarkable with a hemoglobin of 13, hematocrit of 36, troponin is less than 0.30, BNP is 16,158.  CK is 104, CK-MB 5.3.  CT of the chest shows pulmonary edema with a moderate right-sided pleural effusion.  EKG independently interpreted by myself demonstrates atrial fibrillation with PVCs and delayed R-wave progression.  There is left bundle-branch block.  ASSESSMENT: 1. Dyspnea likely secondary to acute-on-chronic diastolic heart     failure and atrial fibrillation. 2. Newly diagnosed atrial fibrillation. 3. Mild volume overload. 4. Right-sided pleural effusion. 5. History of coronary artery disease. 6. History of peripheral arterial disease.  PLAN:  The patient will be admitted and ruled out for myocardial infarction, although I think this is an unlikely diagnosis.  He will be diuresed with Lasix 40 mg IV daily.  For now, I will place him on heparin for the atrial fibrillation, especially in light of his history of strokes  and now congestive heart failure.  His Coreg dose at 6.25 mg b.i.d. will be continued.  He will be placed on telemetry and we will check a transthoracic echocardiogram.     Brayton El, MD   ______________________________ Dr. [place]    SGA/MEDQ  D:  10/16/2010  T:  10/16/2010  Job:  [id number]  Electronically Signed by Raynelle Bring MD on 11/06/2010 10:00:42 AM Electronically Signed by Halee Glynn [place] M.D. on 11/08/2010 01:31:44 PM

## 2010-11-07 ENCOUNTER — Ambulatory Visit (INDEPENDENT_AMBULATORY_CARE_PROVIDER_SITE_OTHER): Payer: Self-pay | Admitting: Cardiovascular Disease

## 2010-11-07 DIAGNOSIS — R0989 Other specified symptoms and signs involving the circulatory and respiratory systems: Secondary | ICD-10-CM

## 2010-11-08 ENCOUNTER — Encounter: Payer: Self-pay | Admitting: *Deleted

## 2010-11-08 DIAGNOSIS — I359 Nonrheumatic aortic valve disorder, unspecified: Secondary | ICD-10-CM | POA: Insufficient documentation

## 2010-11-10 ENCOUNTER — Encounter: Payer: Self-pay | Admitting: Vascular Surgery

## 2010-11-10 NOTE — Op Note (Signed)
[name], Patrick NO.:  [id number]  MEDICAL RECORD NO.:  [id number]  LOCATION:  2310                         FACILITY:  [facility]  PHYSICIAN:  Evelene Croon, M.D.     DATE OF BIRTH:  [birth year]  DATE OF PROCEDURE:  10/24/2010 DATE OF DISCHARGE:                              OPERATIVE REPORT   PREOPERATIVE DIAGNOSIS:  Critical aortic stenosis, status post coronary artery bypass graft surgery in 2000.  POSTOPERATIVE DIAGNOSIS:  Critical aortic stenosis, status post coronary artery bypass graft surgery in 2000.  OPERATIVE PROCEDURE:  Redo median sternotomy, extracorporeal circulation, aortic valve replacement using a 25-mm Edwards pericardial Magna-Ease valve.  ATTENDING SURGEON:  Evelene Croon, MD  ASSISTANT:  Stephanie Acre Dasovich, PA-C  ANESTHESIA:  General endotracheal.  CLINICAL HISTORY:  This patient is an 77 year old gentleman who underwent coronary artery bypass surgery by me in 2000.  He subsequently underwent multiple other procedures including left carotid endarterectomy in 2006 after a stroke as well as placement of a stent graft for an abdominal aortic aneurysm in September 2009.  He had an echocardiogram in 2006 showing calcifications of the aortic valve with minimal leaflet motion restriction and gradient across the valve. Ejection fraction at that time was 45-50%.  He recently underwent a right inguinal hernia repair on October 13, 2010, and said since then he has had recurrent episodes of shortness of breath as well as orthopnea and PND.  He was found to be in atrial fibrillation with controlled ventricular response.  He was admitted on October 16, 2010, with a chest x-ray showing possible infiltrate in the right upper lobe.  He was treated for possible pneumonia with antibiotics.  A CT scan of the chest showed a moderate right pleural effusion as well as a small left pleural effusion and bilateral infiltrates consistent with pulmonary edema.   His BNP was elevated at 16,000.  He subsequently underwent echocardiogram on October 18, 2010, that showed heavily calcified aortic valve with peak gradient of 58 and mean gradient of 33 with a calculated valve area of 0.44 sq cm.  There was mild aortic insufficiency.  There was global hypokinesis with left ventricular ejection fraction of 25% and mild left ventricular hypertrophy.  There was mild mitral regurgitation.  The right ventricular cavity size was moderately dilated and systolic function was mildly reduced.  He had mild tricuspid regurgitation. Cardiac catheterization showed that all of his native coronary arteries were occluded.  The saphenous vein grafts to the posterior descending, sequential graft to the first diagonal and first obtuse marginal, and a saphenous vein graft to the second obtuse marginal were all patent. Left internal mammary graft of the LAD was widely patent.  PA pressure was mildly elevated at 46/26 with wedge pressure of 24.  His valve area was calculated to be 0.9 sq cm by cardiac cath.  After review of all the studies and examination of the patient, it was felt that redo sternotomy and aortic valve replacement was the best treatment to prevent progression of symptoms and left ventricular failure.  I discussed the operative procedure with the patient and his family including use of a tissue valve given his age.  We discussed  alternatives, benefits, and risks including but not limited to bleeding, blood transfusion, infection, stroke, myocardial infarction, graft failure, heart block requiring permanent pacemaker, organ dysfunction, and death.  His preoperative pulmonary function testing showed severe COPD as well as severe reduction in diffusion capacity.  Despite this, he had been quite active, going to gym several times per week and therefore I felt he will be an adequate operative candidate.  I discussed the importance of maximum cardiac risk factor  reduction with he and his family including complete smoking cessation.  He understood and agreed to proceed.  OPERATIVE PROCEDURE:  The patient was taken to the operating room and placed on the table in supine position.  After induction of general endotracheal anesthesia, a Foley catheter was placed in the bladder using sterile technique.  Then, the chest, abdomen, and both lower extremities were prepped and draped in the usual sterile manner.  The TEE was performed by Dr. Autumn Patty.  This showed severe to critical aortic stenosis with a calculated valve area of 0.4 sq cm. There was minimal leaflet mobility.  There was trivial mitral regurgitation.  The left ventricle was dilated and globally hypokinetic with ejection fraction about 25-30%.  Then, the chest was opened through the previous median sternotomy incision.  Sternal wires were removed.  The sternum was opened using an oscillating saw without difficulty.  Bone hooks were used to retract the sternum and the heart, and great vessels were dissected from the back of the sternum without difficulty.  Chest retractor was placed.  Then, dissection was performed to expose the right atrium and ascending aorta. The patient had relatively long ascending aorta.  The grafts were identified and carefully preserved.  The patient was then heparinized and when an adequate ACT was obtained the distal ascending aorta was cannulated using a 20-French aortic cannula for arterial inflow.  Venous outflow was achieved using a 2-stage venous cannula through the right atrial appendage.  An antegrade cardioplegia and vent cannula was inserted in the aortic root.  The patient was then placed on cardiopulmonary bypass and the remainder of the right side of the heart was dissected free.  Then, a left ventricular vent was placed through right superior pulmonary vein and a retrograde cardioplegic cannula was placed through a purse-string suture in the  right atrium.  Then, the left anterior descending artery was identified and traced proximally until I found the left internal mammary pedicle entering the pericardial cavity.  This was carefully encircled.  Then, the patient was cooled to 32 degrees centigrade.  An atraumatic clamp was placed across the left internal mammary pedicle.  The aorta was then crossclamped and 1000 mL of cold blood antegrade cardioplegia was administered in the aortic root with quick arrest of the heart. Topical hypothermic iced saline was used.  A temperature probe was placed in the septum.  Then, for approximately every 20 minutes, a dose of cold blood retrograde cardioplegia was given.  Then, the aorta was opened transversely just above the sinotubular junction.  There was still adequate room between the aortotomy and the grafts.  Examination of the native valve showed that there were three leaflets that were completely calcified and immobile.  There was a small slip between the leaflets allowing blood flow.  The left and right coronary ostia were identified and were partially obstructed with calcified plaque.  Then, the native valve was excised.  Care was taken to remove all particulate debris.  The annulus was decalcified with rongeurs.  The  left ventricle and aortic root were irrigated with a large amount of iced saline solution and directly inspected to be sure there was no debris.  Then, the annulus was sized and a 25-mm Edwards pericardial Magna-Ease valve was chosen.  This had model number [id number], serial number [id number].  Then, a series of pledgeted 2-0 Ethibond horizontal mattress suture was placed around the aortic annulus with the pledgets in the subannular position.  The suture was then placed through the sewing ring with valve lowered in place.  The suture was tied sequentially.  The valve seated nicely.  The right and left coronary ostia were not obstructed.  Then, the patient was rewarmed to  37 degrees centigrade.  The aortotomy was closed in two layers using continuous 4-0 Prolene suture with felt strips to reinforce closure.  Then, the left side of the heart was deaired.  The head was placed in Trendelenburg position.  We did use CO2 insufflation to the pericardial cavity throughout the case to minimize intracardiac air.  Then, after deairing maneuvers, the clamp was removed from the mammary pedicle.  Crossclamp was removed with a time of 62 minutes and the patient spontaneous returned to ventricular fibrillation and then converted sinus rhythm on his own.  The aortotomy appeared hemostatic.  The saphenous vein grafts all appeared unremarkable.  Then, two temporary right ventricular and right atrial pacing wires were placed and brought out through the skin.  When the patient rewarmed to 37 degrees centigrade, he was weaned from cardiopulmonary bypass on a low-dose dopamine.  Total bypass time was 125 minutes.  Cardiac function appeared improved by TEE.  Cardiac output was 5 liters per minute.  TEE showed normal functioning aortic valve prosthesis without evidence of perivalvular leak or regurgitation through the valve.  There was trivial mitral regurgitation.  Protamine was then given.  The patient was given 1 unit of platelets after this for a platelet count of 60,000.  Hemostasis was achieved.  Three chest tubes were placed with a tube in the posterior pericardium, one in the anterior mediastinum, and one in the right pleural space.  The sternum was then closed with double #6 stainless steel wires.  Fascia was closed with continuous #1 Vicryl suture.  Subcutaneous tissue was closed with continuous 2-0 Vicryl and the skin with 3-0 Vicryl subcuticular closure. The sponge, needle, and instrument counts were correct according to the scrub nurse.  Dry sterile dressing was applied over the incision and around the chest tubes which were hooked with Pleur-Evac suction.   The patient remained hemodynamically stable and transferred to the SICU in guarded but stable condition.     Evelene Croon, M.D.     BB/MEDQ  D:  10/24/2010  T:  10/24/2010  Job:  [id number]  Electronically Signed by Evelene Croon M.D. on 11/10/2010 04:28:17 PM

## 2010-11-10 NOTE — Discharge Summary (Signed)
[name], SEDLER NO.:  [id number]  MEDICAL RECORD NO.:  [id number]  LOCATION:  2017                         FACILITY:  [facility]  PHYSICIAN:  Evelene Croon, M.D.     DATE OF BIRTH:  04/22/1926  DATE OF ADMISSION:  10/16/2010 DATE OF DISCHARGE:                              DISCHARGE SUMMARY   ADMITTING DIAGNOSES: 1. Critical aortic stenosis (ejection fraction was about 25%). 2. History of hypertension. 3. History of coronary artery disease (status post coronary artery     bypass graft x5 in 2000). 4. History of abdominal aortic aneurysm (status post stent graft in     September 2009. 5. History of gout. 6. History of left internal carotid artery stenosis (status post left     carotid endarterectomy in September 2006). 7. History of right colon mass (tubulovillous adenoma), status post     lap-assisted right hemicolectomy in July 2011. 8. Attempted embolization occlusion of vessels for a type 2 endoleak     around the abdominal stent graft. 9. History of tobacco abuse. 10.Right upper lobe infiltrate (possible pneumonia).  DISCHARGE DIAGNOSES: 1. Critical aortic stenosis (ejection fraction was about 25%). 2. History of hypertension. 3. History of coronary artery disease (status post coronary artery     bypass graft x5 in 2000). 4. History of abdominal aortic aneurysm (status post stent graft in     September 2009. 5. History of gout. 6. History of left internal carotid artery stenosis (status post left     carotid endarterectomy in September 2006). 7. History of right colon mass (tubulovillous adenoma), status post     lap-assisted right hemicolectomy in July 2011. 8. Attempted embolization occlusion of vessels for a type 2 endoleak     around the abdominal stent graft. 9. History of tobacco abuse. 10.Right upper lobe infiltrate (possible pneumonia). 11.Postop atrial fibrillation (conversion to normal sinus rhythm). 12.Acute blood loss  anemia. 13.Thrombocytopenia.  PROCEDURES: 1. Cardiac catheterization performed by Dr. [place] on October 20, 2010. 2. Redo median sternotomy for aortic valve replacement (a Magna Ease     pericardial tissue valve, size 25 mm by Dr. Laneta Simmers on October 24, 2010).  HISTORY OF PRESENT ILLNESS:  This is an 77 year old male with the aforementioned past medical history.  His last echocardiogram was done in 2006.  At that time, he was found to have calcification of his aortic valve, but minimal leaflet motion restriction and no significant gradient across the valve.  His EF at that time was approximately 45- 50%.  He has had recurrent episodes of shortness of breath as well as orthopnea and PND.  He also was found to have atrial fibrillation with a controlled ventricular rate.   Patient was admitted  on October 16, 2010, for an infiltrate in the right upper lobe and was started on antibiotics for possible pneumonia.  A CT scan of the chest that was done at that time showed a moderate right pleural effusion, small left pleural effusion, and bilateral infiltrates consistent with pulmonary edema.  His BMP was also found to be 16158.    He then underwent an echocardiogram on October 18, 2010, which  showed heavily calcified aortic valve with a peak gradient of 58 mmHg, a mean gradient of 33 mmHg and a calculated valve area of 0.44 centimeter squared.  There was mild aortic insufficiency, global hypokinesis with mild left ventricular hypertrophy and his EF was estimated to be 25%.  There was mild MR and TR and his aortic root appeared to be of normal size.  He then underwent a cardiac catheterization on October 20, 2010.  All of his native coronary arteries were found to be occluded.  The saphenous vein graft to the posterior descending coronary artery was patent as well as a sequential saphenous vein graft to diagonal 1 and OM-1.  Finally, a saphenous vein graft to OM-2 as well as the  LIMA to LAD were also patent.  His aortic valve area was calculated at 0.9 centimeter squared and his EF was estimated to be about 25% with global hypokinesis.  A cardiothoracic consultation was obtained with Dr. Laneta Simmers for the consideration of aortic valve replacement for his critical aortic stenosis.  Potential risks, complications, and benefits of the surgery were discussed with the patient and he agreed to proceed with surgery. It should be noted that prior to undergoing surgery, a duplex carotid ultrasound revealed no significant internal carotid artery stenosis bilaterally.  His ABIs were also found to 0.96 on the right and 1.08 on the left.  The patient underwent aortic valve replacement by Dr. Laneta Simmers on October 24, 2010.  BRIEF HOSPITAL COURSE STAY:  The patient was extubated without difficulty the evening of surgery.  He was found to be in atrial fibrillation with a controlled ventricular rate. He was being VVI paced at 80 and this was then turned down to 50.  He was given an amiodarone 150 mg IV bolus and then started on amiodarone 400 mg p.o. two times daily.  He was started on Coreg 6.25 mg p.o. two times daily as well.He did convert to sinus rhythm.  His Swan-Ganz, A-line, chest tubes, and Foley were all removed early in his postoperative course.  He was found to have acute blood loss anemia.  His H and H went as low as 7.8 and 22.8.  He did receive a transfusion.  He was also found to have thrombocytopenia postoperatively.  His platelet count went as low as 97,000.  However, his last platelet count was up to 120,000.  The patient then went back into atrial fibrillation and was started on Coumadin.  He also did experience sinus bradycardia, which did resolve. His PT and INR were monitored daily.  He continued to progress with cardiac rehab.  It was felt surgically stable for transfer from the Intensive Care Unit to [facility] for further convalescence on October 25, 2010.  He  was found to be volume overloaded and diuresed accordingly. Currently on postop day #4, he is afebrile and his vital signs are stable (on tele, he has sinus rhythm, heart rate in the 60s and his O2 sat is 98% on room air).  PHYSICAL EXAMINATION:  CARDIOVASCULAR:  Regular rate and rhythm. LUNGS:  Clear. ABDOMEN:  Benign. EXTREMITIES:  Trace lower extremity edema.  The sternal and lower extremity incisions are clean, dry and continuing to heal.  He has already been tolerating a diet and has had a bowel movement. Provided he remains afebrile, hemodynamically stable and pending morning round of evaluation, he will be surgically stable for discharge on October 29, 2010.  Prior to his discharge, epicardial pacing wires and chest tube sutures will be  removed.  Latest laboratory studies are as follows:  PT and INR done today 17 and 1.36.  CBC on this date H and H 7.8 and 22.8, white count 6700, platelet count 120,000.  BMET on this date; sodium 134, potassium 4.9, BUN and creatinine 32 and 1.09 respectively.  Last chest x-ray done on October 26, 2010, showed no pneumothorax, opacity to the right upper lung, small bilateral pleural effusions.  Discharge instructions include the following:  DIET:  The patient should remain on a low-sodium, heart-healthy.  ACTIVITY:  The patient may walk up steps.  He may shower.  He is not to drive until after 4 weeks.  He is not to lift anything over 10 pounds until after 4 weeks.  He is to continue with his breathing exercise daily.  He is to walk daily and increase his frequency and duration as tolerates.  WOUND CARE:  He may shower.  He is to cleanse his wounds with mild soap and water.  He is to call the office if any wound problems arise.  FOLLOWUP APPOINTMENTS: 1. The patient is going to have to have a PT and INR drawn 48 hours     after discharge.  He will either contact Dr. Elvis Coil office to     have it drawn at his office or if home  health is arranged.  They     may draw and fax the results to him. 2. The patient needs to contact Dr. Elvis Coil office for follow up     appointment in 2 weeks. 3. The patient has appointment to see Dr. Laneta Simmers on November 21, 2010 at     12:30 p.m., 45 minutes prior to this office appointment, a chest x-     ray will be obtained.  DISCHARGE MEDICATIONS AT THE TIME OF DICTATION: 1. Amiodarone 400 mg p.o. two times daily for 14 days, then amiodarone     200 mg two times daily thereafter. 2. Ferrous gluconate 324 mg p.o. two times daily. 3. Lasix 40 mg p.o. daily x3 days. 4. Potassium chloride 20 mEq p.o. daily x3 days. 5. Ramipril 5 mg p.o. daily. 6. Coumadin 5 mg p.o. every evening or as directed by Dr. Elvis Coil     office. 7. Enteric-coated aspirin 81 mg p.o. daily. 8. Coreg 6.25 mg p.o. two times daily. 9. Multivitamin p.o. daily. 10.Oxycodone/acetaminophen 5/325 one to two tablets p.o. q.4 h. p.r.n.     pain.     Doree Fudge, PA   ______________________________ Evelene Croon, M.D.    DZ/MEDQ  D:  10/28/2010  T:  10/28/2010  Job:  [id number]  cc:   Peter M. Swaziland, M.D.  Electronically Signed by Doree Fudge PA on 10/31/2010 09:18:08 AM Electronically Signed by Evelene Croon M.D. on 11/10/2010 04:28:15 PM

## 2010-11-10 NOTE — Discharge Summary (Signed)
  NAMEMarland Kitchen  Griffith, Patrick NO.:  [id number]  MEDICAL RECORD NO.:  [id number]  LOCATION:  2017                         FACILITY:  [facility]  PHYSICIAN:  Evelene Croon, M.D.     DATE OF BIRTH:  06-13-1926  DATE OF ADMISSION:  10/16/2010 DATE OF DISCHARGE:  10/30/2010                              DISCHARGE SUMMARY   ADDENDUM  Originally, [name] was scheduled for discharge home on October 29, 2010.  However, on the evening prior to discharge, the patient developed atrial fibrillation with slow ventricular rate and had bradycardic episodes down into the 30s which were asymptomatic.  He was seen by Cardiology and his Coreg dose was decreased to 3.125 mg b.i.d. and his amiodarone was continued at the decreased dose of 200 mg b.i.d.  He was observed for the next 24 hours and although he remained in rate- controlled atrial fibrillation, his heart rates did stabilize and at the time of this dictation have been in the 60s and 70s.  He has otherwise done well.  His INR on the date of discharge is 1.5 with a PT of 16.8. He has continued to remain afebrile and his other vital signs have been stable.  He is ambulating in the halls without difficulty.  His exam is unchanged from the previously dictated discharge summary.  He has been okayed by Cardiology to discharge home and follow up early with Dr. Sw[place]iland as well as the [facility] for followup of his anticoagulation.  He will be discharged home on October 30, 2010.  DISCHARGE INSTRUCTIONS:  Unchanged from the previously dictated discharge summary.  DISCHARGE MEDICATIONS: 1. Amiodarone 200 mg b.i.d. 2. Coreg 3.125 mg b.i.d. 3. Ferrous gluconate 324 mg b.i.d. 4. Lasix 40 mg daily x3 more days. 5. Potassium 20 mEq daily x3 more days. 6. Ramipril 5 mg daily. 7. Coumadin 5 mg daily or as directed. 8. Aspirin 81 mg daily. 9. Multivitamin daily. 10.Percocet 5/325 one to two q.4 h. p.r.n. pain.  He will follow  up in the next 48 hours with the [facility] for management of his anticoagulation.  Other discharge followup is unchanged.     Coral Ceo, P.A.   ______________________________ Evelene Croon, M.D.    GC/MEDQ  D:  10/30/2010  T:  10/30/2010  Job:  [id number]  cc:   [facility] Office  Electronically Signed by Coral Ceo P.A. on 11/04/2010 01:20:48 PM Electronically Signed by Evelene Croon M.D. on 11/10/2010 04:28:20 PM

## 2010-11-14 ENCOUNTER — Ambulatory Visit (INDEPENDENT_AMBULATORY_CARE_PROVIDER_SITE_OTHER): Payer: Medicare Other | Admitting: General Surgery

## 2010-11-14 ENCOUNTER — Encounter (INDEPENDENT_AMBULATORY_CARE_PROVIDER_SITE_OTHER): Payer: Self-pay | Admitting: General Surgery

## 2010-11-14 ENCOUNTER — Ambulatory Visit (INDEPENDENT_AMBULATORY_CARE_PROVIDER_SITE_OTHER): Payer: Self-pay | Admitting: Internal Medicine

## 2010-11-14 VITALS — BP 126/86 | HR 64 | Temp 97.9°F | Resp 16 | Ht 71.0 in | Wt 162.8 lb

## 2010-11-14 DIAGNOSIS — Z09 Encounter for follow-up examination after completed treatment for conditions other than malignant neoplasm: Secondary | ICD-10-CM

## 2010-11-14 LAB — BASIC METABOLIC PANEL
BUN: 12
Calcium: 9
Creatinine, Ser: 0.87
GFR calc Af Amer: >60
GFR calc non Af Amer: >60

## 2010-11-14 LAB — TYPE AND SCREEN: Antibody Screen: POSITIVE

## 2010-11-14 LAB — CBC
Platelets: 126 — ABNORMAL LOW
RBC: 3.43 — ABNORMAL LOW
WBC: 8

## 2010-11-14 LAB — POCT INR: INR: 2.6

## 2010-11-14 NOTE — Progress Notes (Signed)
Subjective:     Patient ID: TYSHUN TUCKERMAN, male   DOB: 1926-06-13, 77 y.o.   MRN: [id number]  HPI  This is an 77 year old male who I did a right inguinal hernia repair several weeks ago for a very symptomatic right groin hernia. He did very well from that except for the fact that he had exacerbation of his pre-existing aortic valvular disease. He subsequently has now undergone a redo sternotomy as well as an aortic valve replacement by Dr. Laneta Simmers. He is doing well overall her returns today for a postoperative visit. I'd seen him in the hospital previously as well. His right groin doesn't bother him on at all at this point. Review of Systems     Objective:   Physical Exam Well healed right groin incision without infection, no hernia    Assessment:     S/p RIH repair    Plan:        He is doing fine after his hernia repair. He has she did real well after his aortic valve replacement also. I told him I will release him to full activity and will just be based mostly on a sternotomy at this point. He is going to come back and see me as needed.

## 2010-11-15 ENCOUNTER — Other Ambulatory Visit: Payer: Self-pay | Admitting: Surgery

## 2010-11-15 DIAGNOSIS — I359 Nonrheumatic aortic valve disorder, unspecified: Secondary | ICD-10-CM

## 2010-11-16 ENCOUNTER — Encounter: Payer: Self-pay | Admitting: Cardiology

## 2010-11-16 ENCOUNTER — Ambulatory Visit (INDEPENDENT_AMBULATORY_CARE_PROVIDER_SITE_OTHER): Payer: Medicare Other | Admitting: Cardiology

## 2010-11-16 DIAGNOSIS — Z952 Presence of prosthetic heart valve: Secondary | ICD-10-CM

## 2010-11-16 DIAGNOSIS — I350 Nonrheumatic aortic (valve) stenosis: Secondary | ICD-10-CM

## 2010-11-16 DIAGNOSIS — E785 Hyperlipidemia, unspecified: Secondary | ICD-10-CM | POA: Insufficient documentation

## 2010-11-16 DIAGNOSIS — I2510 Atherosclerotic heart disease of native coronary artery without angina pectoris: Secondary | ICD-10-CM | POA: Insufficient documentation

## 2010-11-16 DIAGNOSIS — I359 Nonrheumatic aortic valve disorder, unspecified: Secondary | ICD-10-CM

## 2010-11-16 DIAGNOSIS — E7800 Pure hypercholesterolemia, unspecified: Secondary | ICD-10-CM

## 2010-11-16 DIAGNOSIS — I420 Dilated cardiomyopathy: Secondary | ICD-10-CM

## 2010-11-16 DIAGNOSIS — I4891 Unspecified atrial fibrillation: Secondary | ICD-10-CM

## 2010-11-16 DIAGNOSIS — I10 Essential (primary) hypertension: Secondary | ICD-10-CM

## 2010-11-16 LAB — TYPE AND SCREEN: ABO/RH(D): O POS

## 2010-11-16 LAB — BLOOD GAS, ARTERIAL
Acid-Base Excess: 0.9
Drawn by: 206361
FIO2: 0.21
pCO2 arterial: 39.9
pH, Arterial: 7.413
pO2, Arterial: 88.2

## 2010-11-16 LAB — COMPREHENSIVE METABOLIC PANEL
ALT: 18
AST: 24
Albumin: 3.7
Calcium: 9.6
Chloride: 107
Creatinine, Ser: 0.88
GFR calc Af Amer: >60
Sodium: 136
Total Bilirubin: 1.2

## 2010-11-16 LAB — URINALYSIS, ROUTINE W REFLEX MICROSCOPIC
Glucose, UA: NEGATIVE
Nitrite: POSITIVE — AB
Specific Gravity, Urine: 1.011
pH: 6.5

## 2010-11-16 LAB — CBC
MCV: 99.8
Platelets: 146 — ABNORMAL LOW
WBC: 3.9 — ABNORMAL LOW

## 2010-11-16 LAB — APTT: aPTT: 31

## 2010-11-16 LAB — URINE MICROSCOPIC-ADD ON

## 2010-11-16 MED ORDER — CARVEDILOL 6.25 MG PO TABS: 6.2500 mg | ORAL_TABLET | Freq: Two times a day (BID) | ORAL | Status: DC

## 2010-11-16 MED ORDER — ATORVASTATIN CALCIUM 10 MG PO TABS: 10.0000 mg | ORAL_TABLET | Freq: Every day | ORAL | Status: DC

## 2010-11-16 NOTE — Patient Instructions (Signed)
Reduce your amiodarone to 200 mg daily- 1 tablet.  Increase carvedilol to 6.25 mg twice a day.  We will start you back on Lipitor 10 mg daily.  We will schedule you for an elective cardioversion the week of October 22nd provided your coumadin is OK.  We will check lab work prior to your cardioversion. If your hemoglobin has improved we may be able to stop your iron.

## 2010-11-16 NOTE — Assessment & Plan Note (Signed)
I have recommended resumption of Lipitor 10 mg daily for his hypercholesterolemia. He had taken this previously and it is unclear why it was stopped.

## 2010-11-16 NOTE — Assessment & Plan Note (Signed)
Recent cardiac catheterization demonstrated continued patency of his grafts. We will focus on risk factor modification.

## 2010-11-16 NOTE — Progress Notes (Signed)
[name] Date of Birth: April 15, 1926   History of Present Illness: [name] is seen for followup today. He was recently hospitalized with congestive heart failure and new onset of atrial fibrillation. Echocardiogram showed severe aortic stenosis with decrease in ejection fraction of 25%. He underwent cardiac catheterization which demonstrated continued long-term patency of his bypass grafts. He was loaded with amiodarone. He underwent aortic valve replacement with a #25 magna Ease pericardial valve by Dr. Laneta Simmers. His postoperative course was remarkable for persistent atrial fibrillation. On followup today he complains that he still feels poorly. He denies any significant pain. He does complain of a lack of energy and overall fatigue. His appetite has been poor. He still has some shortness of breath. He did have a followup chest x-ray post discharge which showed marked improvement in his lung aeration and resolution of the right lower lobe infiltrate. He initially had some swelling in his feet and ankles but this has resolved. He has a history of coronary artery disease and underwent coronary bypass surgery in 2000. This included an LIMA graft to the LAD and a sequential saphenous vein graft to the first diagonal and first obtuse marginal vessels. He also had a saphenous vein graft to the second obtuse marginal vessel and a saphenous vein graft to the PDA.  Current Outpatient Prescriptions on File Prior to Visit  Medication Sig Dispense Refill  . amiodarone (PACERONE) 200 MG tablet Take 200 mg by mouth 2 (two) times daily.        [name] aspirin 81 MG tablet Take 81 mg by mouth daily.        . ferrous gluconate (FERGON) 324 MG tablet Take 324 mg by mouth 2 (two) times daily.       . Multiple Vitamin (MULTIVITAMIN) tablet Take 1 tablet by mouth daily.        [name] oxyCODONE-acetaminophen (PERCOCET) 5-325 MG per tablet Take 1 tablet by mouth every 4 (four) hours as needed.        . ramipril (ALTACE) 5  MG capsule Take 5 mg by mouth daily.        [name] warfarin (COUMADIN) 5 MG tablet Take 5 mg by mouth daily. Or as directed       . DISCONTD: carvedilol (COREG) 6.25 MG tablet Take 3.125 mg by mouth 2 (two) times daily with a meal.         No Known Allergies  Past Medical History  Diagnosis Date  . Atrial fibrillation   . Hypertension   . AAA (abdominal aortic aneurysm)   . History of carotid stenosis   . Gout   . Arthritis   . Hyperlipidemia   . Stroke 2006    three strokes   . Wears glasses   . Coronary artery disease   . Aortic stenosis   . PVC's (premature ventricular contractions)   . PAT (paroxysmal atrial tachycardia)   . Inguinal hernia     RIH  . Emphysema of lung     Past Surgical History  Procedure Date  . Tonsillectomy   . Joint replacement     Right TKR  . Abdominal aortic aneurysm repair 2008    Gore Excluder Stent Graft repair  . Carotid endarterectomy 2006    Left Side  . Hemorrhoid surgery   . Heart surgery  1970    bypass and open heart surgery  . Replacement total knee 2008  . Abdominal aortic aneurysm repair 2010/2012  . Colon surgery  lap right colon  . Cardiac catheterization 04/29/1998    EF 60%  . Coronary artery bypass graft 2000    5 vessel BY DR.BARTEL. LIMA GRAFT TO THE LAD, SEQUENTIAL SAPHENOUS VEIN GRAFT TO THE  FIRST DIAGONAL AND FIRST OBTUSE MARGINAL VESSELS, SAPHENOUS VEIN GRAFT TO THE SECOND OBTUSE MARGINAL VESSEL, AND SAPHENOUS VEIN GRAFT TO THE PDA  . US echocardiography 08/18/2009    EF 55-60%  . US echocardiography 04/22/2007    EF 55-60%  . Cardiovascular stress test 08/19/2009  . Open heart surgery 10/24/10    AVR  #25 mm Magna Ease pericardial valve  . Hernia repair 10/13/10    RIH    History  Smoking status  . Former Smoker  . Types: Cigars  Smokeless tobacco  . Never Used    History  Alcohol Use  . 1.8 oz/week  . 3 Cans of beer per week    Family History  Problem Relation Age of Onset  . Other Mother      falopian tube during pregnancy   . Heart disease Father   . Cancer Brother     liver     Review of Systems: As noted in history of present illness.  He has had no dizziness or syncope. He's had no bleeding problems. He does have a dry scaly rash on his feet. All other systems were reviewed and are negative.  Physical Exam: BP 142/70  Pulse 80  Ht 5\' 11"  (1.803 m)  Wt 164 lb (74.39 kg)  BMI 22.87 kg/m2 He is an elderly white male in no acute distress. He is normocephalic, atraumatic. Pupils are equal round and reactive to light and accommodation. Extraocular movements are full. He has no jugular venous distention or bruits. He has an old left carotid endarterectomy scar. There is no adenopathy or thyromegaly. Lungs are clear. Cardiac exam reveals a irregular rate and rhythm with normal aortic valve sound. There are no significant murmurs or gallops. Abdomen is soft and nontender without masses. He does have a soft midline bruit. Extremities are without edema. He has palpable pedal pulses. He does have a scaly rash involving his toes and the dorsum of his foot. He is alert and oriented x3. Cranial nerves II through XII are intact. LABORATORY DATA: ECG shows atrial fibrillation with a rate of 70 beats per minute. He has a left bundle branch block. There is left axis deviation.  Assessment / Plan:

## 2010-11-16 NOTE — Assessment & Plan Note (Addendum)
He has persistent atrial fibrillation with a controlled ventricular response. We will schedule him for elective cardioversion now that he has been loaded with amiodarone. His INR was subtherapeutic on September 24 we will need to wait until at least October 22 to schedule this. We will check chemistries, CBC, and TSH prior to his procedure. He is unable to maintain sinus rhythm on amiodarone then we may out to treat him long-term with rate control and anticoagulation. Hopefully we can restore sinus rhythm. We will reduce his amiodarone to 200 mg daily.

## 2010-11-16 NOTE — Assessment & Plan Note (Signed)
Recent ejection fraction was 25%. Hopefully this was related to his critical aortic stenosis and will improve with valve replacement. We will increase his carvedilol to 6.25 mg twice daily. He will continue on his ACE inhibitor. He does not appear to be volume overloaded at this point so we will continue to hold diuretics. We will reassess an echocardiogram once he has reestablished sinus rhythm.

## 2010-11-16 NOTE — Assessment & Plan Note (Signed)
He is status post aortic valve replacement with a pericardial valve for severe aortic stenosis. His valve sounds are normal today. His postoperative recovery is going well. I think that his lack of appetite and fatigue are still related to his surgery and will improve with time. We will plan on a followup echocardiogram once we have reestablished sinus rhythm and optimize his medical therapy.

## 2010-11-21 ENCOUNTER — Ambulatory Visit (INDEPENDENT_AMBULATORY_CARE_PROVIDER_SITE_OTHER): Payer: Self-pay | Admitting: Physician Assistant

## 2010-11-21 ENCOUNTER — Ambulatory Visit
Admission: RE | Admit: 2010-11-21 | Discharge: 2010-11-21 | Disposition: A | Discharge status: Home / Self Care | Payer: Medicare Other | Source: Ambulatory Visit | Attending: Surgery | Admitting: Surgery

## 2010-11-21 VITALS — BP 128/68 | HR 66 | Resp 18 | Ht 71.0 in | Wt 164.0 lb

## 2010-11-21 DIAGNOSIS — I350 Nonrheumatic aortic (valve) stenosis: Secondary | ICD-10-CM

## 2010-11-21 DIAGNOSIS — I359 Nonrheumatic aortic valve disorder, unspecified: Secondary | ICD-10-CM

## 2010-11-21 DIAGNOSIS — Z952 Presence of prosthetic heart valve: Secondary | ICD-10-CM

## 2010-11-21 DIAGNOSIS — Z954 Presence of other heart-valve replacement: Secondary | ICD-10-CM

## 2010-11-21 NOTE — Progress Notes (Signed)
PCP is Aida Puffer, MD Referring Provider is [place], Peter, MD  Chief Complaint  Patient presents with  . Routine Post Op    3 week f/u from surgery with CXR, S/P Redo median sternotomy, aortic valve replacement using a 25-mm Edwards pericardial Magna-Ease valve    HPI: [name] is S/P redo sternotomy for AVR (25mm Magna-Ease tissue valve) on 10/24/2010 by Dr. Laneta Simmers.  His post-op course was notable for atrial fibrillation, which was never able to convert to sinus rhythm.  He was discharged home in rate controlled AF on Amiodarone and Coreg.  He has since seen Dr. [place] in follow up, and is scheduled for a cardioversion on 12/05/2010.  His Amio dose has been decreased, and his Coreg dose has been titrated upward.    He is overall progressing well.  He continues to be mildly SOB, but denies cough, chest pain, edema, orthopnea or PND.  He is walking daily and his appetite is improving.  He requests a refill on pain meds today.  Past Medical History  Diagnosis Date  . Atrial fibrillation   . Hypertension   . AAA (abdominal aortic aneurysm)   . History of carotid stenosis   . Gout   . Arthritis   . Hyperlipidemia   . Stroke 2006    three strokes   . Wears glasses   . Coronary artery disease   . Aortic stenosis   . PVC's (premature ventricular contractions)   . PAT (paroxysmal atrial tachycardia)   . Inguinal hernia     RIH  . Emphysema of lung     Past Surgical History  Procedure Date  . Tonsillectomy   . Joint replacement     Right TKR  . Abdominal aortic aneurysm repair 2008    Gore Excluder Stent Graft repair  . Carotid endarterectomy 2006    Left Side  . Hemorrhoid surgery   . Heart surgery  1970    bypass and open heart surgery  . Replacement total knee 2008  . Abdominal aortic aneurysm repair 2010/2012  . Colon surgery     lap right colon  . Cardiac catheterization 04/29/1998    EF 60%  . Coronary artery bypass graft 2000    5 vessel BY DR.BARTEL. LIMA GRAFT  TO THE LAD, SEQUENTIAL SAPHENOUS VEIN GRAFT TO THE  FIRST DIAGONAL AND FIRST OBTUSE MARGINAL VESSELS, SAPHENOUS VEIN GRAFT TO THE SECOND OBTUSE MARGINAL VESSEL, AND SAPHENOUS VEIN GRAFT TO THE PDA  . US echocardiography 08/18/2009    EF 55-60%  . US echocardiography 04/22/2007    EF 55-60%  . Cardiovascular stress test 08/19/2009  . Open heart surgery 10/24/10    AVR  #25 mm Magna Ease pericardial valve  . Hernia repair 10/13/10    RIH    Family History  Problem Relation Age of Onset  . Other Mother     falopian tube during pregnancy   . Heart disease Father   . Cancer Brother     liver     Social History History  Substance Use Topics  . Smoking status: Former Smoker    Types: Cigars  . Smokeless tobacco: Never Used  . Alcohol Use: 1.8 oz/week    3 Cans of beer per week    Current Outpatient Prescriptions  Medication Sig Dispense Refill  . amiodarone (PACERONE) 200 MG tablet Take 200 mg by mouth daily.       [name] aspirin 81 MG tablet Take 81 mg by mouth daily.        [name]  atorvastatin (LIPITOR) 10 MG tablet Take 1 tablet (10 mg total) by mouth daily.  30 tablet  11  . carvedilol (COREG) 6.25 MG tablet Take 12.5 mg by mouth 2 (two) times daily with a meal.        . ferrous gluconate (FERGON) 324 MG tablet Take 324 mg by mouth 2 (two) times daily.       . Multiple Vitamin (MULTIVITAMIN) tablet Take 1 tablet by mouth daily.        . ramipril (ALTACE) 5 MG capsule Take 5 mg by mouth daily.        [name] warfarin (COUMADIN) 5 MG tablet Take 5 mg by mouth daily. Or as directed Monitored by Dr [place]      . DISCONTD: carvedilol (COREG) 6.25 MG tablet Take 1 tablet (6.25 mg total) by mouth 2 (two) times daily with a meal.  60 tablet  11  . oxyCODONE-acetaminophen (PERCOCET) 5-325 MG per tablet Take 1 tablet by mouth every 4 (four) hours as needed.          No Known Allergies  Review of Systems: See HPI for pertinent positives and negatives  BP 128/68  Pulse 66  Resp 18  Ht 5\' 11"  (1.803 m)   Wt 164 lb (74.39 kg)  BMI 22.87 kg/m2  SpO2 98% Physical Exam: Sternotomy incision is healing well.  Sternum is stable to palpation.  Heart is irregularly irregular, no murmurs, rubs or gallops. Lungs clear to auscultation.  No lower extremity edema.    Diagnostic Tests: CXR shows stable ASD, no pneumothorax or effusions  Impression: [name] is doing well overall S/P redo sternotomy/AVR.    Plan: He may begin to increase his activity as tolerated and may begin driving short distances.  He is given a refill on Percocet 5/325 1-2 q 4 hrs. Prn pain.  He has been contacted by outpatient cardiac rehab, and is encouraged to proceed once cleared by cardiology.  He will return to see Dr. Laneta Simmers after his cardioversion, in about 1 month, and may call in the interim with any problems or questions.

## 2010-11-21 NOTE — Patient Instructions (Signed)
May drive if not taking pain meds May begin cardiac rehab Follow up in 1 month with Dr. Laneta Simmers

## 2010-11-23 ENCOUNTER — Ambulatory Visit (INDEPENDENT_AMBULATORY_CARE_PROVIDER_SITE_OTHER): Payer: Medicare Other | Admitting: *Deleted

## 2010-11-23 DIAGNOSIS — Z952 Presence of prosthetic heart valve: Secondary | ICD-10-CM

## 2010-11-23 DIAGNOSIS — I359 Nonrheumatic aortic valve disorder, unspecified: Secondary | ICD-10-CM

## 2010-11-28 ENCOUNTER — Inpatient Hospital Stay (HOSPITAL_COMMUNITY)
Admission: EM | Admit: 2010-11-28 | Discharge: 2010-11-30 | DRG: 292 | Disposition: A | Discharge status: Home / Self Care | Payer: Medicare Other | Source: Ambulatory Visit | Attending: Cardiology | Admitting: Cardiology

## 2010-11-28 ENCOUNTER — Inpatient Hospital Stay (HOSPITAL_COMMUNITY): Payer: Medicare Other

## 2010-11-28 ENCOUNTER — Emergency Department (HOSPITAL_COMMUNITY): Payer: Medicare Other

## 2010-11-28 DIAGNOSIS — Z8249 Family history of ischemic heart disease and other diseases of the circulatory system: Secondary | ICD-10-CM

## 2010-11-28 DIAGNOSIS — I739 Peripheral vascular disease, unspecified: Secondary | ICD-10-CM | POA: Diagnosis present

## 2010-11-28 DIAGNOSIS — I2510 Atherosclerotic heart disease of native coronary artery without angina pectoris: Secondary | ICD-10-CM | POA: Diagnosis present

## 2010-11-28 DIAGNOSIS — D649 Anemia, unspecified: Secondary | ICD-10-CM | POA: Diagnosis present

## 2010-11-28 DIAGNOSIS — M109 Gout, unspecified: Secondary | ICD-10-CM | POA: Diagnosis present

## 2010-11-28 DIAGNOSIS — Z951 Presence of aortocoronary bypass graft: Secondary | ICD-10-CM

## 2010-11-28 DIAGNOSIS — I10 Essential (primary) hypertension: Secondary | ICD-10-CM | POA: Diagnosis present

## 2010-11-28 DIAGNOSIS — Z952 Presence of prosthetic heart valve: Secondary | ICD-10-CM

## 2010-11-28 DIAGNOSIS — Z860100 Personal history of colon polyps, unspecified: Secondary | ICD-10-CM

## 2010-11-28 DIAGNOSIS — R0602 Shortness of breath: Secondary | ICD-10-CM

## 2010-11-28 DIAGNOSIS — Z7901 Long term (current) use of anticoagulants: Secondary | ICD-10-CM

## 2010-11-28 DIAGNOSIS — Z8673 Personal history of transient ischemic attack (TIA), and cerebral infarction without residual deficits: Secondary | ICD-10-CM

## 2010-11-28 DIAGNOSIS — I5023 Acute on chronic systolic (congestive) heart failure: Principal | ICD-10-CM | POA: Diagnosis present

## 2010-11-28 DIAGNOSIS — I509 Heart failure, unspecified: Secondary | ICD-10-CM | POA: Diagnosis present

## 2010-11-28 DIAGNOSIS — I428 Other cardiomyopathies: Secondary | ICD-10-CM | POA: Diagnosis present

## 2010-11-28 DIAGNOSIS — F17200 Nicotine dependence, unspecified, uncomplicated: Secondary | ICD-10-CM | POA: Diagnosis present

## 2010-11-28 DIAGNOSIS — Z96659 Presence of unspecified artificial knee joint: Secondary | ICD-10-CM

## 2010-11-28 DIAGNOSIS — Z79899 Other long term (current) drug therapy: Secondary | ICD-10-CM

## 2010-11-28 DIAGNOSIS — Z7982 Long term (current) use of aspirin: Secondary | ICD-10-CM

## 2010-11-28 DIAGNOSIS — I4891 Unspecified atrial fibrillation: Secondary | ICD-10-CM | POA: Diagnosis present

## 2010-11-28 LAB — URINE MICROSCOPIC-ADD ON

## 2010-11-28 LAB — BASIC METABOLIC PANEL
BUN: 16
CO2: 30
CO2: 30
Calcium: 8.6
Calcium: 8.8
Calcium: 8.9
Chloride: 101
Creatinine, Ser: 0.87
Creatinine, Ser: 0.9
GFR calc Af Amer: >60
GFR calc Af Amer: >60
GFR calc non Af Amer: >60
Glucose, Bld: 108 — ABNORMAL HIGH
Glucose, Bld: 136 — ABNORMAL HIGH
Potassium: 4.8

## 2010-11-28 LAB — DIFFERENTIAL
Eosinophils Absolute: 0.1
Eosinophils Relative: 3 % (ref 0–5)
Lymphocytes Relative: 13 % (ref 12–46)
Lymphs Abs: 0.9 10*3/uL (ref 0.7–4.0)
Lymphs Abs: 1.1
Monocytes Absolute: 0.7 10*3/uL (ref 0.1–1.0)
Monocytes Relative: 10 % (ref 3–12)
Monocytes Relative: 9
Neutrophils Relative %: 65

## 2010-11-28 LAB — CBC
HCT: 32.3 % — ABNORMAL LOW (ref 39.0–52.0)
Hemoglobin: 13.1
Hemoglobin: 9.7 — ABNORMAL LOW
MCHC: 34.7 g/dL (ref 30.0–36.0)
MCHC: 34.6
MCHC: 35.3
MCV: 96.1 fL (ref 78.0–100.0)
Platelets: 128 — ABNORMAL LOW
RBC: 2.82 — ABNORMAL LOW
RDW: 14.8 % (ref 11.5–15.5)
RDW: 13.9
RDW: 14
RDW: 14.1 — ABNORMAL HIGH
WBC: 6.9 10*3/uL (ref 4.0–10.5)
WBC: 6.2
WBC: 6.3

## 2010-11-28 LAB — COMPREHENSIVE METABOLIC PANEL
ALT: 31 U/L (ref 0–53)
ALT: 32
AST: 28 U/L (ref 0–37)
CO2: 23 mEq/L (ref 19–32)
Calcium: 9.8
Chloride: 99 mEq/L (ref 96–112)
GFR calc Af Amer: >60
GFR calc non Af Amer: 81 mL/min — ABNORMAL LOW (ref >90)
Glucose, Bld: 93 mg/dL (ref 70–99)
Glucose, Bld: 105 — ABNORMAL HIGH
Sodium: 133 mEq/L — ABNORMAL LOW (ref 135–145)
Sodium: 136
Total Bilirubin: 0.6 mg/dL (ref 0.3–1.2)
Total Protein: 6.5

## 2010-11-28 LAB — URINALYSIS, ROUTINE W REFLEX MICROSCOPIC
Glucose, UA: NEGATIVE
Hgb urine dipstick: NEGATIVE
Ketones, ur: NEGATIVE
Protein, ur: 30 — AB
Specific Gravity, Urine: 1.015
Urobilinogen, UA: 0.2
pH: 7

## 2010-11-28 LAB — HEMOGLOBIN AND HEMATOCRIT, BLOOD: HCT: 30.7 — ABNORMAL LOW

## 2010-11-28 LAB — APTT: aPTT: 29

## 2010-11-28 LAB — URINE CULTURE
Colony Count: 10000
Special Requests: NEGATIVE

## 2010-11-28 LAB — CROSSMATCH
ABO/RH(D): O POS
Antibody Screen: POSITIVE

## 2010-11-28 LAB — PROTIME-INR: INR: 1

## 2010-11-28 LAB — PRO B NATRIURETIC PEPTIDE: Pro B Natriuretic peptide (BNP): 8644 pg/mL — ABNORMAL HIGH (ref 0–450)

## 2010-11-28 LAB — TSH: TSH: 5.882 u[IU]/mL — ABNORMAL HIGH (ref 0.350–4.500)

## 2010-11-29 DIAGNOSIS — I059 Rheumatic mitral valve disease, unspecified: Secondary | ICD-10-CM

## 2010-11-29 DIAGNOSIS — I5021 Acute systolic (congestive) heart failure: Secondary | ICD-10-CM

## 2010-11-29 LAB — BASIC METABOLIC PANEL
Calcium: 9.3 mg/dL (ref 8.4–10.5)
Chloride: 100 mEq/L (ref 96–112)
Creatinine, Ser: 0.94 mg/dL (ref 0.50–1.35)
GFR calc Af Amer: 88 mL/min — ABNORMAL LOW (ref >90)
GFR calc non Af Amer: 76 mL/min — ABNORMAL LOW (ref >90)

## 2010-11-29 LAB — PROTIME-INR
INR: 3.15 — ABNORMAL HIGH (ref 0.00–1.49)
Prothrombin Time: 32.8 seconds — ABNORMAL HIGH (ref 11.6–15.2)

## 2010-11-30 ENCOUNTER — Encounter: Payer: Medicare Other | Admitting: *Deleted

## 2010-11-30 ENCOUNTER — Other Ambulatory Visit: Payer: Medicare Other | Admitting: *Deleted

## 2010-11-30 ENCOUNTER — Other Ambulatory Visit: Payer: Self-pay | Admitting: *Deleted

## 2010-11-30 DIAGNOSIS — I5023 Acute on chronic systolic (congestive) heart failure: Secondary | ICD-10-CM

## 2010-11-30 DIAGNOSIS — I4891 Unspecified atrial fibrillation: Secondary | ICD-10-CM

## 2010-11-30 LAB — PROTIME-INR: Prothrombin Time: 29.9 seconds — ABNORMAL HIGH (ref 11.6–15.2)

## 2010-12-02 NOTE — Discharge Summary (Addendum)
NAMEMarland Kitchen  [name], [name] NO.:  [id number]  MEDICAL RECORD NO.:  [id number]  LOCATION:  4714                         FACILITY:  [facility]  PHYSICIAN:  Eldred Lievanos M. [place], M.D.  DATE OF BIRTH:  08/13/1926  DATE OF ADMISSION:  11/28/2010 DATE OF DISCHARGE:  11/30/2010                              DISCHARGE SUMMARY   PRIMARY CARE PHYSICIAN:  Dr. Clarene Duke.  DISCHARGE DIAGNOSES: 1. Acute on chronic systolic heart failure.     a.     Cardiomyopathy with previous EF of 25%-30% in September      2012, improved to 45%-50% by echo on November 29, 2010.     b.     Discharge weight 72.2 kg. 2. Critical aortic stenosis, status post aortic valve replacement with     Edwards Pericardial Magna Ease valve in September 2012. 3. Hypertension. 4. Coronary artery disease, status post CABG x5 in 2000, abdominal     aortic aneurysm, status post stent graft in September 2009. 5. Gout. 6. PVD with left internal carotid artery stenosis, status post left     carotid endarterectomy in September 2006. 7. History of right colon mass (tubulovillous adenoma), status post     lap-assisted right hemicolectomy in July 2011. 8. Attempted embolization, occlusion of vessels for type 2 endoleak     around the abdominal stent graft. 9. Tobacco abuse. 10.Postop atrial fibrillation, for possible cardioversion is scheduled     next week pending therapeutic INR. 11.Anemia.  HOSPITAL COURSE:  [name] is an 77 year old gentleman with a history of CAD, status post CABG, cardiomyopathy with EF of 25%-30%, critical aortic stenosis who recently underwent valve replacement surgery in September who presented to [facility] with complaints of shortness of breath.  He has had some dyspnea on exertion.  No orthopnea, as well as weight gain of 6 pounds and lower extremity edema. He was given IV Lasix in the ER and symptomatically improved.  He was felt to have syndrome consistent with congestive heart failure  and was admitted to the hospital for IV diuresis.  The patient responded very well and quickly to diuretics and yesterday was able to be changed to p.o. Lasix.  On day of discharge, he is feeling better without any shortness of breath, orthopnea, or PND.  He has been ambulating without difficulty.  He initially had cardioversion scheduled for December 05, 2010, however, he did have a subtherapeutic INR on September 24. Therefore, Dr. [place]and would like him to get an INR on Monday of next week with plans to reassess and schedule cardioversion later in the week.  Dr. [place]and has seen and examined the patient today and feels he is stable for discharge.  DISCHARGE LABS:  WBC 8.9, hemoglobin 11.2, hematocrit 32.3, platelet count 181.  INR 2.79.  Patient's INR was supratherapeutic on admission at 3.81, however Dr. [place]and would like to keep him on his home dose of Coumadin for now as he feels that his INR was possibly elevated because of the patient's heart failure.  Sodium 139, potassium 3.3, chloride 100, CO2 of 28, glucose 145, BUN 18, creatinine 0.94.  TSH was 5.82. First set of cardiac markers show elevated MB of  4.4 but negative troponin.  BNP was 844 on admission.  Studies: 1. Chest x-ray on November 28, 2010, showed marked interval improvement     for interstitial edema, ever since has resolved.  Trace right     pleural effusion is suspected. 2. 2D echocardiogram on November 28, 2010, demonstrated EF of 45%-50%.     Mild LVH.  Very mild aortic stenosis.  Trivial regurgitation.  Mild     MR.  DISCHARGE MEDICATIONS: 1. Lasix 40 mg daily. 2. Potassium chloride 20 mEq daily. 3. Zocor 20 mg at bedtime. 4. Aspirin 81 mg daily. 5. Amiodarone 200 mg daily. 6. Coreg 6.25 mg b.i.d. 7. Ferrous gluconate 324 mg b.i.d. 8. Multivitamin 1 tablet every morning. 9. Oxycodone/acetaminophen 5/325 mg 1-2 tablets q.4 hours p.r.n. pain. 10.Ramipril 5 mg daily. 11.Coumadin 5 mg half tablet Monday,  Wednesday, Friday, and one tablet     rest of the week at bedtime.  DISPOSITION:  [name] will be discharged in stable condition to home. He is to increase activity slowly and to follow a low-sodium heart healthy diet.  He is also to follow up with his PCP regarding recheck of his thyroid function which was mildly abnormal as well as the retest/follow his blood sugar which was also elevated at 145 today.  He will have an INR checked on Monday on October 22, at 10:45 a.m.  If this is therapeutic, Dr. [place]and will proceed with scheduling for cardioversion and also a BMET and CBC prior to that date.  I have made our office aware of these followup plans that are based on the INR, and they will be triaged to Dr. [place]and.  DURATION OF DISCHARGE ENCOUNTER:  Greater than 30 minutes including physician and PA time.     Ronie Spies, P.A.C.   ______________________________ Dwon Sky M. [place], M.D.    DD/MEDQ  D:  11/30/2010  T:  11/30/2010  Job:  [id number]  cc:   Dr. Clarene Duke  Electronically Signed by Ronie Spies  on 12/02/2010 09:03:25 PM Electronically Signed by Ameera Tigue [place] M.D. on 12/05/2010 02:56:42 PM

## 2010-12-04 NOTE — H&P (Signed)
[name], [name] NO.:  [id number]  MEDICAL RECORD NO.:  [id number]  LOCATION:  4714                         FACILITY:  [facility]  PHYSICIAN:  Patrick Sans. Laquinton Bihm, MD, FACCDATE OF BIRTH:  1927/01/26  DATE OF ADMISSION:  11/28/2010 DATE OF DISCHARGE:                             HISTORY & PHYSICAL   PRIMARY CARDIOLOGIST:  Dr. Peter Griffith.  PRIMARY MD:  Dr. Clarene Griffith.  CHIEF COMPLAINT:  Shortness of breath.  HISTORY OF PRESENT ILLNESS:  [name] is an 77 year old married white male who has a history of coronary artery disease  status post coronary bypass grafting in 2000, history of chronic systolic heart failure, history of severe aortic stenosis, status post Edwards pericardial valve on October 24, 2010, chronic atrial fibrillation, hypertension, who presents to the emergency room with chief complaint of shortness of breath.  This started getting worse Wednesday or Thursday of last week which has been 5 days ago.  However his wife states that he has had dyspnea on exertion and some orthopnea since the valve replacement.  He is not on a diuretic.  He was sent home with 3 days of diuretics after surgery.   He denies any chest pain or angina.  He clearly has had worsening orthopnea and his weight is up 6 pounds.  Specifically, he has gone from 160 to 166 over the past week.  His lower extremity edema has also been worse right greater than left. His previous vein graft harvesting out of his right leg.  He already reports breathing better since receiving IV Lasix in the emergency room.  PAST MEDICAL HISTORY:  Significant for coronary artery disease as mentioned above.  He also had an aortic aneurysm status post stent grafting in September 2009.  He also has severe aortic stenosis status post valve replacement on October 24, 2010.  Status post carotid artery endarterectomy in 2006 and status post hemicolectomy in July 2011 for right colonic masses  which was a tubulovillous adenoma.  He also has a history of hypertension, gout, chronic AFib and peripheral vascular disease.  MEDICATIONS:  Prior to admission were ramipril 5 mg a day.  Amiodarone 200 mg b.i.d., Coumadin as directed.  Carvedilol 6.25 b.i.d., ferrous gluconate 324 mg b.i.d., oxycodone APAP 5/325 1-2 q.4 h. p.r.n. aspirin 81 mg a day, Lipitor 10 mg per day.  He received 40 mg of IV Lasix in the emergency room as well as albuterol and Atrovent inhalers.  ALLERGIES:  He has no known drug allergies.  SOCIAL HISTORY:  He lives in [place] with his wife.  He is married and has 7 children.  He quit smoking in October 16, 2010.  He is to drink 2- 3 beers per day.  He walks daily.  FAMILY HISTORY:  Significant for coronary artery disease and hypertension.  REVIEW OF SYSTEMS:  He denies any nausea, vomiting, sweats, fever chills, hemoptysis, melena, or hematochezia.  All other review of systems are negative.  Please note again, he has been orthopneic since his surgery.  PHYSICAL EXAMINATION:  VITAL SIGNS:  Blood pressure is 144/90, pulse is 80 and irregularly irregular, respirations 19, temp 98.3, sats 90% on room air. GENERAL:  He  is in no acute distress.  He is breathing better. HEENT:  Normocephalic.  Atraumatic.  PERRLA extraocular movements are intact.  Sclerae are clear. NECK:  Supple.  There is significant JVD.  No obvious carotid bruit with good upstroke.  No thyromegaly and no obvious lymphadenopathy. CARDIOVASCULAR: PMI is displaced inferolaterally.  Irregularly irregular heart rate and rhythm with a variable S1 and S2.  Soft systolic murmur along the left sternal border.  There is no obvious gallop.  LUNGS: Clear to auscultation except for bibasilar crackles. SKIN:  Few ecchymoses. ABDOMEN:  Soft, good bowel sounds.  No hepatomegaly and no tenderness. EXTREMITIES:  Reveal 2+ edema on the right, trace on the left.  There is no sign of DVT. MUSCULOSKELETAL:   Chronic arthritic changes. NEUROLOGICAL:  Alert and oriented x3.  Affect is normal.  LABORATORY DATA:  Chest x-ray shows bilateral perihilar airspace disease suggesting edema and small bilateral pleural effusions.  EKG shows a left bundle-branch block which is old with atrial fib at a rate of 74 beats per minute.  Remarkable for hemoglobin of 11.2.  BNP is 8644, point of cares were negative.  INR is 3.81.  ASSESSMENT/PLAN: 1. Acute on chronic systolic congestive heart failure.  He has had     severe left ventricular dysfunction with EF of 25-30%, prior to his     aortic valve replacement.  He has been orthopneic since going home     from surgery.  His weight is up an additional of 6 pounds over the     last 5 or 6 days.  He is very compliant and his wife seems to be a     very responsible caretaker. 2. Coronary artery disease status post coronary artery bypass     grafting. 3. Chronic atrial fibrillation. 4. Anticoagulation with a supratherapeutic INR today. 5. Multifactorial anemia.  PLAN:  He has responded well with 40 mg of IV Lasix in the ER.  We will give him an additional 40 mg this evening and also potassium replacement.  We will have pharmacy manage his anticoagulation which will be held today.  We will continue his other medications for now.  I do note that a DC cardioversion is planned for December 05, 2010.  We will continue amiodarone.  His wife seems very well Versed.  I also reviewed at length how to chronically manage his congestive heart failure.  In addition, we talked about salt restriction as well as signs of orthopnea being suggestive of worsening heart failure.  We may want to consider adding spironolactone prior to discharge.  The patient and wife understand plan, agreed to proceed.  All questions answered.     Patrick Kohrs C. Daleen Squibb, MD, [facility]     TCW/MEDQ  D:  11/28/2010  T:  11/29/2010  Job:  [id number]  Electronically Signed by Patrick Castle MD [facility] on  12/04/2010 01:52:02 PM

## 2010-12-05 ENCOUNTER — Other Ambulatory Visit: Payer: Self-pay | Admitting: Cardiology

## 2010-12-05 ENCOUNTER — Other Ambulatory Visit: Payer: Self-pay

## 2010-12-05 ENCOUNTER — Telehealth: Payer: Self-pay | Admitting: Cardiology

## 2010-12-05 ENCOUNTER — Ambulatory Visit (INDEPENDENT_AMBULATORY_CARE_PROVIDER_SITE_OTHER): Payer: Medicare Other | Admitting: *Deleted

## 2010-12-05 ENCOUNTER — Other Ambulatory Visit (INDEPENDENT_AMBULATORY_CARE_PROVIDER_SITE_OTHER): Payer: Medicare Other | Admitting: *Deleted

## 2010-12-05 DIAGNOSIS — I4891 Unspecified atrial fibrillation: Secondary | ICD-10-CM

## 2010-12-05 DIAGNOSIS — Z952 Presence of prosthetic heart valve: Secondary | ICD-10-CM

## 2010-12-05 DIAGNOSIS — I359 Nonrheumatic aortic valve disorder, unspecified: Secondary | ICD-10-CM

## 2010-12-05 DIAGNOSIS — Z7901 Long term (current) use of anticoagulants: Secondary | ICD-10-CM

## 2010-12-05 LAB — CBC WITH DIFFERENTIAL/PLATELET
Basophils Relative: 0.6 % (ref 0.0–3.0)
Hemoglobin: 11.7 g/dL — ABNORMAL LOW (ref 13.0–17.0)
Lymphocytes Relative: 13.4 % (ref 12.0–46.0)
Monocytes Relative: 10.8 % (ref 3.0–12.0)
Neutro Abs: 3.9 10*3/uL (ref 1.4–7.7)
Neutrophils Relative %: 72.3 % (ref 43.0–77.0)
RBC: 3.44 Mil/uL — ABNORMAL LOW (ref 4.22–5.81)
WBC: 5.4 10*3/uL (ref 4.5–10.5)

## 2010-12-05 LAB — BASIC METABOLIC PANEL
BUN: 26 mg/dL — ABNORMAL HIGH (ref 6–23)
CO2: 30 mEq/L (ref 19–32)
Calcium: 9.6 mg/dL (ref 8.4–10.5)
Creatinine, Ser: 1.2 mg/dL (ref 0.4–1.5)
Glucose, Bld: 87 mg/dL (ref 70–99)

## 2010-12-05 LAB — POCT INR: INR: 2.1

## 2010-12-05 LAB — HEPATIC FUNCTION PANEL
Albumin: 3.8 g/dL (ref 3.5–5.2)
Total Protein: 6.5 g/dL (ref 6.0–8.3)

## 2010-12-05 MED ORDER — FERROUS GLUCONATE 324 (38 FE) MG PO TABS: 324.0000 mg | ORAL_TABLET | Freq: Two times a day (BID) | ORAL | Status: DC

## 2010-12-05 NOTE — Telephone Encounter (Signed)
Pt was in hospital and was given Zocor.  He had a pres for Lipitor.  She has Lipitor already filled. When he runs out should he switch to Zocor or stay on Lipitor. Also, does he need to continue the iron medication this was sent for a refill.  Please call then with info.  He is coming for coumadin check today.

## 2010-12-05 NOTE — Telephone Encounter (Signed)
Message copied by Lorayne Bender on Mon Dec 05, 2010  4:32 PM ------      Message from: [place], PETER M      Created: Mon Dec 05, 2010  3:01 PM       Chemistries are ok. Mild anemia has improved. Mildly elevated TSH. Recommend repeat TSH and free T4 in one month. OK to proceed with Cardioversion.      Theron Arista [place]

## 2010-12-05 NOTE — Telephone Encounter (Signed)
Called wife w/instructions for CV on 10/25. Had labs today. Also advised that I sent refill in for iron.

## 2010-12-05 NOTE — Telephone Encounter (Signed)
Lm w/lab results. Will proceed w/CV. Will send copy to Dr. Clarene Duke. Need to repeat TSH,Free T4 in one month.

## 2010-12-05 NOTE — Telephone Encounter (Signed)
Pt called back, stated she went for the refill on the iron medication and it has not been sent.  Stated the phar faxed on 10-16 with no reply.

## 2010-12-06 ENCOUNTER — Encounter: Payer: Self-pay | Admitting: Cardiology

## 2010-12-06 ENCOUNTER — Other Ambulatory Visit: Payer: Self-pay | Admitting: *Deleted

## 2010-12-08 ENCOUNTER — Ambulatory Visit (HOSPITAL_COMMUNITY)
Admission: RE | Admit: 2010-12-08 | Discharge: 2010-12-08 | Disposition: A | Discharge status: Home / Self Care | Payer: Medicare Other | Source: Ambulatory Visit | Attending: Cardiology | Admitting: Cardiology

## 2010-12-08 DIAGNOSIS — I447 Left bundle-branch block, unspecified: Secondary | ICD-10-CM | POA: Insufficient documentation

## 2010-12-08 DIAGNOSIS — I4891 Unspecified atrial fibrillation: Secondary | ICD-10-CM | POA: Insufficient documentation

## 2010-12-09 ENCOUNTER — Telehealth: Payer: Self-pay | Admitting: Cardiology

## 2010-12-09 NOTE — Telephone Encounter (Signed)
Wife called stating he was not CV yesterday because he had eaten. Wants to reschedule ASAP. Dr. Shirlee Latch is hosp DOD on Monday per Trish. Put him on schedule at 10:00 for Dr. Shirlee Latch. Advised wife to be there at 8:00 am and NOT to eat or drink after midnight. Can take meds with sip of water. She will comply.

## 2010-12-09 NOTE — Telephone Encounter (Signed)
Pt calling wanting to know when pt cardiac cath will be rs. Please return pt call to discuss further.

## 2010-12-12 ENCOUNTER — Ambulatory Visit (HOSPITAL_COMMUNITY)
Admission: RE | Admit: 2010-12-12 | Discharge: 2010-12-12 | Disposition: A | Discharge status: Home / Self Care | Payer: Medicare Other | Source: Ambulatory Visit | Attending: Cardiology | Admitting: Cardiology

## 2010-12-12 DIAGNOSIS — Z01810 Encounter for preprocedural cardiovascular examination: Secondary | ICD-10-CM | POA: Insufficient documentation

## 2010-12-12 DIAGNOSIS — E785 Hyperlipidemia, unspecified: Secondary | ICD-10-CM | POA: Insufficient documentation

## 2010-12-12 DIAGNOSIS — I4891 Unspecified atrial fibrillation: Secondary | ICD-10-CM

## 2010-12-12 DIAGNOSIS — Z954 Presence of other heart-valve replacement: Secondary | ICD-10-CM | POA: Insufficient documentation

## 2010-12-12 DIAGNOSIS — Z8673 Personal history of transient ischemic attack (TIA), and cerebral infarction without residual deficits: Secondary | ICD-10-CM | POA: Insufficient documentation

## 2010-12-12 DIAGNOSIS — I10 Essential (primary) hypertension: Secondary | ICD-10-CM | POA: Insufficient documentation

## 2010-12-12 DIAGNOSIS — J438 Other emphysema: Secondary | ICD-10-CM | POA: Insufficient documentation

## 2010-12-12 DIAGNOSIS — I509 Heart failure, unspecified: Secondary | ICD-10-CM | POA: Insufficient documentation

## 2010-12-12 LAB — PROTIME-INR
INR: 2.62 — ABNORMAL HIGH (ref 0.00–1.49)
Prothrombin Time: 28.4 seconds — ABNORMAL HIGH (ref 11.6–15.2)

## 2010-12-14 ENCOUNTER — Ambulatory Visit (INDEPENDENT_AMBULATORY_CARE_PROVIDER_SITE_OTHER): Payer: Medicare Other | Admitting: *Deleted

## 2010-12-14 DIAGNOSIS — Z7901 Long term (current) use of anticoagulants: Secondary | ICD-10-CM

## 2010-12-14 DIAGNOSIS — Z952 Presence of prosthetic heart valve: Secondary | ICD-10-CM

## 2010-12-14 DIAGNOSIS — I4891 Unspecified atrial fibrillation: Secondary | ICD-10-CM

## 2010-12-14 LAB — POCT INR: INR: 2.8

## 2010-12-17 NOTE — Consult Note (Signed)
  NAME:  [name], [name] NO.:  [id number]  MEDICAL RECORD NO.:  [id number]  LOCATION:  [facility]                         FACILITY:  [facility]  PHYSICIAN:  Noralyn Pick. Eden Emms, MD, FACCDATE OF BIRTH:  February 07, 1927  DATE OF CONSULTATION: DATE OF DISCHARGE:  12/12/2010                                Cardioversion   A 77 year old patient status post aortic valve replacement, postop atrial fibrillation, therapeutic INR over the last 3 weeks.  Direct current cardioversion was set up by Dr. Swaziland.  The patient was anesthetized with 100 mg of propofol and 40 mg of lidocaine.  The single biphasic 150 joule shock was delivered.  The patient converted from atrial fibrillation at a rate of 92 to normal sinus rhythm at a rate of 64.  IMPRESSION:  Successful cardioversion with no immediate neurological sequela.     Noralyn Pick. Eden Emms, MD, [facility]     PCN/MEDQ  D:  12/12/2010  T:  12/12/2010  Job:  [id number]  cc:   Lyndsy Gilberto M. Swaziland, M.D.  Electronically Signed by Charlton Haws MD [facility] on 12/17/2010 08:51:08 PM

## 2010-12-26 ENCOUNTER — Ambulatory Visit (INDEPENDENT_AMBULATORY_CARE_PROVIDER_SITE_OTHER): Payer: Medicare Other | Admitting: Nurse Practitioner

## 2010-12-26 ENCOUNTER — Encounter: Payer: Self-pay | Admitting: Nurse Practitioner

## 2010-12-26 ENCOUNTER — Telehealth: Payer: Self-pay | Admitting: Emergency Medicine

## 2010-12-26 ENCOUNTER — Ambulatory Visit (INDEPENDENT_AMBULATORY_CARE_PROVIDER_SITE_OTHER): Payer: Medicare Other | Admitting: *Deleted

## 2010-12-26 VITALS — BP 142/68 | HR 61 | Ht 71.0 in | Wt 161.8 lb

## 2010-12-26 DIAGNOSIS — Z7901 Long term (current) use of anticoagulants: Secondary | ICD-10-CM

## 2010-12-26 DIAGNOSIS — IMO0002 Reserved for concepts with insufficient information to code with codable children: Secondary | ICD-10-CM

## 2010-12-26 DIAGNOSIS — R109 Unspecified abdominal pain: Secondary | ICD-10-CM | POA: Insufficient documentation

## 2010-12-26 DIAGNOSIS — R58 Hemorrhage, not elsewhere classified: Secondary | ICD-10-CM

## 2010-12-26 DIAGNOSIS — I420 Dilated cardiomyopathy: Secondary | ICD-10-CM

## 2010-12-26 DIAGNOSIS — I4891 Unspecified atrial fibrillation: Secondary | ICD-10-CM

## 2010-12-26 DIAGNOSIS — I428 Other cardiomyopathies: Secondary | ICD-10-CM

## 2010-12-26 DIAGNOSIS — Z952 Presence of prosthetic heart valve: Secondary | ICD-10-CM

## 2010-12-26 MED ORDER — FUROSEMIDE 40 MG PO TABS: 40.0000 mg | ORAL_TABLET | Freq: Every day | ORAL | Status: DC

## 2010-12-26 MED ORDER — POTASSIUM CHLORIDE CRYS ER 20 MEQ PO TBCR: 20.0000 meq | EXTENDED_RELEASE_TABLET | Freq: Every day | ORAL | Status: DC

## 2010-12-26 NOTE — Assessment & Plan Note (Signed)
Looks compensated and having no CHF symptoms at the present time. I have left him on his current regimen.

## 2010-12-26 NOTE — Telephone Encounter (Signed)
ERROR BY COMPUTER 

## 2010-12-26 NOTE — Assessment & Plan Note (Signed)
This is has been present for the past couple of weeks. No worse but no better. He is a little tender on exam. Has plans for CT of the abdomen and pelvis later this week with planned follow up with Dr. Darrick Penna. We will check some labs today as well.

## 2010-12-26 NOTE — Assessment & Plan Note (Signed)
INR was 2.9 today. He is reporting blood in the stool which he has not shared with anyone. We will check CBC and send him with stool cards. He will be having CT of the abdomen and pelvis later this week.

## 2010-12-26 NOTE — Assessment & Plan Note (Signed)
He is in sinus rhythm. He remains on his amiodarone and coumadin. He does report some blood in the stool. We will be checking stool cards and labs today. Will need to discuss with Dr. Swaziland his long term plans for anticoagulation.

## 2010-12-26 NOTE — Patient Instructions (Addendum)
We are going to check some labs today. We need to get some stool cards done to evaluate this bleeding in your stool.  Stay on your current medicines.  We will see you back in about 6 weeks.   Keep your appointment for your CT scan later this week.

## 2010-12-26 NOTE — Progress Notes (Signed)
[name] Date of Birth: 03-21-1926 Medical Record #[id number]  History of Present Illness: [name] is seen today for a post hospital visit. He is seen for Dr. Swaziland. He has quite a complex history with multiple recent events. He had had hernia surgery in August and did ok. Then got short of breath and was in the ER over Labor Day with CHF vs pneumonia. Found to have worsening AS and had AVR done. Then had post op atrial fib and has been on coumadin and amiodarone. Had recurrent CHF exacerbation in October. Cardioversion was performed on October 29th.   From his heart standpoint, he says he is ok. No chest pain or shortness of breath. No coughing. No swelling. Wife reports that his weights have been stable. He remains on his Lasix and potassium. He is now complaining of right sided abdominal pain. This has been going on for the past 2 to 3 weeks. Has also had blood in his stool. Has not told anyone until now about that. He remains on Coumadin. He is nauseated and not able to eat. He will be having a CT of the abdomen and pelvis later this week for Dr. Darrick Penna due to his endovascular leak around his stent graft. No fevers or chills reported. Still has his gallbladder and appendix per his knowledge.  Current Outpatient Prescriptions on File Prior to Visit  Medication Sig Dispense Refill  . amiodarone (PACERONE) 200 MG tablet Take 200 mg by mouth daily.       [name] aspirin 81 MG tablet Take 81 mg by mouth daily.        . carvedilol (COREG) 6.25 MG tablet Take 6.25 mg by mouth 2 (two) times daily with a meal.       . ferrous gluconate (FERGON) 324 MG tablet Take 1 tablet (324 mg total) by mouth 2 (two) times daily.  30 tablet  5  . Multiple Vitamin (MULTIVITAMIN) tablet Take 1 tablet by mouth daily.        [name] oxyCODONE-acetaminophen (PERCOCET) 5-325 MG per tablet Take 1 tablet by mouth every 4 (four) hours as needed.        . ramipril (ALTACE) 5 MG capsule Take 5 mg by mouth daily.        .  simvastatin (ZOCOR) 20 MG tablet Take 20 mg by mouth at bedtime.        [name] warfarin (COUMADIN) 5 MG tablet Take 5 mg by mouth daily. Or as directed Monitored by Dr Swaziland      . DISCONTD: furosemide (LASIX) 40 MG tablet Take 40 mg by mouth daily.        [name] DISCONTD: potassium chloride SA (K-DUR,KLOR-CON) 20 MEQ tablet Take 20 mEq by mouth daily.        [name] DISCONTD: ferrous gluconate (FERGON) 325 MG tablet TAKE 1 TABLET TWICE A DAY  60 tablet  0    No Known Allergies  Past Medical History  Diagnosis Date  . Atrial fibrillation     s/p cardioversion October 2012  . Hypertension   . AAA (abdominal aortic aneurysm)     s/p stent graft in September 2009 & with attempted embolization/occlusion of vessels for a type 2 leak around the stent graft; Managed by Dr. Darrick Penna.  . History of carotid stenosis     s/p L CEA in September 2006  . Gout   . Arthritis   . Hyperlipidemia   . Stroke 2006    three strokes   .  Wears glasses   . Coronary artery disease     Remote CABG x 5 in 2000  . Aortic stenosis     s/p AVR in September 2012  . PVC's (premature ventricular contractions)   . PAT (paroxysmal atrial tachycardia)   . Inguinal hernia     RIH  . Emphysema of lung   . High risk medication use     on amiodarone  . Chronic anticoagulation     Past Surgical History  Procedure Date  . Tonsillectomy   . Joint replacement     Right TKR  . Abdominal aortic aneurysm repair 2008    Gore Excluder Stent Graft repair  . Carotid endarterectomy 2006    Left Side  . Hemorrhoid surgery   . Coronary artery bypass graft 1970    bypass and open heart surgery  . Replacement total knee 2008  . Abdominal aortic aneurysm repair 2010/2012  . Colon surgery     lap right colon  . Cardiac catheterization 04/29/1998    EF 60%  . Coronary artery bypass graft 2000    5 vessel BY DR.BARTEL. LIMA GRAFT TO THE LAD, SEQUENTIAL SAPHENOUS VEIN GRAFT TO THE  FIRST DIAGONAL AND FIRST OBTUSE MARGINAL VESSELS,  SAPHENOUS VEIN GRAFT TO THE SECOND OBTUSE MARGINAL VESSEL, AND SAPHENOUS VEIN GRAFT TO THE PDA  . US echocardiography 08/18/2009    EF 55-60%  . US echocardiography 04/22/2007    EF 55-60%  . Cardiovascular stress test 08/19/2009  . Aortic valve replacement 10/24/10    AVR  #25 mm Magna Ease pericardial valve  . Hernia repair 10/13/10    RIH    History  Smoking status  . Former Smoker  . Types: Cigars  Smokeless tobacco  . Never Used    History  Alcohol Use  . 1.8 oz/week  . 3 Cans of beer per week    Family History  Problem Relation Age of Onset  . Other Mother     falopian tube during pregnancy   . Heart disease Father   . Cancer Brother     liver     Review of Systems: The review of systems is positive for right sided abdominal pain. Not any worse but not any better. No CHF symptoms.  All other systems were reviewed and are negative.  Physical Exam: BP 142/68  Pulse 61  Ht 5\' 11"  (1.803 m)  Wt 161 lb 12.8 oz (73.392 kg)  BMI 22.57 kg/m2 Patient is an elderly white male who is in no acute distress. Does look chronically ill. Skin is warm and dry. Color is normal.  HEENT is unremarkable. Normocephalic/atraumatic. PERRL. Sclera are nonicteric. Neck is supple. No masses. No JVD. Lungs are clear. Cardiac exam shows a regular rate and rhythm. Abdomen is soft. Some tenderness noted on the right with palpation. Extremities are without edema. Gait and ROM are intact. No gross neurologic deficits noted.   LABORATORY DATA: EKG shows sinus rhythm, first degree AV block, LBBB.    Assessment / Plan:

## 2010-12-27 LAB — CBC WITH DIFFERENTIAL/PLATELET
Basophils Absolute: 0 10*3/uL (ref 0.0–0.1)
Basophils Relative: 0.3 % (ref 0.0–3.0)
Eosinophils Absolute: 0 10*3/uL (ref 0.0–0.7)
Eosinophils Relative: 0.4 % (ref 0.0–5.0)
HCT: 37.9 % — ABNORMAL LOW (ref 39.0–52.0)
Hemoglobin: 12.7 g/dL — ABNORMAL LOW (ref 13.0–17.0)
Lymphocytes Relative: 17.9 % (ref 12.0–46.0)
Lymphs Abs: 0.9 10*3/uL (ref 0.7–4.0)
MCHC: 33.5 g/dL (ref 30.0–36.0)
MCV: 100.4 fl — ABNORMAL HIGH (ref 78.0–100.0)
Monocytes Absolute: 0.4 10*3/uL (ref 0.1–1.0)
Monocytes Relative: 7.6 % (ref 3.0–12.0)
Neutro Abs: 3.6 10*3/uL (ref 1.4–7.7)
Neutrophils Relative %: 73.8 % (ref 43.0–77.0)
Platelets: 150 10*3/uL (ref 150.0–400.0)
RBC: 3.78 Mil/uL — ABNORMAL LOW (ref 4.22–5.81)
RDW: 15.8 % — ABNORMAL HIGH (ref 11.5–14.6)
WBC: 4.9 10*3/uL (ref 4.5–10.5)

## 2010-12-27 LAB — BASIC METABOLIC PANEL
BUN: 24 mg/dL — ABNORMAL HIGH (ref 6–23)
CO2: 31 mEq/L (ref 19–32)
Calcium: 9.3 mg/dL (ref 8.4–10.5)
Chloride: 100 mEq/L (ref 96–112)
Creatinine, Ser: 1 mg/dL (ref 0.4–1.5)
GFR: 74.18 mL/min (ref >60.00)
Glucose, Bld: 140 mg/dL — ABNORMAL HIGH (ref 70–99)
Potassium: 4.8 mEq/L (ref 3.5–5.1)
Sodium: 137 mEq/L (ref 135–145)

## 2010-12-28 ENCOUNTER — Encounter: Payer: Self-pay | Admitting: Vascular Surgery

## 2010-12-28 LAB — AMYLASE: Amylase: 87 U/L (ref 27–131)

## 2010-12-28 LAB — LIPASE: Lipase: 43 U/L (ref 11.0–59.0)

## 2010-12-29 ENCOUNTER — Ambulatory Visit
Admission: RE | Admit: 2010-12-29 | Discharge: 2010-12-29 | Disposition: A | Discharge status: Home / Self Care | Payer: Medicare Other | Source: Ambulatory Visit | Attending: Vascular Surgery | Admitting: Vascular Surgery

## 2010-12-29 ENCOUNTER — Other Ambulatory Visit: Payer: Medicare Other

## 2010-12-29 ENCOUNTER — Other Ambulatory Visit (INDEPENDENT_AMBULATORY_CARE_PROVIDER_SITE_OTHER): Payer: Medicare Other | Admitting: Vascular Surgery

## 2010-12-29 ENCOUNTER — Telehealth: Payer: Self-pay | Admitting: Emergency Medicine

## 2010-12-29 ENCOUNTER — Ambulatory Visit: Payer: Medicare Other | Admitting: Vascular Surgery

## 2010-12-29 ENCOUNTER — Encounter: Payer: Self-pay | Admitting: Vascular Surgery

## 2010-12-29 ENCOUNTER — Ambulatory Visit (INDEPENDENT_AMBULATORY_CARE_PROVIDER_SITE_OTHER): Payer: Medicare Other | Admitting: Vascular Surgery

## 2010-12-29 VITALS — BP 150/78 | HR 61 | Resp 14 | Ht 71.0 in | Wt 155.0 lb

## 2010-12-29 DIAGNOSIS — I6529 Occlusion and stenosis of unspecified carotid artery: Secondary | ICD-10-CM

## 2010-12-29 DIAGNOSIS — Z9889 Other specified postprocedural states: Secondary | ICD-10-CM

## 2010-12-29 DIAGNOSIS — R1011 Right upper quadrant pain: Secondary | ICD-10-CM

## 2010-12-29 DIAGNOSIS — Z48812 Encounter for surgical aftercare following surgery on the circulatory system: Secondary | ICD-10-CM

## 2010-12-29 DIAGNOSIS — I714 Abdominal aortic aneurysm, without rupture: Secondary | ICD-10-CM

## 2010-12-29 MED ORDER — IOHEXOL 350 MG/ML SOLN
100.0000 mL | Freq: Once | INTRAVENOUS | Status: AC | PRN
  Administered 2010-12-29: 100 mL via INTRAVENOUS

## 2010-12-29 NOTE — Telephone Encounter (Signed)
ERROR BY COMPUTER

## 2010-12-29 NOTE — Progress Notes (Signed)
Addended by: Phillips Odor on: 12/29/2010 05:03 PM   Modules accepted: Orders

## 2010-12-29 NOTE — Progress Notes (Signed)
[facility] HISTORY AND PHYSICAL    History of Present Illness:  Patient is a 77 y.o. year old male who presents for follow-up evaluation of AAA. He underwent Gore Excluder aneurysm stent graft repair in 2009.  He subsequently had coil embolization of a type II endoleak in June. The patient denies new back pain.  The patient's atherosclerotic risk factors remain COPD, hypertension, PAD, atrial fibrillation, and  coronary artery disease.  These are all currently stable and followed by his primary care physician. He also had prior left CEA in 2006 and denies symptoms of TIA amaurosis or stroke.  He has also had a several week history of right upper quadrant pain. This is been painful to him that has caused him difficulty sleeping. The pain is fairly continuous in nature. It does not seem to be related to meals. He does not seem to really have history related to greasy or fatty foods exacerbating this.  Past Medical History  Diagnosis Date  . Atrial fibrillation     s/p cardioversion October 2012  . Hypertension   . AAA (abdominal aortic aneurysm)     s/p stent graft in September 2009 & with attempted embolization/occlusion of vessels for a type 2 leak around the stent graft; Managed by Dr. Darrick Penna.  . History of carotid stenosis     s/p L CEA in September 2006  . Gout   . Arthritis   . Hyperlipidemia   . Stroke 2006    three strokes   . Wears glasses   . Coronary artery disease     Remote CABG x 5 in 2000  . Aortic stenosis     s/p AVR in September 2012  . PVC's (premature ventricular contractions)   . PAT (paroxysmal atrial tachycardia)   . Inguinal hernia     RIH  . Emphysema of lung   . High risk medication use     on amiodarone  . Chronic anticoagulation       History   Social History  . Marital Status: Married    Spouse Name: N/A    Number of Children: N/A  . Years of Education: N/A   Occupational History  . Not on file.   Social History  Main Topics  . Smoking status: Former Smoker    Types: Cigars  . Smokeless tobacco: Never Used  . Alcohol Use: 1.8 oz/week    3 Cans of beer per week  . Drug Use: No  . Sexually Active: Not on file   Other Topics Concern  . Not on file   Social History Narrative  . No narrative on file   Review of systems: Cardiac: Denies chest pain  Pulmonary: Denies shortness of breath  GI: denies nausea or vomiting  Physical Exam: Filed Vitals:   12/29/10 1133  BP: 150/78  Pulse: 61  Resp: 14  Height: 5\' 11"  (1.803 m)  Weight: 155 lb (70.308 kg)  SpO2: 94%    HEENT: Negative  Neck: No carotid bruit  Chest: Clear to auscultation bilaterally  Cardiac: Regular rate and rhythm without murmur  Abdomen: Soft mild right upper quadrant tenderness without Murphy sign, primary center of pain is over the area of the gallbladder, no palpable pulsatile mass  Extremities 2+ femoral pulses   Data: CT Angio gram of the abdomen and pelvis was reviewed today. These images showed that the type II endoleak previously seen is coil embolized with no evidence of recurrent leak. The aneurysm diameter  6.4 cm in diameter and stable. There are multiple gallstones in the gallbladder. There is no significant ductal dilatation.  Carotid duplex ultrasound was performed in our office today I reviewed and interpreted this. This shows no significant carotid stenosis bilaterally although he may have a small area of dissection on the right side which is chronic.  Assessment: #1 right upper quadrant pain most likely symptomatic cholelithiasis we'll refer him to Dr. Dwain Sarna for further evaluation of this  #2 abdominal aortic aneurysm status post Gore Excluder repair With type II endoleak resolved and stable aneurysm diameter 6.4 cm repeat abdominal aortic aneurysm ultrasound in one year  #3 prior carotid endarterectomy with no recurrent stenosis at this point repeat carotid duplex in 1 year    ASSESSMENT:    Doing well status post Gore Excluder stent graft repair of iliac aneurysms   PLAN: Patient will return in one year for CT angiogram abdomen and pelvis to review his stent graft. Very been stable at that time we will probably do a once yearly ultrasound.   Fabienne Bruns, MD [facility] Office: [phone] Pager: [phone]

## 2011-01-02 ENCOUNTER — Telehealth: Payer: Self-pay | Admitting: Cardiology

## 2011-01-02 MED ORDER — RAMIPRIL 5 MG PO CAPS: 5.0000 mg | ORAL_CAPSULE | Freq: Every day | ORAL | Status: DC

## 2011-01-02 NOTE — Telephone Encounter (Signed)
Refill ramipril 5 mg, uses [facility] [address], 30 days with refills

## 2011-01-02 NOTE — Progress Notes (Signed)
Addended by: Sharee Pimple on: 01/02/2011 11:34 AM   Modules accepted: Orders

## 2011-01-02 NOTE — Progress Notes (Signed)
Addended by: Sharee Pimple on: 01/02/2011 10:33 AM   Modules accepted: Orders

## 2011-01-03 ENCOUNTER — Encounter: Payer: Self-pay | Admitting: Surgery

## 2011-01-03 ENCOUNTER — Ambulatory Visit (INDEPENDENT_AMBULATORY_CARE_PROVIDER_SITE_OTHER): Payer: Self-pay | Admitting: Surgery

## 2011-01-03 ENCOUNTER — Encounter: Payer: Self-pay | Admitting: *Deleted

## 2011-01-03 VITALS — BP 114/62 | HR 76 | Resp 16 | Ht 71.0 in | Wt 155.0 lb

## 2011-01-03 DIAGNOSIS — K8020 Calculus of gallbladder without cholecystitis without obstruction: Secondary | ICD-10-CM

## 2011-01-03 DIAGNOSIS — I359 Nonrheumatic aortic valve disorder, unspecified: Secondary | ICD-10-CM

## 2011-01-03 NOTE — Progress Notes (Signed)
  HPI:  Patient returns for routine postoperative follow-up having undergone redo median sternotomy for aortic valve replacement using a 25 mm Edwards pericardial valve on 10/24/2010. The patient's early postoperative recovery while in the hospital was notable for postoperative atrial fibrillation. Since hospital discharge the patient reports that he has undergone cardioversion recently. He has seen Dr. Leonette Most fields recently and had a followup CT angiogram of the abdomen showing that his type 2 endoleak as closed. He also reports a 7-10 day history of right upper quadrant abdominal pain and the CT scan showed gallstones. He has an appointment to see a general surgeon in the near future.  He denies any chest pain or shortness of breath. His energy level has been good.   Current Outpatient Prescriptions  Medication Sig Dispense Refill  . amiodarone (PACERONE) 200 MG tablet Take 200 mg by mouth daily.       [name] aspirin 81 MG tablet Take 81 mg by mouth daily.        . carvedilol (COREG) 6.25 MG tablet Take 6.25 mg by mouth 2 (two) times daily with a meal.       . ferrous gluconate (FERGON) 324 MG tablet Take 1 tablet (324 mg total) by mouth 2 (two) times daily.  30 tablet  5  . furosemide (LASIX) 40 MG tablet Take 1 tablet (40 mg total) by mouth daily.  30 tablet  6  . Multiple Vitamin (MULTIVITAMIN) tablet Take 1 tablet by mouth daily.        [name] oxyCODONE-acetaminophen (PERCOCET) 5-325 MG per tablet Take 1 tablet by mouth every 4 (four) hours as needed.        . ramipril (ALTACE) 5 MG capsule Take 1 capsule (5 mg total) by mouth daily.  30 capsule  5  . simvastatin (ZOCOR) 20 MG tablet Take 20 mg by mouth at bedtime.        [name] warfarin (COUMADIN) 5 MG tablet Take 5 mg by mouth daily. Or as directed Monitored by Dr Swaziland      . potassium chloride SA (K-DUR,KLOR-CON) 20 MEQ tablet Take 1 tablet (20 mEq total) by mouth daily.  30 tablet  6    Physical Exam:  BP 114/62  Pulse 76  Resp 16  Ht 5'  11" (1.803 m)  Wt 155 lb (70.308 kg)  BMI 21.62 kg/m2  SpO2 99%  He looks well. Cardiac exam shows a regular rate and rhythm with normal heart sounds. Lung exam is clear. The chest incision is healing well and sternum is stable. There is no peripheral edema.     Impression:  He is making good recovery following redo sternotomy and aortic valve replacement. I told him that he should wait until December 10 before returning to full activity.  Plan:  I told him that he did not need to return to see me unless he develops any problems with his incision. He has appointment with general surgery in the near future and will likely require cholecystectomy.

## 2011-01-04 ENCOUNTER — Telehealth: Payer: Self-pay | Admitting: Emergency Medicine

## 2011-01-04 NOTE — Telephone Encounter (Signed)
MADE WIFE AWARE THAT DR Fredia Sorrow WILL LET PT F/U W/ DR FIELDS UNLESS NEEDED HERE AGAIN.  IF PT WOULD LIKE TO GO OVER CT-A, DR GY WILL BE HAPPY TO SEE HIM.

## 2011-01-06 ENCOUNTER — Ambulatory Visit (INDEPENDENT_AMBULATORY_CARE_PROVIDER_SITE_OTHER): Payer: Medicare Other | Admitting: *Deleted

## 2011-01-06 DIAGNOSIS — I4891 Unspecified atrial fibrillation: Secondary | ICD-10-CM

## 2011-01-06 DIAGNOSIS — Z7901 Long term (current) use of anticoagulants: Secondary | ICD-10-CM

## 2011-01-06 DIAGNOSIS — Z952 Presence of prosthetic heart valve: Secondary | ICD-10-CM

## 2011-01-06 LAB — HEMOCCULT GUIAC POC 1CARD (OFFICE)

## 2011-01-06 LAB — POCT INR: INR: 3.3

## 2011-01-06 MED ORDER — WARFARIN SODIUM 5 MG PO TABS: 5.0000 mg | ORAL_TABLET | ORAL | Status: DC

## 2011-01-09 ENCOUNTER — Ambulatory Visit
Admission: RE | Admit: 2011-01-09 | Discharge: 2011-01-09 | Disposition: A | Discharge status: Home / Self Care | Payer: Medicare Other | Source: Ambulatory Visit | Attending: Vascular Surgery | Admitting: Vascular Surgery

## 2011-01-09 DIAGNOSIS — R1011 Right upper quadrant pain: Secondary | ICD-10-CM

## 2011-01-13 NOTE — Procedures (Unsigned)
CAROTID DUPLEX EXAM  INDICATION:  Follow up carotid stenosis.  HISTORY: Diabetes:  No. Cardiac:  CABG x5. Hypertension:  Yes. Smoking:  Cigar. Previous Surgery:  Left carotid endarterectomy in 2006. CV History:  asymptomatic Amaurosis Fugax No, Paresthesias No, Hemiparesis No.                                      RIGHT             LEFT Brachial systolic pressure:         142               138 Brachial Doppler waveforms:         WNL               WNL Vertebral direction of flow:        Antegrade         Antegrade DUPLEX VELOCITIES (cm/sec) CCA peak systolic                   78                60 - p/138 - m ECA peak systolic                   197               102 ICA peak systolic                   120/176           65 ICA end diastolic                   24/20             14 PLAQUE MORPHOLOGY:                  Calcified         Heterogenous PLAQUE AMOUNT:                      Mild              Mild-to-moderate PLAQUE LOCATION:                    CCA/ICA/ECA       CCA  IMPRESSION: 1. Right internal carotid artery presents with stenosis in the 1% to     39% range (high end of range). 2. Abnormal separation at the bulb with turbulent Doppler signal, may     suggest carotid dissection. 3. Right external carotid artery stenosis present. 4. Left common carotid artery disease of at least 50% in the mid     segment. 5. Left internal carotid artery is patent with history of     endarterectomy. 6. Bilateral vertebral arteries are patent and antegrade.  ___________________________________________ Patrick Hora. Fields, MD  SH/MEDQ  D:  12/29/2010  T:  12/29/2010  Job:  [id number]

## 2011-01-16 ENCOUNTER — Telehealth: Payer: Self-pay | Admitting: Cardiology

## 2011-01-16 NOTE — Telephone Encounter (Signed)
Pt calling re BP being very erratic today

## 2011-01-16 NOTE — Telephone Encounter (Signed)
Called stating BP has been up and down today. This AM was 180/71; mid day 148/67; and this afternoon 101/44. Feels fine. Just concerned because had AVR in September and thought might be related. Per Dr. Swaziland advised to monitor and if continues to be erratic then call us back. States he has not been upset or stressed today. Will monitor.

## 2011-01-17 ENCOUNTER — Encounter (INDEPENDENT_AMBULATORY_CARE_PROVIDER_SITE_OTHER): Payer: Self-pay | Admitting: General Surgery

## 2011-01-17 ENCOUNTER — Ambulatory Visit (INDEPENDENT_AMBULATORY_CARE_PROVIDER_SITE_OTHER): Payer: Medicare Other | Admitting: General Surgery

## 2011-01-17 VITALS — BP 120/58 | HR 68 | Temp 97.9°F | Resp 16 | Ht 71.0 in | Wt 161.4 lb

## 2011-01-17 DIAGNOSIS — K8020 Calculus of gallbladder without cholecystitis without obstruction: Secondary | ICD-10-CM

## 2011-01-17 NOTE — Progress Notes (Signed)
Patient ID: Patrick E Walberg Sr., male   DOB: 11/06/1926, 77 y.o.   MRN: [id number]  Chief Complaint  Patient presents with  . Other    ets pt new prob- eval GB with stones    HPI Patrick E Culton Sr. is a 77 y.o. male.   HPI This is an 77-year-old male who I know well from his prior laparoscopic colectomy as well as an inguinal hernia repair. Since I last saw him he has had a aortic valve replacement. He has done fairly well from that and shortness of breath is now much better. He is otherwise recovering very well. 3-4 weeks ago he had the beginning of right upper quadrant pain that radiated to his back underneath the scapula needs we could not sleep. He can't really associate this with any eating ny other factors. Relief was only obtained by some of his medication as well as time. His initial episode was a nagging pain for about a week. This then resolved. Since then he has occasional right upper quadrant pain. His appetite has not really changed he has no real nausea or vomiting. His bowel movements and change in gallbladder. He reports no history of jaundice. He was seen by Dr. Felds who obtained an ultrasound showing gallstones and referred back for consideration of cholecystectomy.  Past Medical History  Diagnosis Date  . Atrial fibrillation     s/p cardioversion October 2012  . Hypertension   . AAA (abdominal aortic aneurysm)     s/p stent graft in September 2009 & with attempted embolization/occlusion of vessels for a type 2 leak around the stent graft; Managed by Dr. Fields.  . History of carotid stenosis     s/p L CEA in September 2006  . Gout   . Arthritis   . Hyperlipidemia   . Stroke 2006    three strokes   . Wears glasses   . Coronary artery disease     Remote CABG x 5 in 2000  . Aortic stenosis     s/p AVR in September 2012  . PVC's (premature ventricular contractions)   . PAT (paroxysmal atrial tachycardia)   . Inguinal hernia     RIH  . Emphysema of lung   . High  risk medication use     on amiodarone  . Chronic anticoagulation   . Gallstones   . CHF (congestive heart failure)     Past Surgical History  Procedure Date  . Tonsillectomy   . Joint replacement     Right TKR  . Abdominal aortic aneurysm repair 2008    Gore Excluder Stent Graft repair  . Carotid endarterectomy 2006    Left Side  . Hemorrhoid surgery   . Coronary artery bypass graft 1970    bypass and open heart surgery  . Replacement total knee 2008  . Abdominal aortic aneurysm repair 2010/2012  . Cardiac catheterization 04/29/1998    EF 60%  . Coronary artery bypass graft 2000    5 vessel BY DR.BARTEL. LIMA GRAFT TO THE LAD, SEQUENTIAL SAPHENOUS VEIN GRAFT TO THE  FIRST DIAGONAL AND FIRST OBTUSE MARGINAL VESSELS, SAPHENOUS VEIN GRAFT TO THE SECOND OBTUSE MARGINAL VESSEL, AND SAPHENOUS VEIN GRAFT TO THE PDA  . Us echocardiography 08/18/2009    EF 55-60%  . Us echocardiography 04/22/2007    EF 55-60%  . Cardiovascular stress test 08/19/2009  . Aortic valve replacement 10/24/10    AVR  #25 mm Magna Ease pericardial valve  . Hernia repair 10/13/10      RIH  . Colon surgery     lap right colon    Family History  Problem Relation Age of Onset  . Other Mother     falopian tube during pregnancy   . Heart disease Father   . Cancer Brother     liver     Social History History  Substance Use Topics  . Smoking status: Former Smoker    Types: Cigars  . Smokeless tobacco: Never Used  . Alcohol Use: 1.8 oz/week    3 Cans of beer per week    No Known Allergies  Current Outpatient Prescriptions  Medication Sig Dispense Refill  . amiodarone (PACERONE) 200 MG tablet Take 200 mg by mouth daily.       . aspirin 81 MG tablet Take 81 mg by mouth daily.        . carvedilol (COREG) 6.25 MG tablet Take 6.25 mg by mouth 2 (two) times daily with a meal.       . ferrous gluconate (FERGON) 324 MG tablet Take 1 tablet (324 mg total) by mouth 2 (two) times daily.  30 tablet  5  . furosemide  (LASIX) 40 MG tablet Take 1 tablet (40 mg total) by mouth daily.  30 tablet  6  . Multiple Vitamin (MULTIVITAMIN) tablet Take 1 tablet by mouth daily.        . oxyCODONE-acetaminophen (PERCOCET) 5-325 MG per tablet Take 1 tablet by mouth every 4 (four) hours as needed.        . potassium chloride SA (K-DUR,KLOR-CON) 20 MEQ tablet Take 1 tablet (20 mEq total) by mouth daily.  30 tablet  6  . ramipril (ALTACE) 5 MG capsule Take 1 capsule (5 mg total) by mouth daily.  30 capsule  5  . simvastatin (ZOCOR) 20 MG tablet Take 20 mg by mouth at bedtime.        . warfarin (COUMADIN) 5 MG tablet Take 1 tablet (5 mg total) by mouth as directed. As directed by coumadin clinic  40 tablet  3    Review of Systems Review of Systems  Constitutional: Negative for fever, chills and unexpected weight change.  HENT: Negative for hearing loss, congestion, sore throat, trouble swallowing and voice change.   Eyes: Negative for visual disturbance.  Respiratory: Negative for cough and wheezing.   Cardiovascular: Negative for chest pain, palpitations and leg swelling.  Gastrointestinal: Positive for abdominal pain. Negative for nausea, vomiting, diarrhea, constipation, blood in stool, abdominal distention, anal bleeding and rectal pain.  Genitourinary: Negative for hematuria and difficulty urinating.  Musculoskeletal: Negative for arthralgias.  Skin: Negative for rash and wound.  Neurological: Negative for seizures, syncope, weakness and headaches.  Hematological: Negative for adenopathy. Bruises/bleeds easily.  Psychiatric/Behavioral: Negative for confusion.    Blood pressure 120/58, pulse 68, temperature 97.9 F (36.6 C), temperature source Temporal, resp. rate 16, height 5' 11" (1.803 m), weight 161 lb 6.4 oz (73.211 kg).  Physical Exam Physical Exam  Constitutional: He appears well-developed and well-nourished.  Eyes: No scleral icterus.  Neck: Neck supple.  Cardiovascular: Normal rate and regular rhythm.    Pulmonary/Chest: Effort normal and breath sounds normal. He has no wheezes. He has no rales.  Abdominal: Soft. He exhibits no mass. There is no hepatomegaly. There is no tenderness. There is negative Murphy's sign. No hernia.    Lymphadenopathy:    He has no cervical adenopathy.    Data Reviewed *RADIOLOGY REPORT*  Clinical Data: Right upper quadrant abdominal pain, history of    gallstones, history of repair of abdominal aortic aneurysm  COMPLETE ABDOMINAL ULTRASOUND  Comparison: CT abdomen and pelvis of 11/19/2009  Findings:  Gallbladder: Echodensities are noted within the gallbladder which  are mobile and consistent with gallstones, the largest measuring 12  mm in diameter. No pain is present over the gallbladder with  compression.  Common bile duct: The common bile duct is dilated measuring 11.5  mm in diameter. There is an echogenic focus within the common bile  duct which may represent a calculus of 4 mm in diameter.  Liver: The liver has a normal echogenic pattern. No intrahepatic  ductal dilatation is seen.  IVC: Appears normal.  Pancreas: The pancreas is largely obscured by bowel gas.  Spleen: The spleen is normal measuring 8.6 cm sagittally.  Right Kidney: No hydronephrosis is seen. The right kidney  measures 9.9 cm sagittally.  Left Kidney: No hydronephrosis. The left kidney measures 10.4 cm.  There is a small cyst in the upper pole of 1.2 x 1.1 x 1.1 cm.  Abdominal aorta: The abdominal aorta is normal in caliber with  evidence of prior endograft repair of AAA.  IMPRESSION:  1. Multiple gallstones within the gallbladder.  2. Suspect gallstone within the dilated common bile duct.  3. Evidence of prior endograft repair of AAA.   Assessment    Symptomatic cholelithiasis    Plan   I do think his symptoms are referable to his gallbladder.  We discussed surgery due to symptoms, prevention of cholecystitis and prevention of choledocholithiasis and gallstone  pancreatitis. We also will discuss with Dr. Jordan mgt of coumadin around time of surgery. I discussed the procedure in detail.  The patient was given educational material.  We discussed the risks and benefits of a laparoscopic cholecystectomy and possible cholangiogram including, but not limited to bleeding, infection, injury to surrounding structures such as the intestine or liver, bile leak, retained gallstones, need to convert to an open procedure, prolonged diarrhea, blood clots such as  DVT, common bile duct injury, anesthesia risks, and possible need for additional procedures.  The likelihood of improvement in symptoms and return to the patient's normal status is good. We discussed the typical post-operative recovery course.        Remona Boom 01/17/2011, 10:05 AM    

## 2011-01-18 ENCOUNTER — Telehealth: Payer: Self-pay | Admitting: Cardiology

## 2011-01-18 ENCOUNTER — Other Ambulatory Visit: Payer: Self-pay | Admitting: *Deleted

## 2011-01-18 ENCOUNTER — Telehealth (INDEPENDENT_AMBULATORY_CARE_PROVIDER_SITE_OTHER): Payer: Self-pay

## 2011-01-18 MED ORDER — ENOXAPARIN SODIUM 80 MG/0.8ML ~~LOC~~ SOLN: 80.0000 mg | Freq: Two times a day (BID) | SUBCUTANEOUS | Status: DC

## 2011-01-18 NOTE — Telephone Encounter (Signed)
Per Dr. Swaziland, Dr. Dwain Sarna wants Dr. Swaziland to dose his Lovenox pre op and they will handle post op. Scheduled for Lap/chole on 12/10.  Will stop Coumadin 12/5, start Lovenox 70 mg on Fri and Sat BID, and Sun AM only. Will send [facility].

## 2011-01-18 NOTE — Telephone Encounter (Signed)
New message:  Please call back regarding instructions for upcoming surgery.

## 2011-01-18 NOTE — Telephone Encounter (Signed)
Called pt's wife to notify her that the pt has been cleared from Dr Swaziland for surgery on 01-23-11. The pt will need a lovenox bridge per Dr Swaziland and Dr Dwain Sarna asked if their office would manage the Lovenox bridge since they prescribe the Coumadin. I advised the wife if she hasn't heard from Dr Elvis Coil office today to call them. The wife advised me that she went ahead and had the pt to STOP his coumadin 01-17-11./ [facility]

## 2011-01-19 ENCOUNTER — Encounter (HOSPITAL_COMMUNITY): Payer: Self-pay | Admitting: Pharmacy Technician

## 2011-01-20 ENCOUNTER — Encounter: Payer: Medicare Other | Admitting: *Deleted

## 2011-01-20 ENCOUNTER — Ambulatory Visit (HOSPITAL_COMMUNITY)
Admission: RE | Admit: 2011-01-20 | Discharge: 2011-01-20 | Disposition: A | Discharge status: Home / Self Care | Payer: Medicare Other | Source: Ambulatory Visit | Attending: General Surgery | Admitting: General Surgery

## 2011-01-20 ENCOUNTER — Encounter (HOSPITAL_COMMUNITY)
Admission: RE | Admit: 2011-01-20 | Discharge: 2011-01-20 | Disposition: A | Discharge status: Home / Self Care | Payer: Medicare Other | Source: Ambulatory Visit | Attending: General Surgery | Admitting: General Surgery

## 2011-01-20 ENCOUNTER — Encounter: Payer: Self-pay | Admitting: *Deleted

## 2011-01-20 ENCOUNTER — Encounter (HOSPITAL_COMMUNITY): Payer: Self-pay

## 2011-01-20 DIAGNOSIS — Z01818 Encounter for other preprocedural examination: Secondary | ICD-10-CM | POA: Insufficient documentation

## 2011-01-20 DIAGNOSIS — Z954 Presence of other heart-valve replacement: Secondary | ICD-10-CM | POA: Insufficient documentation

## 2011-01-20 DIAGNOSIS — Z01812 Encounter for preprocedural laboratory examination: Secondary | ICD-10-CM | POA: Insufficient documentation

## 2011-01-20 DIAGNOSIS — Z951 Presence of aortocoronary bypass graft: Secondary | ICD-10-CM | POA: Insufficient documentation

## 2011-01-20 HISTORY — DX: Frequency of micturition: R35.0

## 2011-01-20 HISTORY — DX: Anemia, unspecified: D64.9

## 2011-01-20 LAB — PROTIME-INR
INR: 1.5 — ABNORMAL HIGH (ref 0.00–1.49)
Prothrombin Time: 18.4 seconds — ABNORMAL HIGH (ref 11.6–15.2)

## 2011-01-20 LAB — COMPREHENSIVE METABOLIC PANEL
AST: 29 U/L (ref 0–37)
BUN: 25 mg/dL — ABNORMAL HIGH (ref 6–23)
CO2: 28 mEq/L (ref 19–32)
Calcium: 9.8 mg/dL (ref 8.4–10.5)
Chloride: 99 mEq/L (ref 96–112)
Creatinine, Ser: 1.05 mg/dL (ref 0.50–1.35)
GFR calc non Af Amer: 64 mL/min — ABNORMAL LOW (ref >90)
Total Bilirubin: 0.5 mg/dL (ref 0.3–1.2)

## 2011-01-20 LAB — CBC
HCT: 35.8 % — ABNORMAL LOW (ref 39.0–52.0)
MCH: 32.9 pg (ref 26.0–34.0)
MCV: 95.7 fL (ref 78.0–100.0)
Platelets: 168 10*3/uL (ref 150–400)
RDW: 15.2 % (ref 11.5–15.5)
WBC: 5 10*3/uL (ref 4.0–10.5)

## 2011-01-20 LAB — SURGICAL PCR SCREEN: MRSA, PCR: NEGATIVE

## 2011-01-20 LAB — APTT: aPTT: 50 seconds — ABNORMAL HIGH (ref 24–37)

## 2011-01-20 NOTE — Pre-Procedure Instructions (Signed)
EKG AND CARDIOLOGY OFFICE NOTES ON CHART FROM [facility] / Shawn Route NP - DATED 12/26/10.  DR. Demetrius Griffith. [name] IS MANAGING PT'S DISCONTINUATION OF COUMADIN AND TAKING [id number] (LOVENOX)  PRIOR TO SURGERY CXR WILL BE REPEATED TODAY

## 2011-01-20 NOTE — Patient Instructions (Signed)
20 MAVRIC CORTRIGHT  01/20/2011   Your procedure is scheduled on:  Monday 12/10  AT 10:54 AM  Report to [facility] at  8:30 AM.  Call this number if you have problems the morning of surgery: [phone]   Remember:   Do not eat food OR DRINK ANYTHING AFTER MIDNIGHT THE NIGHT BEFORE YOUR SURGERY.  .    Take these medicines the morning of surgery with A SIP OF WATER: AMIODARONE, CARVEDILOL   Do not wear jewelry, make-up or nail polish.  Do not wear lotions, powders, or perfumes. You may wear deodorant.  Do not shave 48 hours prior to surgery.  Do not bring valuables to the hospital.  Contacts, dentures or bridgework may not be worn into surgery.  Leave suitcase in the car. After surgery it may be brought to your room.  For patients admitted to the hospital, checkout time is 11:00 AM the day of discharge.   Patients discharged the day of surgery will not be allowed to drive home.    Special Instructions: CHG Shower Use Special Wash: 1/2 bottle night before surgery and 1/2 bottle morning of surgery.   Please read over the following fact sheets that you were given: MRSA Information

## 2011-01-23 ENCOUNTER — Encounter (HOSPITAL_COMMUNITY)
Admission: RE | Disposition: A | Discharge status: Home / Self Care | Payer: Self-pay | Source: Ambulatory Visit | Attending: General Surgery

## 2011-01-23 ENCOUNTER — Observation Stay (HOSPITAL_COMMUNITY)
Admission: RE | Admit: 2011-01-23 | Discharge: 2011-01-24 | Disposition: A | Discharge status: Home / Self Care | Payer: Medicare Other | Source: Ambulatory Visit | Attending: General Surgery | Admitting: General Surgery

## 2011-01-23 ENCOUNTER — Inpatient Hospital Stay (HOSPITAL_COMMUNITY): Payer: Medicare Other | Admitting: Anesthesiology

## 2011-01-23 ENCOUNTER — Other Ambulatory Visit (INDEPENDENT_AMBULATORY_CARE_PROVIDER_SITE_OTHER): Payer: Self-pay | Admitting: General Surgery

## 2011-01-23 ENCOUNTER — Encounter (HOSPITAL_COMMUNITY): Payer: Self-pay

## 2011-01-23 ENCOUNTER — Encounter (HOSPITAL_COMMUNITY): Payer: Self-pay | Admitting: Anesthesiology

## 2011-01-23 DIAGNOSIS — I714 Abdominal aortic aneurysm, without rupture, unspecified: Secondary | ICD-10-CM | POA: Insufficient documentation

## 2011-01-23 DIAGNOSIS — I4949 Other premature depolarization: Secondary | ICD-10-CM | POA: Insufficient documentation

## 2011-01-23 DIAGNOSIS — R1011 Right upper quadrant pain: Secondary | ICD-10-CM | POA: Insufficient documentation

## 2011-01-23 DIAGNOSIS — I4891 Unspecified atrial fibrillation: Secondary | ICD-10-CM | POA: Insufficient documentation

## 2011-01-23 DIAGNOSIS — Z7901 Long term (current) use of anticoagulants: Secondary | ICD-10-CM | POA: Insufficient documentation

## 2011-01-23 DIAGNOSIS — E785 Hyperlipidemia, unspecified: Secondary | ICD-10-CM | POA: Insufficient documentation

## 2011-01-23 DIAGNOSIS — K8010 Calculus of gallbladder with chronic cholecystitis without obstruction: Secondary | ICD-10-CM

## 2011-01-23 DIAGNOSIS — Z8673 Personal history of transient ischemic attack (TIA), and cerebral infarction without residual deficits: Secondary | ICD-10-CM | POA: Insufficient documentation

## 2011-01-23 DIAGNOSIS — J4489 Other specified chronic obstructive pulmonary disease: Secondary | ICD-10-CM | POA: Insufficient documentation

## 2011-01-23 DIAGNOSIS — Z7982 Long term (current) use of aspirin: Secondary | ICD-10-CM | POA: Insufficient documentation

## 2011-01-23 DIAGNOSIS — I509 Heart failure, unspecified: Secondary | ICD-10-CM | POA: Insufficient documentation

## 2011-01-23 DIAGNOSIS — J449 Chronic obstructive pulmonary disease, unspecified: Secondary | ICD-10-CM | POA: Insufficient documentation

## 2011-01-23 DIAGNOSIS — I10 Essential (primary) hypertension: Secondary | ICD-10-CM | POA: Insufficient documentation

## 2011-01-23 DIAGNOSIS — Z79899 Other long term (current) drug therapy: Secondary | ICD-10-CM | POA: Insufficient documentation

## 2011-01-23 DIAGNOSIS — Z951 Presence of aortocoronary bypass graft: Secondary | ICD-10-CM | POA: Insufficient documentation

## 2011-01-23 DIAGNOSIS — Z96659 Presence of unspecified artificial knee joint: Secondary | ICD-10-CM | POA: Insufficient documentation

## 2011-01-23 DIAGNOSIS — I2510 Atherosclerotic heart disease of native coronary artery without angina pectoris: Secondary | ICD-10-CM | POA: Insufficient documentation

## 2011-01-23 DIAGNOSIS — Z954 Presence of other heart-valve replacement: Secondary | ICD-10-CM | POA: Insufficient documentation

## 2011-01-23 HISTORY — PX: CHOLECYSTECTOMY: SHX55

## 2011-01-23 SURGERY — LAPAROSCOPIC CHOLECYSTECTOMY WITH INTRAOPERATIVE CHOLANGIOGRAM
Anesthesia: General | Site: Abdomen | Wound class: Contaminated

## 2011-01-23 MED ORDER — ACETAMINOPHEN 325 MG PO TABS: 650.0000 mg | ORAL_TABLET | Freq: Four times a day (QID) | ORAL | Status: DC | PRN

## 2011-01-23 MED ORDER — GLYCOPYRROLATE 0.2 MG/ML IJ SOLN
INTRAMUSCULAR | Status: DC | PRN
  Administered 2011-01-23: .8 mg via INTRAVENOUS

## 2011-01-23 MED ORDER — BUPIVACAINE-EPINEPHRINE PF 0.25-1:200000 % IJ SOLN
INTRAMUSCULAR | Status: DC | PRN
  Administered 2011-01-23: 15 mL

## 2011-01-23 MED ORDER — ACETAMINOPHEN 650 MG RE SUPP: 650.0000 mg | Freq: Four times a day (QID) | RECTAL | Status: DC | PRN

## 2011-01-23 MED ORDER — POTASSIUM CHLORIDE CRYS ER 20 MEQ PO TBCR
20.0000 meq | EXTENDED_RELEASE_TABLET | Freq: Every day | ORAL | Status: DC
  Administered 2011-01-23 – 2011-01-24 (×2): 20 meq via ORAL
  Filled 2011-01-23 (×2): qty 1

## 2011-01-23 MED ORDER — PROMETHAZINE HCL 25 MG/ML IJ SOLN: 6.2500 mg | INTRAMUSCULAR | Status: DC | PRN

## 2011-01-23 MED ORDER — WARFARIN SODIUM 2.5 MG PO TABS: 2.5000 mg | ORAL_TABLET | Freq: Every day | ORAL | Status: DC

## 2011-01-23 MED ORDER — LIDOCAINE HCL (CARDIAC) 20 MG/ML IV SOLN
INTRAVENOUS | Status: DC | PRN
  Administered 2011-01-23: 50 mg via INTRAVENOUS

## 2011-01-23 MED ORDER — AMIODARONE HCL 200 MG PO TABS
200.0000 mg | ORAL_TABLET | Freq: Every day | ORAL | Status: DC
  Administered 2011-01-23 – 2011-01-24 (×2): 200 mg via ORAL
  Filled 2011-01-23 (×2): qty 1

## 2011-01-23 MED ORDER — LACTATED RINGERS IV SOLN
INTRAVENOUS | Status: DC
  Administered 2011-01-23: 1000 mL via INTRAVENOUS
  Administered 2011-01-23 (×2): via INTRAVENOUS

## 2011-01-23 MED ORDER — FUROSEMIDE 40 MG PO TABS
40.0000 mg | ORAL_TABLET | ORAL | Status: DC
  Administered 2011-01-24: 40 mg via ORAL
  Filled 2011-01-23 (×2): qty 1

## 2011-01-23 MED ORDER — MORPHINE SULFATE 2 MG/ML IJ SOLN: 2.0000 mg | INTRAMUSCULAR | Status: DC | PRN

## 2011-01-23 MED ORDER — ONDANSETRON HCL 4 MG/2ML IJ SOLN: 4.0000 mg | Freq: Four times a day (QID) | INTRAMUSCULAR | Status: DC | PRN

## 2011-01-23 MED ORDER — EPHEDRINE SULFATE 50 MG/ML IJ SOLN
INTRAMUSCULAR | Status: DC | PRN
  Administered 2011-01-23: 10 mg via INTRAVENOUS

## 2011-01-23 MED ORDER — FERROUS GLUCONATE 324 (38 FE) MG PO TABS
324.0000 mg | ORAL_TABLET | Freq: Two times a day (BID) | ORAL | Status: DC
  Administered 2011-01-23 – 2011-01-24 (×2): 324 mg via ORAL
  Filled 2011-01-23 (×3): qty 1

## 2011-01-23 MED ORDER — WARFARIN SODIUM 5 MG PO TABS
5.0000 mg | ORAL_TABLET | ORAL | Status: DC
  Filled 2011-01-23: qty 1

## 2011-01-23 MED ORDER — BUPIVACAINE-EPINEPHRINE 0.25% -1:200000 IJ SOLN
INTRAMUSCULAR | Status: AC
  Filled 2011-01-23: qty 1

## 2011-01-23 MED ORDER — ACETAMINOPHEN 10 MG/ML IV SOLN
INTRAVENOUS | Status: AC
  Filled 2011-01-23: qty 100

## 2011-01-23 MED ORDER — SODIUM CHLORIDE 0.9 % IV SOLN
INTRAVENOUS | Status: DC
  Administered 2011-01-23: 16:00:00 via INTRAVENOUS

## 2011-01-23 MED ORDER — RAMIPRIL 5 MG PO CAPS
5.0000 mg | ORAL_CAPSULE | Freq: Every day | ORAL | Status: DC
  Administered 2011-01-24: 5 mg via ORAL
  Filled 2011-01-23: qty 1

## 2011-01-23 MED ORDER — CARVEDILOL 6.25 MG PO TABS
6.2500 mg | ORAL_TABLET | Freq: Two times a day (BID) | ORAL | Status: DC
  Administered 2011-01-23 – 2011-01-24 (×2): 6.25 mg via ORAL
  Filled 2011-01-23 (×3): qty 1

## 2011-01-23 MED ORDER — ASPIRIN 81 MG PO CHEW
81.0000 mg | CHEWABLE_TABLET | Freq: Every day | ORAL | Status: DC
  Administered 2011-01-23 – 2011-01-24 (×2): 81 mg via ORAL
  Filled 2011-01-23 (×2): qty 1

## 2011-01-23 MED ORDER — OXYCODONE HCL 5 MG PO TABS
5.0000 mg | ORAL_TABLET | Freq: Four times a day (QID) | ORAL | Status: DC | PRN
  Administered 2011-01-23: 5 mg via ORAL
  Filled 2011-01-23: qty 1

## 2011-01-23 MED ORDER — HYDROMORPHONE HCL PF 1 MG/ML IJ SOLN: 0.2500 mg | INTRAMUSCULAR | Status: DC | PRN

## 2011-01-23 MED ORDER — ROCURONIUM BROMIDE 100 MG/10ML IV SOLN
INTRAVENOUS | Status: DC | PRN
  Administered 2011-01-23: 30 mg via INTRAVENOUS

## 2011-01-23 MED ORDER — LACTATED RINGERS IV SOLN: INTRAVENOUS | Status: DC

## 2011-01-23 MED ORDER — FENTANYL CITRATE 0.05 MG/ML IJ SOLN
INTRAMUSCULAR | Status: DC | PRN
  Administered 2011-01-23 (×2): 50 ug via INTRAVENOUS

## 2011-01-23 MED ORDER — DOCUSATE SODIUM 100 MG PO CAPS
100.0000 mg | ORAL_CAPSULE | Freq: Two times a day (BID) | ORAL | Status: DC
  Administered 2011-01-23 – 2011-01-24 (×2): 100 mg via ORAL
  Filled 2011-01-23 (×3): qty 1

## 2011-01-23 MED ORDER — SIMVASTATIN 20 MG PO TABS
20.0000 mg | ORAL_TABLET | Freq: Every day | ORAL | Status: DC
  Administered 2011-01-23: 20 mg via ORAL
  Filled 2011-01-23 (×2): qty 1

## 2011-01-23 MED ORDER — THERA M PLUS PO TABS
1.0000 | ORAL_TABLET | Freq: Every day | ORAL | Status: DC
  Administered 2011-01-23 – 2011-01-24 (×2): 1 via ORAL
  Filled 2011-01-23 (×2): qty 1

## 2011-01-23 MED ORDER — WARFARIN SODIUM 2.5 MG PO TABS
2.5000 mg | ORAL_TABLET | ORAL | Status: AC
  Administered 2011-01-23: 2.5 mg via ORAL
  Filled 2011-01-23: qty 1

## 2011-01-23 MED ORDER — ACETAMINOPHEN 10 MG/ML IV SOLN
INTRAVENOUS | Status: DC | PRN
  Administered 2011-01-23: 1000 mg via INTRAVENOUS

## 2011-01-23 MED ORDER — DEXTROSE 5 % IV SOLN
1.0000 g | INTRAVENOUS | Status: AC
  Administered 2011-01-23: 1 g via INTRAVENOUS

## 2011-01-23 MED ORDER — ONDANSETRON HCL 4 MG/2ML IJ SOLN
INTRAMUSCULAR | Status: DC | PRN
  Administered 2011-01-23 (×2): 4 mg via INTRAVENOUS

## 2011-01-23 MED ORDER — NEOSTIGMINE METHYLSULFATE 1 MG/ML IJ SOLN
INTRAMUSCULAR | Status: DC | PRN
  Administered 2011-01-23: 5 mg via INTRAVENOUS

## 2011-01-23 MED ORDER — DEXAMETHASONE SODIUM PHOSPHATE 10 MG/ML IJ SOLN
INTRAMUSCULAR | Status: DC | PRN
  Administered 2011-01-23: 10 mg via INTRAVENOUS

## 2011-01-23 MED ORDER — PROPOFOL 10 MG/ML IV BOLUS
INTRAVENOUS | Status: DC | PRN
  Administered 2011-01-23: 170 mg via INTRAVENOUS

## 2011-01-23 MED ORDER — ONE-DAILY MULTI VITAMINS PO TABS: 1.0000 | ORAL_TABLET | Freq: Every day | ORAL | Status: DC

## 2011-01-23 MED ORDER — IOHEXOL 300 MG/ML  SOLN
INTRAMUSCULAR | Status: AC
  Filled 2011-01-23: qty 1

## 2011-01-23 MED ORDER — SUCCINYLCHOLINE CHLORIDE 20 MG/ML IJ SOLN
INTRAMUSCULAR | Status: DC | PRN
  Administered 2011-01-23: 100 mg via INTRAVENOUS

## 2011-01-23 SURGICAL SUPPLY — 37 items
APPLIER CLIP 5 13 M/L LIGAMAX5 (MISCELLANEOUS) ×2
APPLIER CLIP ROT 10 11.4 M/L (STAPLE)
BENZOIN TINCTURE PRP APPL 2/3 (GAUZE/BANDAGES/DRESSINGS) IMPLANT
CANISTER SUCTION 2500CC (MISCELLANEOUS) ×2 IMPLANT
CLIP APPLIE 5 13 M/L LIGAMAX5 (MISCELLANEOUS) ×1 IMPLANT
CLIP APPLIE ROT 10 11.4 M/L (STAPLE) IMPLANT
CLOTH BEACON ORANGE TIMEOUT ST (SAFETY) ×2 IMPLANT
COVER MAYO STAND STRL (DRAPES) ×2 IMPLANT
DECANTER SPIKE VIAL GLASS SM (MISCELLANEOUS) ×2 IMPLANT
DERMABOND ADVANCED (GAUZE/BANDAGES/DRESSINGS) ×1
DERMABOND ADVANCED .7 DNX12 (GAUZE/BANDAGES/DRESSINGS) ×1 IMPLANT
DRAPE C-ARM 42X72 X-RAY (DRAPES) ×2 IMPLANT
DRAPE LAPAROSCOPIC ABDOMINAL (DRAPES) ×2 IMPLANT
ELECT REM PT RETURN 9FT ADLT (ELECTROSURGICAL) ×2
ELECTRODE REM PT RTRN 9FT ADLT (ELECTROSURGICAL) ×1 IMPLANT
GLOVE BIO SURGEON STRL SZ7 (GLOVE) ×2 IMPLANT
GLOVE BIOGEL PI IND STRL 7.5 (GLOVE) ×1 IMPLANT
GLOVE BIOGEL PI INDICATOR 7.5 (GLOVE) ×1
GOWN PREVENTION PLUS LG XLONG (DISPOSABLE) ×2 IMPLANT
GOWN PREVENTION PLUS XLARGE (GOWN DISPOSABLE) ×2 IMPLANT
GOWN STRL NON-REIN LRG LVL3 (GOWN DISPOSABLE) ×4 IMPLANT
GOWN STRL REIN XL XLG (GOWN DISPOSABLE) ×2 IMPLANT
HEMOSTAT SNOW SURGICEL 2X4 (HEMOSTASIS) ×2 IMPLANT
KIT BASIN OR (CUSTOM PROCEDURE TRAY) ×2 IMPLANT
NS IRRIG 1000ML POUR BTL (IV SOLUTION) ×2 IMPLANT
POUCH SPECIMEN RETRIEVAL 10MM (ENDOMECHANICALS) ×2 IMPLANT
SET CHOLANGIOGRAPH MIX (MISCELLANEOUS) ×2 IMPLANT
SET IRRIG TUBING LAPAROSCOPIC (IRRIGATION / IRRIGATOR) ×2 IMPLANT
SOLUTION ANTI FOG 6CC (MISCELLANEOUS) ×2 IMPLANT
STRIP CLOSURE SKIN 1/2X4 (GAUZE/BANDAGES/DRESSINGS) IMPLANT
SUT MNCRL AB 4-0 PS2 18 (SUTURE) ×2 IMPLANT
TOWEL OR 17X26 10 PK STRL BLUE (TOWEL DISPOSABLE) ×2 IMPLANT
TRAY LAP CHOLE (CUSTOM PROCEDURE TRAY) ×2 IMPLANT
TROCAR BLADELESS OPT 5 75 (ENDOMECHANICALS) ×6 IMPLANT
TROCAR XCEL BLUNT TIP 100MML (ENDOMECHANICALS) ×2 IMPLANT
TROCAR XCEL NON-BLD 11X100MML (ENDOMECHANICALS) IMPLANT
TUBING INSUFFLATION 10FT LAP (TUBING) ×2 IMPLANT

## 2011-01-23 NOTE — Transfer of Care (Signed)
Immediate Anesthesia Transfer of Care Note  Patient: [name]  Procedure(s) Performed:  LAPAROSCOPIC CHOLECYSTECTOMY WITH INTRAOPERATIVE CHOLANGIOGRAM  Patient Location: PACU  Anesthesia Type: General  Level of Consciousness: awake and sedated  Airway & Oxygen Therapy: Patient Spontanous Breathing and Patient connected to face mask oxygen  Post-op Assessment: Report given to PACU RN and Post -op Vital signs reviewed and stable  Post vital signs: Reviewed and stable  Complications: No apparent anesthesia complications

## 2011-01-23 NOTE — Progress Notes (Signed)
PHARMACIST - PHYSICIAN COMMUNICATION CONCERNING: Pharmacy Care Issues Regarding Warfarin Labs  RECOMMENDATION (Action Taken): A baseline and daily protime for three days has been ordered to meet the [employer] National Patient safety goal and comply with the current [facility] policy.   The Pharmacy will defer all warfarin dose order changes and follow up of lab results to the prescriber unless an additional order to initiate a "pharmacy Coumadin consult" is placed.  DESCRIPTION:  While hospitalized, to be in compliance with The [employer] National Patient Safety Goals, all patients on warfarin must have a baseline and/or current protime prior to the administration of warfarin. Pharmacy has received your order for warfarin without these required laboratory assessments.   

## 2011-01-23 NOTE — H&P (View-Only) (Signed)
Patient ID: [name]., male   DOB: 1926-10-04, 77 y.o.   MRN: [id number]  Chief Complaint  Patient presents with  . Other    ets pt new prob- eval GB with stones    HPI Patrick RAUCCI Sr. is a 77 y.o. male.   HPI This is an 77 year old male who I know well from his prior laparoscopic colectomy as well as an inguinal hernia repair. Since I last saw him he has had a aortic valve replacement. He has done fairly well from that and shortness of breath is now much better. He is otherwise recovering very well. 3-4 weeks ago he had the beginning of right upper quadrant pain that radiated to his back underneath the scapula needs we could not sleep. He can't really associate this with any eating ny other factors. Relief was only obtained by some of his medication as well as time. His initial episode was a nagging pain for about a week. This then resolved. Since then he has occasional right upper quadrant pain. His appetite has not really changed he has no real nausea or vomiting. His bowel movements and change in gallbladder. He reports no history of jaundice. He was seen by Dr. Stoney Bang who obtained an ultrasound showing gallstones and referred back for consideration of cholecystectomy.  Past Medical History  Diagnosis Date  . Atrial fibrillation     s/p cardioversion October 2012  . Hypertension   . AAA (abdominal aortic aneurysm)     s/p stent graft in September 2009 & with attempted embolization/occlusion of vessels for a type 2 leak around the stent graft; Managed by Dr. Darrick Penna.  . History of carotid stenosis     s/p L CEA in September 2006  . Gout   . Arthritis   . Hyperlipidemia   . Stroke 2006    three strokes   . Wears glasses   . Coronary artery disease     Remote CABG x 5 in 2000  . Aortic stenosis     s/p AVR in September 2012  . PVC's (premature ventricular contractions)   . PAT (paroxysmal atrial tachycardia)   . Inguinal hernia     RIH  . Emphysema of lung   . High  risk medication use     on amiodarone  . Chronic anticoagulation   . Gallstones   . CHF (congestive heart failure)     Past Surgical History  Procedure Date  . Tonsillectomy   . Joint replacement     Right TKR  . Abdominal aortic aneurysm repair 2008    Gore Excluder Stent Graft repair  . Carotid endarterectomy 2006    Left Side  . Hemorrhoid surgery   . Coronary artery bypass graft 1970    bypass and open heart surgery  . Replacement total knee 2008  . Abdominal aortic aneurysm repair 2010/2012  . Cardiac catheterization 04/29/1998    EF 60%  . Coronary artery bypass graft 2000    5 vessel BY DR.BARTEL. LIMA GRAFT TO THE LAD, SEQUENTIAL SAPHENOUS VEIN GRAFT TO THE  FIRST DIAGONAL AND FIRST OBTUSE MARGINAL VESSELS, SAPHENOUS VEIN GRAFT TO THE SECOND OBTUSE MARGINAL VESSEL, AND SAPHENOUS VEIN GRAFT TO THE PDA  . US echocardiography 08/18/2009    EF 55-60%  . US echocardiography 04/22/2007    EF 55-60%  . Cardiovascular stress test 08/19/2009  . Aortic valve replacement 10/24/10    AVR  #25 mm Magna Ease pericardial valve  . Hernia repair 10/13/10  RIH  . Colon surgery     lap right colon    Family History  Problem Relation Age of Onset  . Other Mother     falopian tube during pregnancy   . Heart disease Father   . Cancer Brother     liver     Social History History  Substance Use Topics  . Smoking status: Former Smoker    Types: Cigars  . Smokeless tobacco: Never Used  . Alcohol Use: 1.8 oz/week    3 Cans of beer per week    No Known Allergies  Current Outpatient Prescriptions  Medication Sig Dispense Refill  . amiodarone (PACERONE) 200 MG tablet Take 200 mg by mouth daily.       [name] aspirin 81 MG tablet Take 81 mg by mouth daily.        . carvedilol (COREG) 6.25 MG tablet Take 6.25 mg by mouth 2 (two) times daily with a meal.       . ferrous gluconate (FERGON) 324 MG tablet Take 1 tablet (324 mg total) by mouth 2 (two) times daily.  30 tablet  5  . furosemide  (LASIX) 40 MG tablet Take 1 tablet (40 mg total) by mouth daily.  30 tablet  6  . Multiple Vitamin (MULTIVITAMIN) tablet Take 1 tablet by mouth daily.        [name] oxyCODONE-acetaminophen (PERCOCET) 5-325 MG per tablet Take 1 tablet by mouth every 4 (four) hours as needed.        . potassium chloride SA (K-DUR,KLOR-CON) 20 MEQ tablet Take 1 tablet (20 mEq total) by mouth daily.  30 tablet  6  . ramipril (ALTACE) 5 MG capsule Take 1 capsule (5 mg total) by mouth daily.  30 capsule  5  . simvastatin (ZOCOR) 20 MG tablet Take 20 mg by mouth at bedtime.        [name] warfarin (COUMADIN) 5 MG tablet Take 1 tablet (5 mg total) by mouth as directed. As directed by coumadin clinic  40 tablet  3    Review of Systems Review of Systems  Constitutional: Negative for fever, chills and unexpected weight change.  HENT: Negative for hearing loss, congestion, sore throat, trouble swallowing and voice change.   Eyes: Negative for visual disturbance.  Respiratory: Negative for cough and wheezing.   Cardiovascular: Negative for chest pain, palpitations and leg swelling.  Gastrointestinal: Positive for abdominal pain. Negative for nausea, vomiting, diarrhea, constipation, blood in stool, abdominal distention, anal bleeding and rectal pain.  Genitourinary: Negative for hematuria and difficulty urinating.  Musculoskeletal: Negative for arthralgias.  Skin: Negative for rash and wound.  Neurological: Negative for seizures, syncope, weakness and headaches.  Hematological: Negative for adenopathy. Bruises/bleeds easily.  Psychiatric/Behavioral: Negative for confusion.    Blood pressure 120/58, pulse 68, temperature 97.9 F (36.6 C), temperature source Temporal, resp. rate 16, height 5\' 11"  (1.803 m), weight 161 lb 6.4 oz (73.211 kg).  Physical Exam Physical Exam  Constitutional: He appears well-developed and well-nourished.  Eyes: No scleral icterus.  Neck: Neck supple.  Cardiovascular: Normal rate and regular rhythm.    Pulmonary/Chest: Effort normal and breath sounds normal. He has no wheezes. He has no rales.  Abdominal: Soft. He exhibits no mass. There is no hepatomegaly. There is no tenderness. There is negative Murphy's sign. No hernia.    Lymphadenopathy:    He has no cervical adenopathy.    Data Reviewed *RADIOLOGY REPORT*  Clinical Data: Right upper quadrant abdominal pain, history of  gallstones, history of repair of abdominal aortic aneurysm  COMPLETE ABDOMINAL ULTRASOUND  Comparison: CT abdomen and pelvis of 11/19/2009  Findings:  Gallbladder: Echodensities are noted within the gallbladder which  are mobile and consistent with gallstones, the largest measuring 12  mm in diameter. No pain is present over the gallbladder with  compression.  Common bile duct: The common bile duct is dilated measuring 11.5  mm in diameter. There is an echogenic focus within the common bile  duct which may represent a calculus of 4 mm in diameter.  Liver: The liver has a normal echogenic pattern. No intrahepatic  ductal dilatation is seen.  IVC: Appears normal.  Pancreas: The pancreas is largely obscured by bowel gas.  Spleen: The spleen is normal measuring 8.6 cm sagittally.  Right Kidney: No hydronephrosis is seen. The right kidney  measures 9.9 cm sagittally.  Left Kidney: No hydronephrosis. The left kidney measures 10.4 cm.  There is a small cyst in the upper pole of 1.2 x 1.1 x 1.1 cm.  Abdominal aorta: The abdominal aorta is normal in caliber with  evidence of prior endograft repair of AAA.  IMPRESSION:  1. Multiple gallstones within the gallbladder.  2. Suspect gallstone within the dilated common bile duct.  3. Evidence of prior endograft repair of AAA.   Assessment    Symptomatic cholelithiasis    Plan   I do think his symptoms are referable to his gallbladder.  We discussed surgery due to symptoms, prevention of cholecystitis and prevention of choledocholithiasis and gallstone  pancreatitis. We also will discuss with Dr. Swaziland mgt of coumadin around time of surgery. I discussed the procedure in detail.  The patient was given Agricultural engineer.  We discussed the risks and benefits of a laparoscopic cholecystectomy and possible cholangiogram including, but not limited to bleeding, infection, injury to surrounding structures such as the intestine or liver, bile leak, retained gallstones, need to convert to an open procedure, prolonged diarrhea, blood clots such as  DVT, common bile duct injury, anesthesia risks, and possible need for additional procedures.  The likelihood of improvement in symptoms and return to the patient's normal status is good. We discussed the typical post-operative recovery course.        [name] 01/17/2011, 10:05 AM

## 2011-01-23 NOTE — Interval H&P Note (Signed)
History and Physical Interval Note:  01/23/2011 10:24 AM  [name]  has presented today for surgery, with the diagnosis of gallstones  The various methods of treatment have been discussed with the patient and family. After consideration of risks, benefits and other options for treatment, the patient has consented to  Procedure(s): LAPAROSCOPIC CHOLECYSTECTOMY WITH INTRAOPERATIVE CHOLANGIOGRAM as a surgical intervention .  The patients' history has been reviewed, patient examined, no change in status, stable for surgery.  I have reviewed the patients' chart and labs.  Questions were answered to the patient's satisfaction.   INR normal today.   [name]

## 2011-01-23 NOTE — Op Note (Signed)
Preoperative diagnosis: Symptomatic cholelithiasis Postoperative diagnosis: Same as above  Procedure: Laparoscopic cholecystectomy Surgeon: Dr. Harden Mo Asst.: None Anesthesia: Gen. Endotracheal Estimated blood loss: Minimal Complications: None Disposition to recovery room in stable condition Specimens: Gallbladder to pathology Sponge needle counts correct x2 at end of operation  Indications: This is an 77 year old male who I know well from a laparoscopic right colectomy as well as an inguinal hernia repair. He recently underwent an aortic valve replacement. He's done well from that postoperatively. Recently he has begun to develop postprandial right upper quadrant pain. He has an ultrasound that shows gallstones. Clinically and radiologically appears his pain is from his gallbladder. He and I discussed a laparoscopic cholecystectomy and the risks and benefits associated with that. We'll also plan on managing his Coumadin perioperatively with the help of his cardiologist Dr. Swaziland.  Procedure: After informed consent was obtained the patient was taken to the operating room. He underwent administration of 1 g of intravenous cefoxitin. Sequential compression devices were placed on his lower extremities prior to induction with anesthesia. He was then placed under general anesthesia without complication. His abdomen was then prepped and draped in the standard sterile surgical fashion. A surgical timeout was then performed.  I infiltrated quarter percent Marcaine below his prior extraction site from his colectomy. I then made a vertical incision and carried this down to the fascia. I incised this fascia with an 11 blade. Following this I grasp both sides of the fascia with Kocher clamps. I then entered into the peritoneum bluntly under direct vision. He had some adhesions cephalad to this but this was otherwise was free. There was no evidence of an entry injury. I then placed a 0 Vicryl pursestring  suture through the fascia and inserted a Hasson trocar. The abdomen was then insufflated to 15 mmHg pressure. I then inserted 3 further 5 mm trocars in the epigastrium and right side of the abdomen under direct vision after infiltration with local anesthetic without complication. He had some omentum that was adherent to his gallbladder. The gallbladder was retracted cephalad these adhesions were taken down with a combination of blunt and sharp dissection. His duodenum was also adherent to the gallbladder this was taken down with sharp dissection also. I was then able to retract his gallbladder cephalad and lateral. He had a fair amount of omental adhesions down here as well but took some time to divide. Eventually I was very clearly able to obtain the critical view of safety. His cystic artery was very adherent to his duct and it took some time to separate both of these structures. Eventually I was able to clip and divided cystic artery. His gallbladder was very thinned out indicative his disease and I did make a small rent in his gallbladder during this period I had some spillage of some bile as well as some small stones all of which I evacuated completely. I then placed clips on this hole in the gallbladder as I moved towards removing it. I was going to do a cholangiogram due to his somewhat enlarged duct on his preoperative ultrasound. He did have normal liver function tests thought preop. I thought that given the adherence of his artery as well as the scar tissue that I would not do a cholangiogram as his liver tests were normal preoperatively. I did place 3 clips proximal and one distal on the duct and divided the cystic duct and. I then removed the gallbladder from the liver bed without difficulty. This was placed  in an Endo Catch bag and removed from the umbilicus. I then irrigated this with 2 L of saline. There were no more there was no more bile and no more stones present upon completion. I did place a  piece of Surgicel  in his gallbladder bed as I will restart his Coumadin soon. Hemostasis had been observed prior to this. I then removed this trocar. I tied this down and it obliterated the defect completely. I viewed this from the epigastric incision. There was a loop of bowel that was near there but there was no evidence of entry injury.I then removed all trocars and desufflated the abdomen.  All incisions were closed with 4-0 monocryl and dermabond.  He tolerated this well, was extubated and transferred to recovery in stable condition.

## 2011-01-23 NOTE — Anesthesia Preprocedure Evaluation (Addendum)
Anesthesia Evaluation  Patient identified by MRN, date of birth, ID band Patient awake    Reviewed: Allergy & Precautions, H&P , NPO status , Patient's Chart, lab work & pertinent test results  Airway Mallampati: II TM Distance: >3 FB Neck ROM: full    Dental No notable dental hx. (+)    Pulmonary neg pulmonary ROS, COPD clear to auscultation  Pulmonary exam normal       Cardiovascular Exercise Tolerance: Good hypertension, On Medications + CAD and +CHF + dysrhythmias Atrial Fibrillation and Supra Ventricular Tachycardia regular Normal Aortic stenosis. AVR 9/12.  AAA with endo stent/graft.  CABG 2000.  Doing well w/o CP.  Cardioversion for A fib 10/12.  Has  Been NSR since with LBBB.  CHF is associated with aortic valve problems and A fib.  EF was 25% before AVR and 50% after that.   Neuro/Psych Carotid stenosis CEA 9/06 CVA, No Residual Symptoms Negative Neurological ROS  Negative Psych ROS   GI/Hepatic negative GI ROS, Neg liver ROS,   Endo/Other  Negative Endocrine ROS  Renal/GU negative Renal ROS  Genitourinary negative   Musculoskeletal   Abdominal   Peds  Hematology negative hematology ROS (+)   Anesthesia Other Findings   Reproductive/Obstetrics negative OB ROS                         Anesthesia Physical Anesthesia Plan  ASA: III  Anesthesia Plan: General   Post-op Pain Management:    Induction: Intravenous  Airway Management Planned: Oral ETT  Additional Equipment:   Intra-op Plan:   Post-operative Plan: Extubation in OR  Informed Consent: I have reviewed the patients History and Physical, chart, labs and discussed the procedure including the risks, benefits and alternatives for the proposed anesthesia with the patient or authorized representative who has indicated his/her understanding and acceptance.   Dental Advisory Given  Plan Discussed with: CRNA and  Surgeon  Anesthesia Plan Comments:         Anesthesia Quick Evaluation

## 2011-01-23 NOTE — Anesthesia Postprocedure Evaluation (Signed)
  Anesthesia Post-op Note  Patient: [name]  Procedure(s) Performed:  LAPAROSCOPIC CHOLECYSTECTOMY WITH INTRAOPERATIVE CHOLANGIOGRAM  Patient Location: PACU  Anesthesia Type: General  Level of Consciousness: awake and alert   Airway and Oxygen Therapy: Patient Spontanous Breathing  Post-op Pain: mild  Post-op Assessment: Post-op Vital signs reviewed, Patient's Cardiovascular Status Stable, Respiratory Function Stable, Patent Airway and No signs of Nausea or vomiting  Post-op Vital Signs: stable  Complications: No apparent anesthesia complications

## 2011-01-24 ENCOUNTER — Telehealth (INDEPENDENT_AMBULATORY_CARE_PROVIDER_SITE_OTHER): Payer: Self-pay

## 2011-01-24 MED ORDER — OXYCODONE-ACETAMINOPHEN 5-325 MG PO TABS: 1.0000 | ORAL_TABLET | ORAL | Status: DC | PRN

## 2011-01-24 NOTE — Progress Notes (Signed)
1 Day Post-Op  Subjective: No complaints, wants to go home today, tol diet  Objective: Vital signs in last 24 hours: Temp:  [96.5 F (35.8 C)-98.2 F (36.8 C)] 97.3 F (36.3 C) (12/11 0622) Pulse Rate:  [51-94] 64  (12/11 0622) Resp:  [12-20] 16  (12/11 0622) BP: (151-184)/(51-74) 175/74 mmHg (12/11 0622) SpO2:  [97 %-100 %] 100 % (12/11 0622) Weight:  [170 lb 13.7 oz (77.5 kg)] 170 lb 13.7 oz (77.5 kg) (12/10 1644) Last BM Date: 01/22/11  Intake/Output from previous day: 12/10 0701 - 12/11 0700 In: 2797.5 [P.O.:600; I.V.:2197.5] Out: 670 [Urine:650; Blood:20] Intake/Output this shift:    CV: rrr Pulm: cta bilaterally Ab: soft, approp tender, bs present, incisions clean mod ecchymosis at umbilicus  Lab Results:  No results found for this basename: WBC:2,HGB:2,HCT:2,PLT:2 in the last 72 hours BMET No results found for this basename: NA:2,K:2,CL:2,CO2:2,GLUCOSE:2,BUN:2,CREATININE:2,CALCIUM:2 in the last 72 hours PT/INR  Basename 01/24/11 0520 01/23/11 0855  LABPROT 14.2 14.2  INR 1.08 1.08   ABG No results found for this basename: PHART:2,PCO2:2,PO2:2,HCO3:2 in the last 72 hours  Studies/Results: No results found.  Anti-infectives: Anti-infectives     Start     Dose/Rate Route Frequency Ordered Stop   01/23/11 0830   cefOXitin (MEFOXIN) 1 g in dextrose 5 % 50 mL IVPB        1 g 100 mL/hr over 30 Minutes Intravenous 60 min pre-op 01/23/11 0824 01/23/11 1040          Assessment/Plan: s/p Procedure(s): LAPAROSCOPIC CHOLECYSTECTOMY WITH INTRAOPERATIVE CHOLANGIOGRAM Plan home today Back on coumadin   LOS: 1 day    [facility] 01/24/2011

## 2011-01-24 NOTE — Progress Notes (Signed)
Discharge instructions given to pt/spouse, verbalized understanding. Left the unit in stable condition.  

## 2011-01-24 NOTE — Discharge Summary (Signed)
Physician Discharge Summary  Patient ID: [name] MRN: [id number] DOB/AGE: 03/03/1926 77 y.o.  Admit date: 01/23/2011 Discharge date: 01/24/2011  Admission Diagnoses: Hx CHF, afib S/p avr Sx cholelithiasis Discharge Diagnoses:  Active Problems:  * No active hospital problems. *    Discharged Condition: good  Hospital Course: 17 yom well known to me after lap right colon and IH repair.  He required AVR after [facility] repair and has done well except for development of RUQ pain.  He had [place] consistent with cholelithiasis.  He was admitted and underwent lap chole through lovenox window.  He has done well and will be discharged home today on his coumadin dose.  Consults: none  Significant Diagnostic Studies:   Treatments: surgery: lap chole   Disposition: Home or Self Care  Discharge Orders    Future Appointments: Provider: Department: Dept Phone: Center:   03/21/2011 9:40 AM Lorenda Cahill [facility] [phone] [facility]   01/04/2012 1:00 PM [facility] [phone] [facility]   01/04/2012 2:15 PM Sherren Kerns, MD [facility] [phone] [facility]     Current Discharge Medication List    CONTINUE these medications which have CHANGED   Details  oxyCODONE-acetaminophen (ROXICET) 5-325 MG per tablet Take 1-2 tablets by mouth every 4 (four) hours as needed for pain. Qty: 30 tablet, Refills: 0      CONTINUE these medications which have NOT CHANGED   Details  amiodarone (PACERONE) 200 MG tablet Take 200 mg by mouth daily.     aspirin 81 MG tablet Take 81 mg by mouth every morning.     carvedilol (COREG) 6.25 MG tablet Take 6.25 mg by mouth 2 (two) times daily with a meal.     ferrous gluconate (FERGON) 324 MG tablet Take 1 tablet (324 mg total) by mouth 2 (two) times daily. Qty: 30 tablet, Refills: 5    furosemide (LASIX) 40 MG tablet Take 40 mg by mouth every morning.     Multiple Vitamin (MULTIVITAMIN) tablet Take 1 tablet by mouth daily.     potassium chloride SA (K-DUR,KLOR-CON) 20 MEQ tablet Take 1 tablet (20 mEq total) by mouth daily. Qty: 30 tablet, Refills: 6    ramipril (ALTACE) 5 MG capsule Take 5 mg by mouth every morning.     simvastatin (ZOCOR) 20 MG tablet Take 20 mg by mouth at bedtime.     warfarin (COUMADIN) 5 MG tablet Take 2.5-5 mg by mouth daily. 0.5 TABLET Monday and Friday and 1 tablet all other days.      STOP taking these medications     enoxaparin (LOVENOX) 80 MG/0.8ML SOLN injection        Follow-up Information    Follow [facility], MD in 3 weeks.   Contact information:   [employer] [address] [place] [address] [phone]          Signed: Emelia Loron 01/24/2011, 7:16 AM

## 2011-01-24 NOTE — Telephone Encounter (Signed)
Notified Christine w/Dr Jordan's office about the pt being discharged from the hospital on Coumadin today. We wanted to let them know so they can reach the pt about making an appt with the Coumadin clinic. They will contact the pt./ [facility]

## 2011-01-25 ENCOUNTER — Encounter (HOSPITAL_COMMUNITY): Payer: Self-pay | Admitting: General Surgery

## 2011-01-27 ENCOUNTER — Ambulatory Visit (INDEPENDENT_AMBULATORY_CARE_PROVIDER_SITE_OTHER): Payer: Medicare Other | Admitting: *Deleted

## 2011-01-27 DIAGNOSIS — Z952 Presence of prosthetic heart valve: Secondary | ICD-10-CM

## 2011-01-27 DIAGNOSIS — Z7901 Long term (current) use of anticoagulants: Secondary | ICD-10-CM

## 2011-01-27 DIAGNOSIS — I4891 Unspecified atrial fibrillation: Secondary | ICD-10-CM

## 2011-01-27 LAB — POCT INR: INR: 1.2

## 2011-01-31 ENCOUNTER — Other Ambulatory Visit: Payer: Self-pay | Admitting: Cardiology

## 2011-01-31 ENCOUNTER — Telehealth: Payer: Self-pay | Admitting: *Deleted

## 2011-01-31 ENCOUNTER — Other Ambulatory Visit: Payer: Medicare Other

## 2011-01-31 LAB — HEMOCCULT SLIDES (X 3 CARDS)
OCCULT 3: POSITIVE
OCCULT 4: POSITIVE
OCCULT 5: POSITIVE

## 2011-01-31 NOTE — Telephone Encounter (Signed)
Message copied by Lorayne Bender on Tue Jan 31, 2011  5:42 PM ------      Message from: [place], PETER M      Created: Tue Jan 31, 2011  5:08 PM       Stools are negative for heme.      Theron Arista [place]

## 2011-01-31 NOTE — Telephone Encounter (Signed)
Notified of hemoccult results. States he is still passing blood. Advised to see PCP or GI doctor but he states he sees enough doctors and not going to see anyone else.

## 2011-02-03 ENCOUNTER — Ambulatory Visit (INDEPENDENT_AMBULATORY_CARE_PROVIDER_SITE_OTHER): Payer: Medicare Other | Admitting: *Deleted

## 2011-02-03 ENCOUNTER — Ambulatory Visit (INDEPENDENT_AMBULATORY_CARE_PROVIDER_SITE_OTHER): Payer: Medicare Other | Admitting: General Surgery

## 2011-02-03 ENCOUNTER — Encounter (INDEPENDENT_AMBULATORY_CARE_PROVIDER_SITE_OTHER): Payer: Self-pay | Admitting: General Surgery

## 2011-02-03 VITALS — BP 108/64 | HR 68 | Temp 97.3°F | Resp 16 | Ht 71.0 in | Wt 161.8 lb

## 2011-02-03 DIAGNOSIS — I4891 Unspecified atrial fibrillation: Secondary | ICD-10-CM

## 2011-02-03 DIAGNOSIS — Z09 Encounter for follow-up examination after completed treatment for conditions other than malignant neoplasm: Secondary | ICD-10-CM

## 2011-02-03 DIAGNOSIS — Z952 Presence of prosthetic heart valve: Secondary | ICD-10-CM

## 2011-02-03 DIAGNOSIS — Z7901 Long term (current) use of anticoagulants: Secondary | ICD-10-CM

## 2011-02-03 LAB — POCT INR: INR: 1.6

## 2011-02-03 NOTE — Progress Notes (Signed)
Subjective:     Patient ID: [name], male   DOB: 1926/09/16, 77 y.o.   MRN: [id number]  HPI This is an 77 year old male who underwent very well. He recently underwent laparoscopic cholecystectomy. He has done well from that and comes in today without any significant complaints. His pathology showed chronic cholecystitis and cholelithiasis. Review of Systems     Objective:   Physical Exam Healing incisions without infection, mild ecchymoses at sites    Assessment:     S/p lap chole    Plan:         He is really doing well. I release him to full activity. I asked him to come back and see me as needed. We did discuss today and I do think it may be a number of months before he fully regains his energy as well as his appetite.

## 2011-02-17 ENCOUNTER — Telehealth: Payer: Self-pay

## 2011-02-17 ENCOUNTER — Ambulatory Visit (INDEPENDENT_AMBULATORY_CARE_PROVIDER_SITE_OTHER): Payer: Medicare Other | Admitting: *Deleted

## 2011-02-17 DIAGNOSIS — Z7901 Long term (current) use of anticoagulants: Secondary | ICD-10-CM

## 2011-02-17 DIAGNOSIS — I4891 Unspecified atrial fibrillation: Secondary | ICD-10-CM

## 2011-02-17 DIAGNOSIS — Z952 Presence of prosthetic heart valve: Secondary | ICD-10-CM

## 2011-02-17 NOTE — Telephone Encounter (Signed)
Pt was seen today in Coumadin Clinic, pt has c/o of having no energy, weakness, fatigue.  Pt states he has been feeling this way since Sept when he started taking more medications.  Pt asked me to review his medication list, went over list with pt in detail advised him of what each medication was and why he was taking.  Advised pt all medications were necessary to be taking.  Pt wanted me to send Dr Swaziland a message to see if any of these medications could be reduced in dosages to maybe make pt feel better.  Concerned quality of life is being impacted by medication regimen.  Explained to pt with CHF this is a chronic health condition and is prone to exacerbation if medication regimen is not followed appropriately, and if exacerbates pt will feel worse and may end up in hospital.  Please call pt with any further MD recommendations.  Thanks

## 2011-02-17 NOTE — Telephone Encounter (Signed)
Patient was called,states he feels bad,weak,no energy.Appointment scheduled with Norma Fredrickson NP,02/21/11/at 10:30 am.

## 2011-02-21 ENCOUNTER — Ambulatory Visit (INDEPENDENT_AMBULATORY_CARE_PROVIDER_SITE_OTHER): Payer: Medicare Other | Admitting: Nurse Practitioner

## 2011-02-21 ENCOUNTER — Encounter: Payer: Self-pay | Admitting: Nurse Practitioner

## 2011-02-21 VITALS — BP 128/62 | HR 68 | Ht 71.0 in | Wt 166.6 lb

## 2011-02-21 DIAGNOSIS — I420 Dilated cardiomyopathy: Secondary | ICD-10-CM

## 2011-02-21 DIAGNOSIS — I428 Other cardiomyopathies: Secondary | ICD-10-CM

## 2011-02-21 DIAGNOSIS — I4891 Unspecified atrial fibrillation: Secondary | ICD-10-CM

## 2011-02-21 DIAGNOSIS — I509 Heart failure, unspecified: Secondary | ICD-10-CM

## 2011-02-21 DIAGNOSIS — Z7901 Long term (current) use of anticoagulants: Secondary | ICD-10-CM

## 2011-02-21 DIAGNOSIS — I359 Nonrheumatic aortic valve disorder, unspecified: Secondary | ICD-10-CM

## 2011-02-21 LAB — CBC
HCT: 35.7 % — ABNORMAL LOW (ref 39.0–52.0)
Hemoglobin: 12.3 g/dL — ABNORMAL LOW (ref 13.0–17.0)
MCHC: 34.6 g/dL (ref 30.0–36.0)
MCV: 98.7 fl (ref 78.0–100.0)
Platelets: 147 10*3/uL — ABNORMAL LOW (ref 150.0–400.0)
RBC: 3.61 Mil/uL — ABNORMAL LOW (ref 4.22–5.81)
RDW: 16.5 % — ABNORMAL HIGH (ref 11.5–14.6)
WBC: 4.3 10*3/uL — ABNORMAL LOW (ref 4.5–10.5)

## 2011-02-21 LAB — BASIC METABOLIC PANEL
BUN: 22 mg/dL (ref 6–23)
CO2: 30 mEq/L (ref 19–32)
Calcium: 9.1 mg/dL (ref 8.4–10.5)
Chloride: 103 mEq/L (ref 96–112)
Creatinine, Ser: 1.1 mg/dL (ref 0.4–1.5)
GFR: 67.96 mL/min (ref >60.00)
Glucose, Bld: 91 mg/dL (ref 70–99)
Potassium: 4.8 mEq/L (ref 3.5–5.1)
Sodium: 138 mEq/L (ref 135–145)

## 2011-02-21 MED ORDER — FUROSEMIDE 40 MG PO TABS: ORAL_TABLET | ORAL | Status: DC

## 2011-02-21 MED ORDER — POTASSIUM CHLORIDE CRYS ER 20 MEQ PO TBCR: EXTENDED_RELEASE_TABLET | ORAL | Status: DC

## 2011-02-21 NOTE — Patient Instructions (Signed)
Take the Lasix and Potassium only as needed. Weigh every day. If your weight goes up 2 to 3 pounds overnight, take them.  Watch the salt.  We will check your labs today. We may be able to stop your iron tablet.   I will talk with Dr. Swaziland about stopping the amiodarone.  We will see you back in about 2 months.  Call the [facility] office at [phone] if you have any questions, problems or concerns.

## 2011-02-21 NOTE — Progress Notes (Signed)
[name] Date of Birth: May 31, 1926 Medical Record #[id number]  History of Present Illness: [name] is here today for a follow up visit. He is seen for Dr. [place]. He has a complex history and recent multiple medical issues. The events are as follows:  Hernia surgery in August of 2012 and did ok. Had CHF vs pneumonia at Labor Day and found to have worsening AS. Had his AVR done. Had post op atrial fib. On amiodarone and coumadin. Cardioverted at the end of October. Had follow up CT in November. His endovascular leak around his stent graft is reported to have closed. He will see Dr. Darrick Penna again in October of 2013. He did have gallstones and had to have his gallbladder out a month ago.   He comes in today. Doing ok. Still fatigued. Just getting back to the gym. No real cardiac complaints. Has multiple questions about his medicines. Feels like he has no "zip" and sometimes gets a little dizzy. Blood pressure has been ok.   Current Outpatient Prescriptions on File Prior to Visit  Medication Sig Dispense Refill  . amiodarone (PACERONE) 200 MG tablet Take 200 mg by mouth daily.       [name] aspirin 81 MG tablet Take 81 mg by mouth every morning.       . carvedilol (COREG) 6.25 MG tablet Take 6.25 mg by mouth 2 (two) times daily with a meal.       . ferrous gluconate (FERGON) 324 MG tablet Take 1 tablet (324 mg total) by mouth 2 (two) times daily.  30 tablet  5  . Multiple Vitamin (MULTIVITAMIN) tablet Take 1 tablet by mouth daily.       . ramipril (ALTACE) 5 MG capsule Take 5 mg by mouth every morning.       . simvastatin (ZOCOR) 20 MG tablet Take 20 mg by mouth at bedtime.       [name] warfarin (COUMADIN) 5 MG tablet Take 2.5-5 mg by mouth daily. 0.5 TABLET Monday and Friday and 1 tablet all other days.      [name] DISCONTD: furosemide (LASIX) 40 MG tablet Take 40 mg by mouth every morning.       [name] DISCONTD: potassium chloride SA (K-DUR,KLOR-CON) 20 MEQ tablet Take 1 tablet (20 mEq total) by mouth  daily.  30 tablet  6  . oxyCODONE-acetaminophen (ROXICET) 5-325 MG per tablet Take 1-2 tablets by mouth every 4 (four) hours as needed for pain.  30 tablet  0    No Known Allergies  Past Medical History  Diagnosis Date  . Atrial fibrillation     s/p cardioversion October 2012  . Hypertension   . AAA (abdominal aortic aneurysm)     s/p stent graft in September 2009 & with attempted embolization/occlusion of vessels for a type 2 leak around the stent graft; Managed by Dr. Darrick Penna; last CT in 2012 showing the leak had closed.   [name] History of carotid stenosis     s/p L CEA in September 2006  . Gout   . Arthritis   . Hyperlipidemia   . Wears glasses   . Coronary artery disease     Remote CABG x 5 in 2000  . Aortic stenosis     s/p AVR in September 2012 per Dr. Laneta Simmers  . PVC's (premature ventricular contractions)   . PAT (paroxysmal atrial tachycardia)   . Inguinal hernia     RIH  . Emphysema of lung   . High risk medication  use     on amiodarone  . Chronic anticoagulation   . Gallstones     s/p cholecystectomy in Dec 2012  . Stroke 2006    three strokes -no residuals  . Urinary frequency     at night  . Anemia     on iron pills  . CHF (congestive heart failure)     SEPT AND OCT 2012; follow up echo in October shows EF of 50%.     Past Surgical History  Procedure Date  . Tonsillectomy   . Joint replacement     Right TKR  . Abdominal aortic aneurysm repair 2008    Gore Excluder Stent Graft repair  . Carotid endarterectomy 2006    Left Side  . Hemorrhoid surgery   . Replacement total knee 2008  . Abdominal aortic aneurysm repair 2010/2012  . Cardiac catheterization 04/29/1998    EF 60%  . US echocardiography 08/18/2009    EF 55-60%  . US echocardiography 04/22/2007    EF 55-60%  . Cardiovascular stress test 08/19/2009  . Aortic valve replacement 10/24/10    AVR  #25 mm Magna Ease pericardial valve  . Hernia repair 10/13/10    RIH  . Colon surgery     lap right colon    . Hemorrhoid surgery   . Coronary artery bypass graft 1970    bypass and open heart surgery--pt states this is incorrect--his only cabg was in 2000  . Coronary artery bypass graft 2000    5 vessel BY DR.BARTEL. LIMA GRAFT TO THE LAD, SEQUENTIAL SAPHENOUS VEIN GRAFT TO THE  FIRST DIAGONAL AND FIRST OBTUSE MARGINAL VESSELS, SAPHENOUS VEIN GRAFT TO THE SECOND OBTUSE MARGINAL VESSEL, AND SAPHENOUS VEIN GRAFT TO THE PDA  . Eye surgery     bilateral cataract extractions  . Cholecystectomy 01/23/2011    Procedure: LAPAROSCOPIC CHOLECYSTECTOMY WITH INTRAOPERATIVE CHOLANGIOGRAM;  Surgeon: Emelia Loron, MD;  Location: [facility];  Service: General;  Laterality: N/A;    History  Smoking status  . Former Smoker  . Types: Cigars  Smokeless tobacco  . Never Used    History  Alcohol Use  . 1.8 oz/week  . 3 Cans of beer per week    Family History  Problem Relation Age of Onset  . Other Mother     falopian tube during pregnancy   . Heart disease Father   . Cancer Brother     liver     Review of Systems: The review of systems is positive for fatigue. No further blood in the stool. No recent labs. No swelling or shortness of breath. No palpitations. All other systems were reviewed and are negative.  Physical Exam: BP 128/62  Pulse 68  Ht 5\' 11"  (1.803 m)  Wt 166 lb 9.6 oz (75.569 kg)  BMI 23.24 kg/m2 Patient is very pleasant and in no acute distress. Looks stronger than the last time I saw him. Skin is warm and dry. Color is normal.  HEENT is unremarkable. Normocephalic/atraumatic. PERRL. Sclera are nonicteric. Neck is supple. No masses. No JVD. Lungs are clear. Cardiac exam shows a regular rate and rhythm. Abdomen is soft. Extremities are without edema. Gait and ROM are intact. No gross neurologic deficits noted.  LABORATORY DATA: PENDING   Assessment / Plan:

## 2011-02-21 NOTE — Assessment & Plan Note (Signed)
He is doing well from his valve surgery. I think all he needs is a little more time and I think he will continue to progress. He has had a tough course and is now recovering from gallbladder surgery. We will check his labs today. Hopefully will be able to stop the iron supplementation.

## 2011-02-21 NOTE — Assessment & Plan Note (Signed)
His EF from October following valve surgery was 45 to 50%. He is on low dose ACE and Coreg. Will continue with these medicines for now. I have changed his Lasix and potassium to just as needed. He does weigh daily. We will see him again in 2 months. Patient is agreeable to this plan and will call if any problems develop in the interim.

## 2011-02-21 NOTE — Assessment & Plan Note (Signed)
Will probably stay on long term coumadin. He has had prior strokes in the past. His coumadin flowsheet is reviewed. He has had some lows and admits to missing a few doses. We will continue to monitor. His wife is going to give his evening medicines earlier now with dinner and hopefully minimize missing his pills.

## 2011-02-21 NOTE — Assessment & Plan Note (Signed)
He is in sinus by physical exam. Remains on his amiodarone. ? If this is going to be long term. I will discuss with Dr. [place].

## 2011-03-10 ENCOUNTER — Encounter: Payer: Medicare Other | Admitting: *Deleted

## 2011-03-16 ENCOUNTER — Ambulatory Visit (INDEPENDENT_AMBULATORY_CARE_PROVIDER_SITE_OTHER): Payer: Medicare Other | Admitting: *Deleted

## 2011-03-16 DIAGNOSIS — Z952 Presence of prosthetic heart valve: Secondary | ICD-10-CM

## 2011-03-16 DIAGNOSIS — Z7901 Long term (current) use of anticoagulants: Secondary | ICD-10-CM

## 2011-03-16 DIAGNOSIS — I4891 Unspecified atrial fibrillation: Secondary | ICD-10-CM

## 2011-03-16 DIAGNOSIS — Z954 Presence of other heart-valve replacement: Secondary | ICD-10-CM

## 2011-03-21 ENCOUNTER — Ambulatory Visit (INDEPENDENT_AMBULATORY_CARE_PROVIDER_SITE_OTHER): Payer: Medicare Other | Admitting: *Deleted

## 2011-03-21 DIAGNOSIS — I4891 Unspecified atrial fibrillation: Secondary | ICD-10-CM

## 2011-03-21 LAB — TSH: TSH: 13.32 u[IU]/mL — ABNORMAL HIGH (ref 0.35–5.50)

## 2011-04-06 ENCOUNTER — Ambulatory Visit (INDEPENDENT_AMBULATORY_CARE_PROVIDER_SITE_OTHER): Payer: Medicare Other | Admitting: *Deleted

## 2011-04-06 DIAGNOSIS — I4891 Unspecified atrial fibrillation: Secondary | ICD-10-CM

## 2011-04-06 DIAGNOSIS — Z954 Presence of other heart-valve replacement: Secondary | ICD-10-CM

## 2011-04-06 DIAGNOSIS — Z7901 Long term (current) use of anticoagulants: Secondary | ICD-10-CM

## 2011-04-06 DIAGNOSIS — Z952 Presence of prosthetic heart valve: Secondary | ICD-10-CM

## 2011-04-06 LAB — POCT INR: INR: 3

## 2011-04-11 ENCOUNTER — Telehealth: Payer: Self-pay | Admitting: Cardiology

## 2011-04-11 NOTE — Telephone Encounter (Signed)
New Msg: Pt calling needing refill of amiodarone if MD wants pt to continue taking medication. If MD does want pt to continue taking medication please call RX in.   Pt also wants directions of medication verified.

## 2011-04-11 NOTE — Telephone Encounter (Signed)
Patient called no answer.LMTC. 

## 2011-04-12 MED ORDER — AMIODARONE HCL 100 MG PO TABS: 100.0000 mg | ORAL_TABLET | Freq: Every day | ORAL | Status: DC

## 2011-04-12 NOTE — Telephone Encounter (Signed)
Patient called was told spoke with Dr.Jordan he advised to decrease amiodarone to 100 mg daily.

## 2011-04-12 NOTE — Telephone Encounter (Signed)
Fu call °Patient returning your call °

## 2011-04-28 ENCOUNTER — Ambulatory Visit (INDEPENDENT_AMBULATORY_CARE_PROVIDER_SITE_OTHER): Payer: Medicare Other | Admitting: Cardiology

## 2011-04-28 ENCOUNTER — Ambulatory Visit (INDEPENDENT_AMBULATORY_CARE_PROVIDER_SITE_OTHER): Payer: Medicare Other | Admitting: Pharmacist

## 2011-04-28 ENCOUNTER — Encounter: Payer: Self-pay | Admitting: Cardiology

## 2011-04-28 VITALS — BP 130/60 | HR 68 | Ht 71.0 in | Wt 170.0 lb

## 2011-04-28 DIAGNOSIS — E785 Hyperlipidemia, unspecified: Secondary | ICD-10-CM

## 2011-04-28 DIAGNOSIS — Z952 Presence of prosthetic heart valve: Secondary | ICD-10-CM

## 2011-04-28 DIAGNOSIS — I4891 Unspecified atrial fibrillation: Secondary | ICD-10-CM

## 2011-04-28 DIAGNOSIS — Z7901 Long term (current) use of anticoagulants: Secondary | ICD-10-CM

## 2011-04-28 DIAGNOSIS — Z954 Presence of other heart-valve replacement: Secondary | ICD-10-CM

## 2011-04-28 DIAGNOSIS — I359 Nonrheumatic aortic valve disorder, unspecified: Secondary | ICD-10-CM

## 2011-04-28 DIAGNOSIS — I509 Heart failure, unspecified: Secondary | ICD-10-CM

## 2011-04-28 DIAGNOSIS — I2510 Atherosclerotic heart disease of native coronary artery without angina pectoris: Secondary | ICD-10-CM

## 2011-04-28 DIAGNOSIS — I10 Essential (primary) hypertension: Secondary | ICD-10-CM

## 2011-04-28 DIAGNOSIS — E039 Hypothyroidism, unspecified: Secondary | ICD-10-CM

## 2011-04-28 LAB — POCT INR: INR: 2.5

## 2011-04-28 MED ORDER — LEVOTHYROXINE SODIUM 50 MCG PO TABS: 50.0000 ug | ORAL_TABLET | Freq: Every day | ORAL | Status: DC

## 2011-04-28 NOTE — Assessment & Plan Note (Signed)
Status post CABG in 2000 with continued graft patency. He is asymptomatic.

## 2011-04-28 NOTE — Assessment & Plan Note (Addendum)
He is status post tissue aortic valve replacement. Valve function was normal by followup echocardiogram. Ejection fraction had improved significantly with correction of his valve obstruction.

## 2011-04-28 NOTE — Assessment & Plan Note (Signed)
His hypothyroidism is probably related to his amiodarone. We will start him on Synthroid 50 mcg per day. We have reduced his amiodarone dose. We will consider stopping his amiodarone in 3 months if he has had no further arrhythmias.

## 2011-04-28 NOTE — Assessment & Plan Note (Signed)
Blood pressure is under good control today. Continue on his current antihypertensive therapy.

## 2011-04-28 NOTE — Patient Instructions (Addendum)
Continue your current medication.  We will add synthroid 50 micrograms daily for low thyroid.  I will see you again in 3 months with blood work

## 2011-04-28 NOTE — Assessment & Plan Note (Signed)
He appears to be maintaining sinus rhythm while on amiodarone. I have reduced his dose to 100 mg per day. We may eventually be able to get him off of this medication if he is able to maintain sinus rhythm. We'll reassess on his next visit.

## 2011-04-28 NOTE — Assessment & Plan Note (Signed)
INR is therapeutic today at 2.5. 

## 2011-04-28 NOTE — Assessment & Plan Note (Signed)
He will continue on simvastatin. We will plan on checking fasting lab work on his next visit in 3 months.

## 2011-04-28 NOTE — Progress Notes (Signed)
[name] Date of Birth: 03/14/1926 Medical Record #[id number]  History of Present Illness: [name] is here today for a follow up visit.  He has a complex history and recent multiple medical issues. The events are as follows:  Hernia surgery in August of 2012. Had CHF vs pneumonia at Labor Day and found to have worsening AS. Had AVR with a tissue prosthesis. Coronary bypasses were still patent at that time. Had post op atrial fib. On amiodarone and coumadin. Cardioverted at the end of October. Had follow up CT in November. His endovascular leak around his stent graft is reported to have closed. He will see Dr. Darrick Penna again in October of 2013. He did have gallstones and had to have his gallbladder out in December. Ejection fraction was as low as 25% at the time of his valve surgery. It has since improved to 45%.  He comes in today. Doing ok. Still fatigued. He complains of easy bruisability. He has a tooth that needs extraction. He has occasional shortness of breath but this is much better than it was in the fall. He goes to the gym 4 days a week and exercises for an hour and a half. He reports that he drinks 2-4 beers a day and smokes a couple of cigars.   Current Outpatient Prescriptions on File Prior to Visit  Medication Sig Dispense Refill  . amiodarone (PACERONE) 100 MG tablet Take 1 tablet (100 mg total) by mouth daily.  30 tablet  12  . aspirin 81 MG tablet Take 81 mg by mouth every morning.       . carvedilol (COREG) 6.25 MG tablet Take 6.25 mg by mouth 2 (two) times daily with a meal.       . furosemide (LASIX) 40 MG tablet Take daily as needed if weight goes up or has swelling.  30 tablet  6  . Multiple Vitamin (MULTIVITAMIN) tablet Take 1 tablet by mouth daily.       . potassium chloride SA (K-DUR,KLOR-CON) 20 MEQ tablet Take only on days that Lasix is taken.  30 tablet  6  . ramipril (ALTACE) 5 MG capsule Take 5 mg by mouth every morning.       . simvastatin (ZOCOR) 20 MG  tablet Take 20 mg by mouth at bedtime.       [name] warfarin (COUMADIN) 5 MG tablet Take 2.5-5 mg by mouth daily. 0.5 TABLET Monday and Friday and 1 tablet all other days.      [name] oxyCODONE-acetaminophen (ROXICET) 5-325 MG per tablet Take 1-2 tablets by mouth every 4 (four) hours as needed for pain.  30 tablet  0    No Known Allergies  Past Medical History  Diagnosis Date  . Atrial fibrillation     s/p cardioversion October 2012  . Hypertension   . AAA (abdominal aortic aneurysm)     s/p stent graft in September 2009 & with attempted embolization/occlusion of vessels for a type 2 leak around the stent graft; Managed by Dr. Darrick Penna; last CT in 2012 showing the leak had closed.   [name] History of carotid stenosis     s/p L CEA in September 2006  . Gout   . Arthritis   . Hyperlipidemia   . Wears glasses   . Coronary artery disease     Remote CABG x 5 in 2000  . Aortic stenosis     s/p AVR in September 2012 per Dr. Laneta Simmers  . PVC's (premature ventricular contractions)   .  PAT (paroxysmal atrial tachycardia)   . Inguinal hernia     RIH  . Emphysema of lung   . High risk medication use     on amiodarone  . Chronic anticoagulation   . Gallstones     s/p cholecystectomy in Dec 2012  . Stroke 2006    three strokes -no residuals  . Urinary frequency     at night  . Anemia     on iron pills  . CHF (congestive heart failure)     SEPT AND OCT 2012; follow up echo in October shows EF of 50%.     Past Surgical History  Procedure Date  . Tonsillectomy   . Joint replacement     Right TKR  . Abdominal aortic aneurysm repair 2008    Gore Excluder Stent Graft repair  . Carotid endarterectomy 2006    Left Side  . Hemorrhoid surgery   . Replacement total knee 2008  . Abdominal aortic aneurysm repair 2010/2012  . Cardiac catheterization 04/29/1998    EF 60%  . US echocardiography 08/18/2009    EF 55-60%  . US echocardiography 04/22/2007    EF 55-60%  . Cardiovascular stress test 08/19/2009  .  Aortic valve replacement 10/24/10    AVR  #25 mm Magna Ease pericardial valve  . Hernia repair 10/13/10    RIH  . Colon surgery     lap right colon  . Hemorrhoid surgery   . Coronary artery bypass graft 1970    bypass and open heart surgery--pt states this is incorrect--his only cabg was in 2000  . Coronary artery bypass graft 2000    5 vessel BY DR.BARTEL. LIMA GRAFT TO THE LAD, SEQUENTIAL SAPHENOUS VEIN GRAFT TO THE  FIRST DIAGONAL AND FIRST OBTUSE MARGINAL VESSELS, SAPHENOUS VEIN GRAFT TO THE SECOND OBTUSE MARGINAL VESSEL, AND SAPHENOUS VEIN GRAFT TO THE PDA  . Eye surgery     bilateral cataract extractions  . Cholecystectomy 01/23/2011    Procedure: LAPAROSCOPIC CHOLECYSTECTOMY WITH INTRAOPERATIVE CHOLANGIOGRAM;  Surgeon: Emelia Loron, MD;  Location: [facility];  Service: General;  Laterality: N/A;    History  Smoking status  . Former Smoker  . Types: Cigars  Smokeless tobacco  . Never Used    History  Alcohol Use  . 1.8 oz/week  . 3 Cans of beer per week    Family History  Problem Relation Age of Onset  . Other Mother     falopian tube during pregnancy   . Heart disease Father   . Cancer Brother     liver     Review of Systems: The review of systems is positive for fatigue. [name] No swelling or shortness of breath. No palpitations. All other systems were reviewed and are negative.  Physical Exam: BP 130/60  Pulse 68  Ht 5\' 11"  (1.803 m)  Wt 170 lb (77.111 kg)  BMI 23.71 kg/m2 Patient is very pleasant and in no acute distress. Skin is warm and dry. Color is normal.  HEENT is unremarkable. Normocephalic/atraumatic. PERRL. Sclera are nonicteric. Neck is supple. No masses. No JVD. Lungs are clear. Cardiac exam shows a regular rate and rhythm. Abdomen is soft. Extremities are without edema. He does have multiple bruises along his forearms. Gait and ROM are intact. No gross neurologic deficits noted.  LABORATORY DATA:  We reviewed his most recent lab work which showed  elevated TSH to 13.   Assessment / Plan:

## 2011-05-29 ENCOUNTER — Ambulatory Visit (INDEPENDENT_AMBULATORY_CARE_PROVIDER_SITE_OTHER): Payer: Medicare Other | Admitting: *Deleted

## 2011-05-29 DIAGNOSIS — I4891 Unspecified atrial fibrillation: Secondary | ICD-10-CM

## 2011-05-29 DIAGNOSIS — Z952 Presence of prosthetic heart valve: Secondary | ICD-10-CM

## 2011-05-29 DIAGNOSIS — Z7901 Long term (current) use of anticoagulants: Secondary | ICD-10-CM

## 2011-06-20 ENCOUNTER — Telehealth: Payer: Self-pay

## 2011-06-20 NOTE — Telephone Encounter (Signed)
Spoke with pt, pt states he had 1 episode of nose bleeding yesterday, not uncontrollable or heavy bleeding.  Pt reports it stopped fairly quickly yesterday, and has not had any further episodes since.  Last INR on 05/29/11 2.9.  Pt has been on the same dose and in range for months.  Advised pt to continue on same dosage of Coumadin and continue to monitor for further bleeding.  Advised pt TCB for sooner appt if bleeding returns or persists.  Pt agrees.

## 2011-06-26 ENCOUNTER — Other Ambulatory Visit: Payer: Self-pay | Admitting: *Deleted

## 2011-06-26 MED ORDER — RAMIPRIL 5 MG PO CAPS: 5.0000 mg | ORAL_CAPSULE | ORAL | Status: DC

## 2011-06-28 ENCOUNTER — Other Ambulatory Visit: Payer: Self-pay | Admitting: *Deleted

## 2011-06-28 MED ORDER — WARFARIN SODIUM 5 MG PO TABS: ORAL_TABLET | ORAL | Status: DC

## 2011-07-05 ENCOUNTER — Ambulatory Visit (INDEPENDENT_AMBULATORY_CARE_PROVIDER_SITE_OTHER): Payer: Medicare Other | Admitting: Pharmacist

## 2011-07-05 DIAGNOSIS — Z7901 Long term (current) use of anticoagulants: Secondary | ICD-10-CM

## 2011-07-05 DIAGNOSIS — I4891 Unspecified atrial fibrillation: Secondary | ICD-10-CM

## 2011-07-05 DIAGNOSIS — Z952 Presence of prosthetic heart valve: Secondary | ICD-10-CM

## 2011-07-31 ENCOUNTER — Ambulatory Visit (INDEPENDENT_AMBULATORY_CARE_PROVIDER_SITE_OTHER): Payer: Medicare Other

## 2011-07-31 ENCOUNTER — Other Ambulatory Visit (INDEPENDENT_AMBULATORY_CARE_PROVIDER_SITE_OTHER): Payer: Medicare Other

## 2011-07-31 ENCOUNTER — Ambulatory Visit (INDEPENDENT_AMBULATORY_CARE_PROVIDER_SITE_OTHER): Payer: Medicare Other | Admitting: Cardiology

## 2011-07-31 ENCOUNTER — Encounter: Payer: Self-pay | Admitting: Cardiology

## 2011-07-31 VITALS — BP 146/80 | HR 72 | Ht 71.0 in | Wt 171.8 lb

## 2011-07-31 DIAGNOSIS — Z954 Presence of other heart-valve replacement: Secondary | ICD-10-CM

## 2011-07-31 DIAGNOSIS — E039 Hypothyroidism, unspecified: Secondary | ICD-10-CM

## 2011-07-31 DIAGNOSIS — I4891 Unspecified atrial fibrillation: Secondary | ICD-10-CM

## 2011-07-31 DIAGNOSIS — Z952 Presence of prosthetic heart valve: Secondary | ICD-10-CM | POA: Insufficient documentation

## 2011-07-31 DIAGNOSIS — I2510 Atherosclerotic heart disease of native coronary artery without angina pectoris: Secondary | ICD-10-CM

## 2011-07-31 DIAGNOSIS — Z7901 Long term (current) use of anticoagulants: Secondary | ICD-10-CM

## 2011-07-31 DIAGNOSIS — I509 Heart failure, unspecified: Secondary | ICD-10-CM

## 2011-07-31 LAB — BASIC METABOLIC PANEL
CO2: 30 mEq/L (ref 19–32)
Glucose, Bld: 84 mg/dL (ref 70–99)
Potassium: 4.1 mEq/L (ref 3.5–5.1)
Sodium: 138 mEq/L (ref 135–145)

## 2011-07-31 LAB — LIPID PANEL
HDL: 60.5 mg/dL (ref >39.00)
LDL Cholesterol: 74 mg/dL (ref 0–99)
Total CHOL/HDL Ratio: 2
VLDL: 12.8 mg/dL (ref 0.0–40.0)

## 2011-07-31 LAB — HEPATIC FUNCTION PANEL
ALT: 20 U/L (ref 0–53)
AST: 18 U/L (ref 0–37)
Bilirubin, Direct: 0.1 mg/dL (ref 0.0–0.3)
Total Bilirubin: 0.6 mg/dL (ref 0.3–1.2)
Total Protein: 6.6 g/dL (ref 6.0–8.3)

## 2011-07-31 NOTE — Progress Notes (Signed)
[name] Date of Birth: [birth year] Medical Record #[id number]  History of Present Illness: [name] is here today for a follow up visit.  He has a complex history. He had hernia surgery in August of 2012. Had CHF vs pneumonia on Labor Day and found to have worsening AS. Had AVR with a tissue prosthesis. Coronary bypasses were still patent at that time. Had  Pre-and postop atrial fib. On amiodarone and coumadin. Cardioverted at the end of October. Had follow up CT in November. His endovascular leak around his stent graft is reported to have closed. He will see Dr. Darrick Penna again in October of 2013. He did have gallstones and had to have his gallbladder out in December. Ejection fraction was as low as 25% at the time of his valve surgery. It has since improved to 45%.  On followup today he states he still feels like hell. He has severe arthritic pain in his knees and has had a recent cortisone injection on the left with some improvement. He reports that his breathing is doing okay. He is not aware of any palpitations. He has not had to use any Lasix and over the past month. He denies any chest pain.    Current Outpatient Prescriptions on File Prior to Visit  Medication Sig Dispense Refill  . aspirin 81 MG tablet Take 81 mg by mouth every morning.       . carvedilol (COREG) 6.25 MG tablet Take 6.25 mg by mouth 2 (two) times daily with a meal.       . furosemide (LASIX) 40 MG tablet Take daily as needed if weight goes up or has swelling.  30 tablet  6  . levothyroxine (SYNTHROID) 50 MCG tablet Take 1 tablet (50 mcg total) by mouth daily.  30 tablet  11  . Multiple Vitamin (MULTIVITAMIN) tablet Take 1 tablet by mouth daily.       . potassium chloride SA (K-DUR,KLOR-CON) 20 MEQ tablet Take only on days that Lasix is taken.  30 tablet  6  . ramipril (ALTACE) 5 MG capsule Take 1 capsule (5 mg total) by mouth every morning.  30 capsule  6  . simvastatin (ZOCOR) 20 MG tablet Take 20 mg by mouth  at bedtime.       [name] warfarin (COUMADIN) 5 MG tablet Take as directed by [facility]  40 tablet  3  . oxyCODONE-acetaminophen (ROXICET) 5-325 MG per tablet Take 1-2 tablets by mouth every 4 (four) hours as needed for pain.  30 tablet  0    No Known Allergies  Past Medical History  Diagnosis Date  . Atrial fibrillation     s/p cardioversion October 2012  . Hypertension   . AAA (abdominal aortic aneurysm)     s/p stent graft in September 2009 & with attempted embolization/occlusion of vessels for a type 2 leak around the stent graft; Managed by Dr. Darrick Penna; last CT in 2012 showing the leak had closed.   [name] History of carotid stenosis     s/p L CEA in September 2006  . Gout   . Arthritis   . Hyperlipidemia   . Wears glasses   . Coronary artery disease     Remote CABG x 5 in 2000  . Aortic stenosis     s/p AVR in September 2012 per Dr. Laneta Simmers  . PVC's (premature ventricular contractions)   . PAT (paroxysmal atrial tachycardia)   . Inguinal hernia     RIH  . Emphysema  of lung   . High risk medication use     on amiodarone  . Chronic anticoagulation   . Gallstones     s/p cholecystectomy in Dec 2012  . Stroke 2006    three strokes -no residuals  . Urinary frequency     at night  . Anemia     on iron pills  . CHF (congestive heart failure)     SEPT AND OCT 2012; follow up echo in October shows EF of 50%.     Past Surgical History  Procedure Date  . Tonsillectomy   . Joint replacement     Right TKR  . Abdominal aortic aneurysm repair 2008    Gore Excluder Stent Graft repair  . Carotid endarterectomy 2006    Left Side  . Hemorrhoid surgery   . Replacement total knee 2008  . Abdominal aortic aneurysm repair 2010/2012  . Cardiac catheterization 04/29/1998    EF 60%  . US echocardiography 08/18/2009    EF 55-60%  . US echocardiography 04/22/2007    EF 55-60%  . Cardiovascular stress test 08/19/2009  . Aortic valve replacement 10/24/10    AVR  #25 mm Magna Ease pericardial  valve  . Hernia repair 10/13/10    RIH  . Colon surgery     lap right colon  . Hemorrhoid surgery   . Coronary artery bypass graft 1970    bypass and open heart surgery--pt states this is incorrect--his only cabg was in 2000  . Coronary artery bypass graft 2000    5 vessel BY DR.BARTEL. LIMA GRAFT TO THE LAD, SEQUENTIAL SAPHENOUS VEIN GRAFT TO THE  FIRST DIAGONAL AND FIRST OBTUSE MARGINAL VESSELS, SAPHENOUS VEIN GRAFT TO THE SECOND OBTUSE MARGINAL VESSEL, AND SAPHENOUS VEIN GRAFT TO THE PDA  . Eye surgery     bilateral cataract extractions  . Cholecystectomy 01/23/2011    Procedure: LAPAROSCOPIC CHOLECYSTECTOMY WITH INTRAOPERATIVE CHOLANGIOGRAM;  Surgeon: Emelia Loron, MD;  Location: [facility];  Service: General;  Laterality: N/A;    History  Smoking status  . Former Smoker  . Types: Cigars  Smokeless tobacco  . Never Used    History  Alcohol Use  . 1.8 oz/week  . 3 Cans of beer per week    Family History  Problem Relation Age of Onset  . Other Mother     falopian tube during pregnancy   . Heart disease Father   . Cancer Brother     liver     Review of Systems: The review of systems is positive for fatigue. No swelling or shortness of breath. No palpitations. All other systems were reviewed and are negative.  Physical Exam: BP 146/80  Pulse 72  Ht 5\' 11"  (1.803 m)  Wt 77.928 kg (171 lb 12.8 oz)  BMI 23.96 kg/m2 Patient is  pleasant and in no acute distress. Skin is warm and dry. Color is normal.  HEENT is unremarkable. Normocephalic/atraumatic. PERRL. Sclera are nonicteric. Neck is supple. No masses. No JVD. Lungs are clear. Cardiac exam shows a regular rate and rhythm. His aortic valve sound is normal. There are no gallops or rub. Abdomen is soft. Extremities are without edema.  Gait and ROM are intact. No gross neurologic deficits noted.  LABORATORY DATA:     Assessment / Plan:

## 2011-07-31 NOTE — Assessment & Plan Note (Signed)
This is related to amiodarone. He is on thyroid replacement now. We will followup on a TSH today.

## 2011-07-31 NOTE — Assessment & Plan Note (Signed)
He is status post tissue aortic valve replacement in September 2012. He has made a good recovery from that surgery and has had significant improvement in his LV function. His wife is concerned that he was still on Coumadin since he had a tissue valve but explained that this was more for his atrial fibrillation.

## 2011-07-31 NOTE — Assessment & Plan Note (Signed)
He is status post CABG in 2000. His most recent cardiac catheterization demonstrated that all grafts were patent.

## 2011-07-31 NOTE — Assessment & Plan Note (Signed)
He had atrial fibrillation when he presented with his congestive heart failure and aortic valve disease. He has been on amiodarone without recurrence. We'll stop his amiodarone at this point. If he has no recurrent atrial fibrillation within the next 3-4 months may consider stopping his Coumadin as well. Amiodarone has also resulted in hypothyroidism and hopefully this will improve with cessation of drug.

## 2011-07-31 NOTE — Patient Instructions (Signed)
Stop taking amiodarone  If you are able to maintain normal rhythm we may be able to stop coumadin in the future- but not yet.  We will call with the results of your lab work today.  I will see you again in 4 months.

## 2011-08-02 ENCOUNTER — Other Ambulatory Visit: Payer: Self-pay

## 2011-08-02 ENCOUNTER — Telehealth: Payer: Self-pay

## 2011-08-02 MED ORDER — LEVOTHYROXINE SODIUM 25 MCG PO TABS: 25.0000 ug | ORAL_TABLET | Freq: Every day | ORAL | Status: DC

## 2011-08-02 NOTE — Telephone Encounter (Signed)
Patient's wife called stated patient has been having dizzy spells for 1 week and forgot to tell Dr.Jordan at his visit this week.States he gets dizzy and loses balance and has fell.States dizziness can happen either sitting down or walking.Spoke to Dr.Jordan he advised to wait about 2 more weeks since he just stopped amiodarone.Wife advised to call back and report patient's condition or call sooner if needed.

## 2011-08-03 ENCOUNTER — Other Ambulatory Visit: Payer: Self-pay | Admitting: Cardiology

## 2011-08-03 MED ORDER — SIMVASTATIN 20 MG PO TABS: 20.0000 mg | ORAL_TABLET | Freq: Every day | ORAL | Status: DC

## 2011-08-03 NOTE — Telephone Encounter (Signed)
Has only two days left

## 2011-08-28 ENCOUNTER — Ambulatory Visit (INDEPENDENT_AMBULATORY_CARE_PROVIDER_SITE_OTHER): Payer: Medicare Other | Admitting: *Deleted

## 2011-08-28 DIAGNOSIS — Z952 Presence of prosthetic heart valve: Secondary | ICD-10-CM

## 2011-08-28 DIAGNOSIS — I4891 Unspecified atrial fibrillation: Secondary | ICD-10-CM

## 2011-08-28 DIAGNOSIS — Z7901 Long term (current) use of anticoagulants: Secondary | ICD-10-CM

## 2011-08-28 DIAGNOSIS — Z954 Presence of other heart-valve replacement: Secondary | ICD-10-CM

## 2011-09-04 ENCOUNTER — Telehealth: Payer: Self-pay | Admitting: Cardiology

## 2011-09-04 NOTE — Telephone Encounter (Signed)
Dr.Daniels office called already closed will call back tomorrow 09/05/11.

## 2011-09-04 NOTE — Telephone Encounter (Signed)
Does pt need re med for dental appt?

## 2011-09-05 NOTE — Telephone Encounter (Signed)
F/u   Please return call to Dr. Garner Nash office concerning Dental care pre-Med , tt [facility] [phone]

## 2011-09-07 MED ORDER — AMOXICILLIN 500 MG PO CAPS: ORAL_CAPSULE | ORAL | Status: DC

## 2011-09-07 NOTE — Telephone Encounter (Signed)
Dr.Daniel's office called patient will need antibiotic before dental work.Patient called spoke to wife was told patient will need antibiotic before dental work.Amoxicillin 2 grams 1 hour before dental work.

## 2011-09-18 ENCOUNTER — Ambulatory Visit (INDEPENDENT_AMBULATORY_CARE_PROVIDER_SITE_OTHER): Payer: Medicare Other | Admitting: *Deleted

## 2011-09-18 DIAGNOSIS — Z952 Presence of prosthetic heart valve: Secondary | ICD-10-CM

## 2011-09-18 DIAGNOSIS — Z954 Presence of other heart-valve replacement: Secondary | ICD-10-CM

## 2011-09-18 DIAGNOSIS — Z7901 Long term (current) use of anticoagulants: Secondary | ICD-10-CM

## 2011-09-18 DIAGNOSIS — I4891 Unspecified atrial fibrillation: Secondary | ICD-10-CM

## 2011-10-09 ENCOUNTER — Telehealth: Payer: Self-pay | Admitting: Cardiology

## 2011-10-09 ENCOUNTER — Ambulatory Visit (INDEPENDENT_AMBULATORY_CARE_PROVIDER_SITE_OTHER): Payer: Medicare Other | Admitting: Pharmacist

## 2011-10-09 DIAGNOSIS — Z7901 Long term (current) use of anticoagulants: Secondary | ICD-10-CM

## 2011-10-09 DIAGNOSIS — I4891 Unspecified atrial fibrillation: Secondary | ICD-10-CM

## 2011-10-09 DIAGNOSIS — Z954 Presence of other heart-valve replacement: Secondary | ICD-10-CM

## 2011-10-09 DIAGNOSIS — Z952 Presence of prosthetic heart valve: Secondary | ICD-10-CM

## 2011-10-09 NOTE — Telephone Encounter (Signed)
Attempted to reach patient prior to his appt but unable to do so.  Will follow up with patient when they come to the office.

## 2011-10-09 NOTE — Telephone Encounter (Signed)
Pt ha coumadin appt today at 34, wife wants to talk to dr jordan's nurse before that appt, having nose bleeds, and arms are "TORE UP", please call asap

## 2011-10-17 ENCOUNTER — Telehealth: Payer: Self-pay | Admitting: Cardiology

## 2011-10-17 NOTE — Telephone Encounter (Signed)
New problem:   C/o cramping in upper right  leg. Unable to sit up .

## 2011-10-17 NOTE — Telephone Encounter (Signed)
Patient called stated he has had cramping in rt upper leg off and on for 10 days.States he is using a over the counter cream with no relief.Patient advised to call PCP.

## 2011-11-06 ENCOUNTER — Ambulatory Visit (INDEPENDENT_AMBULATORY_CARE_PROVIDER_SITE_OTHER): Payer: Medicare Other

## 2011-11-06 DIAGNOSIS — Z952 Presence of prosthetic heart valve: Secondary | ICD-10-CM

## 2011-11-06 DIAGNOSIS — I4891 Unspecified atrial fibrillation: Secondary | ICD-10-CM

## 2011-11-06 DIAGNOSIS — Z7901 Long term (current) use of anticoagulants: Secondary | ICD-10-CM

## 2011-11-06 DIAGNOSIS — Z954 Presence of other heart-valve replacement: Secondary | ICD-10-CM

## 2011-11-06 LAB — POCT INR: INR: 1.6

## 2011-11-13 ENCOUNTER — Telehealth: Payer: Self-pay | Admitting: Cardiology

## 2011-11-13 NOTE — Telephone Encounter (Signed)
OK to hold coumadin for 5 days for back injection. Does not need to be bridged.  Peter Swaziland MD, [facility]

## 2011-11-13 NOTE — Telephone Encounter (Signed)
Pt notified and faxed to Dr. Alvester Morin.

## 2011-11-13 NOTE — Telephone Encounter (Signed)
Will forward to [facility] and Dr. Swaziland.

## 2011-11-13 NOTE — Telephone Encounter (Signed)
PT HAVING BACK INJECTION, NEEDS TO BE OFF COUMADIN FOR AT LEAST 5 DAYS, NEEDS OK FAXED TO Naaman Plummer MD [phone]

## 2011-11-20 ENCOUNTER — Encounter: Payer: Self-pay | Admitting: Cardiology

## 2011-11-20 ENCOUNTER — Ambulatory Visit (INDEPENDENT_AMBULATORY_CARE_PROVIDER_SITE_OTHER): Payer: Medicare Other | Admitting: Cardiology

## 2011-11-20 ENCOUNTER — Ambulatory Visit (INDEPENDENT_AMBULATORY_CARE_PROVIDER_SITE_OTHER): Payer: Medicare Other | Admitting: *Deleted

## 2011-11-20 VITALS — BP 124/70 | HR 71 | Ht 71.0 in | Wt 176.0 lb

## 2011-11-20 DIAGNOSIS — Z952 Presence of prosthetic heart valve: Secondary | ICD-10-CM

## 2011-11-20 DIAGNOSIS — I4891 Unspecified atrial fibrillation: Secondary | ICD-10-CM

## 2011-11-20 DIAGNOSIS — I2510 Atherosclerotic heart disease of native coronary artery without angina pectoris: Secondary | ICD-10-CM

## 2011-11-20 DIAGNOSIS — Z954 Presence of other heart-valve replacement: Secondary | ICD-10-CM

## 2011-11-20 DIAGNOSIS — E039 Hypothyroidism, unspecified: Secondary | ICD-10-CM

## 2011-11-20 DIAGNOSIS — Z7901 Long term (current) use of anticoagulants: Secondary | ICD-10-CM

## 2011-11-20 LAB — BASIC METABOLIC PANEL
CO2: 29 mEq/L (ref 19–32)
Chloride: 100 mEq/L (ref 96–112)
Sodium: 136 mEq/L (ref 135–145)

## 2011-11-20 LAB — POCT INR: INR: 1.1

## 2011-11-20 LAB — TSH: TSH: 5.85 u[IU]/mL — ABNORMAL HIGH (ref 0.35–5.50)

## 2011-11-20 NOTE — Patient Instructions (Addendum)
Stay off coumadin. Continue ASA 81 mg daily  We will check your thyroid, potassium, and magnesium levels today.  I will see you again in 6 months.

## 2011-11-20 NOTE — Progress Notes (Signed)
[name] Date of Birth: 1927-02-12 Medical Record #[id number]  History of Present Illness: [name] is here today for a follow up visit.  He has a history of coronary disease and is status post CABG in 2000. Cardiac catheterization this past year showed patent grafts. He developed severe aortic stenosis and underwent aortic valve replacement with a tissue prosthesis. Prior to surgery he had congestive heart fair with ejection fraction 25%. Postoperatively his ejection fraction improved to 45%. He did have atrial fibrillation during that time and was placed on amiodarone and Coumadin. He was cardioverted in October of 2012. On his last visit in June we stopped his amiodarone.  More recently he has complained of pain in his legs. He had an injection of the knee that did not help his symptoms. He was evaluated by [name] with orthopedics. MRI demonstrated evidence of spinal stenosis. He has been referred for spinal injection which is planned for later this afternoon. He does still ride his exercise bike 3 days a week. He denies any chest pain or shortness of breath. He's had no palpitations. He denies any increase in edema. Apparently on recent MRI it was noted that his abdominal aneurysm sac measured 7 cm. According to his last CT last fall his sac measured 6.4 cm. He is scheduled for followup with [name].    Current Outpatient Prescriptions on File Prior to Visit  Medication Sig Dispense Refill  . amoxicillin (AMOXIL) 500 MG capsule Take 2 grams 1 hour before dental work.  4 capsule  1  . aspirin 81 MG tablet Take 81 mg by mouth every morning.       . carvedilol (COREG) 6.25 MG tablet Take 6.25 mg by mouth 2 (two) times daily with a meal.       . furosemide (LASIX) 40 MG tablet Take daily as needed if weight goes up or has swelling.  30 tablet  6  . levothyroxine (SYNTHROID, LEVOTHROID) 25 MCG tablet Take 1 tablet (25 mcg total) by mouth daily.  30 tablet  11  . Multiple  Vitamin (MULTIVITAMIN) tablet Take 1 tablet by mouth daily.       [name] oxyCODONE-acetaminophen (ROXICET) 5-325 MG per tablet Take 1-2 tablets by mouth every 4 (four) hours as needed for pain.  30 tablet  0  . potassium chloride SA (K-DUR,KLOR-CON) 20 MEQ tablet Take only on days that Lasix is taken.  30 tablet  6  . ramipril (ALTACE) 5 MG capsule Take 1 capsule (5 mg total) by mouth every morning.  30 capsule  6  . warfarin (COUMADIN) 5 MG tablet Take as directed by [facility]  40 tablet  3    No Known Allergies  Past Medical History  Diagnosis Date  . Atrial fibrillation     s/p cardioversion October 2012  . Hypertension   . AAA (abdominal aortic aneurysm)     s/p stent graft in September 2009 & with attempted embolization/occlusion of vessels for a type 2 leak around the stent graft; Managed by [name]; last CT in 2012 showing the leak had closed.   [name] History of carotid stenosis     s/p L CEA in September 2006  . Gout   . Arthritis   . Hyperlipidemia   . Wears glasses   . Coronary artery disease     Remote CABG x 5 in 2000  . Aortic stenosis     s/p AVR in September 2012 per [name]  .  PVC's (premature ventricular contractions)   . PAT (paroxysmal atrial tachycardia)   . Inguinal hernia     RIH  . Emphysema of lung   . High risk medication use     on amiodarone  . Chronic anticoagulation   . Gallstones     s/p cholecystectomy in Dec 2012  . Stroke 2006    three strokes -no residuals  . Urinary frequency     at night  . Anemia     on iron pills  . CHF (congestive heart failure)     SEPT AND OCT 2012; follow up echo in October shows EF of 50%.     Past Surgical History  Procedure Date  . Tonsillectomy   . Joint replacement     Right TKR  . Abdominal aortic aneurysm repair 2008    Gore Excluder Stent Graft repair  . Carotid endarterectomy 2006    Left Side  . Hemorrhoid surgery   . Replacement total knee 2008  . Abdominal aortic aneurysm repair  2010/2012  . Cardiac catheterization 04/29/1998    EF 60%  . US echocardiography 08/18/2009    EF 55-60%  . US echocardiography 04/22/2007    EF 55-60%  . Cardiovascular stress test 08/19/2009  . Aortic valve replacement 10/24/10    AVR  #25 mm Magna Ease pericardial valve  . Hernia repair 10/13/10    RIH  . Colon surgery     lap right colon  . Hemorrhoid surgery   . Coronary artery bypass graft 1970    bypass and open heart surgery--pt states this is incorrect--his only cabg was in 2000  . Coronary artery bypass graft 2000    5 vessel BY PatrickBARTEL. LIMA GRAFT TO THE LAD, SEQUENTIAL SAPHENOUS VEIN GRAFT TO THE  FIRST DIAGONAL AND FIRST OBTUSE MARGINAL VESSELS, SAPHENOUS VEIN GRAFT TO THE SECOND OBTUSE MARGINAL VESSEL, AND SAPHENOUS VEIN GRAFT TO THE PDA  . Eye surgery     bilateral cataract extractions  . Cholecystectomy 01/23/2011    Procedure: LAPAROSCOPIC CHOLECYSTECTOMY WITH INTRAOPERATIVE CHOLANGIOGRAM;  Surgeon: Emelia Loron, MD;  Location: [facility];  Service: General;  Laterality: N/A;    History  Smoking status  . Former Smoker  . Types: Cigars  Smokeless tobacco  . Never Used    History  Alcohol Use  . 1.8 oz/week  . 3 Cans of beer per week    Family History  Problem Relation Age of Onset  . Other Mother     falopian tube during pregnancy   . Heart disease Father   . Cancer Brother     liver     Review of Systems: As noted in history of present illness. All other systems were reviewed and are negative.  Physical Exam: BP 124/70  Pulse 71  Ht 5\' 11"  (1.803 m)  Wt 176 lb (79.833 kg)  BMI 24.55 kg/m2  SpO2 97% Patient is  pleasant and in no acute distress. Skin is warm and dry. Color is normal.  HEENT is unremarkable. Normocephalic/atraumatic. PERRL. Sclera are nonicteric. Neck is supple. No masses. No JVD. Lungs are clear. Cardiac exam shows a regular rate and rhythm. His aortic valve sound is normal. There are no gallops or rub. Abdomen is soft. Extremities  are without edema.  Gait and ROM are intact. No gross neurologic deficits noted.  LABORATORY DATA:  ECG demonstrates normal sinus rhythm with occasional PVC. It is left axis deviation left bundle branch block.   Assessment / Plan: 1. Aortic  stenosis status post tissue aortic valve replacement in September 2012.  2. Coronary disease status post CABG in 2000. Cardiac catheterization this past year showed patent grafts.  3. Congestive heart failure, chronic systolic. Last ejection fraction of 45%. He appears to be well compensated on combination of carvedilol, Lasix, and Altace.  4. Atrial fibrillation. He has maintained sinus rhythm off of amiodarone. He developed atrial fibrillation when he is acutely ill with congestive heart failure. Since he has had no recurrence I think it is safe at this time to stop his Coumadin. We will continue aspirin daily. If he does have any recurrence of atrial fibrillation he would need to go on lifelong anticoagulation.  5. Spinal stenosis. He is cleared from a cardiac standpoint for injection today. His Coumadin has been held and his INR was 1.1. I do not plan on resuming Coumadin at this time.  6. Abdominal aortic aneurysm. Status post stent graft with subsequent embolization for type II leak. Patient will followup with [name] next month.  7. Hypothyroidism. Probably related to amiodarone. He is only on 25 mcg daily of Synthroid. We will check a TSH today.

## 2011-11-24 ENCOUNTER — Other Ambulatory Visit: Payer: Self-pay | Admitting: *Deleted

## 2011-11-24 MED ORDER — CARVEDILOL 6.25 MG PO TABS: 6.2500 mg | ORAL_TABLET | Freq: Two times a day (BID) | ORAL | Status: DC

## 2011-11-24 NOTE — Telephone Encounter (Signed)
Fax Received. Refill Completed. Mialani Reicks Chowoe (R.M.A)   

## 2011-11-27 ENCOUNTER — Other Ambulatory Visit: Payer: Self-pay | Admitting: Cardiology

## 2011-11-27 NOTE — Telephone Encounter (Signed)
Pt needs reill on medication and I have confirmed Pharm/lg

## 2011-11-29 MED ORDER — CARVEDILOL 6.25 MG PO TABS: 6.2500 mg | ORAL_TABLET | Freq: Two times a day (BID) | ORAL | Status: DC

## 2011-11-30 ENCOUNTER — Telehealth: Payer: Self-pay

## 2011-11-30 NOTE — Telephone Encounter (Signed)
Wife called to report pt. C/o constant pain in right thigh radiating down right leg.  States has had an MRI recently that showed the aneurysm size changed from 6.4 to 7.0.  Also, relayed information that "the MRI showed there is a narrowing between the 4th and 5th lumbar vertebrae."  States pt. was treated for this with an injection per Dr. Alvester Morin.   Requesting that pt's. Appt. Be moved up from 01/04/12 with Dr. Darrick Penna to recheck his aneurysm.  Discussed with Dr. Darrick Penna.  Gave recommendation to move pt's. CTA of abd. And pelvis and office appt. To earlier date, with pt's complaints.  Wife notified.

## 2011-12-13 ENCOUNTER — Encounter: Payer: Self-pay | Admitting: Vascular Surgery

## 2011-12-14 ENCOUNTER — Ambulatory Visit
Admission: RE | Admit: 2011-12-14 | Discharge: 2011-12-14 | Disposition: A | Discharge status: Home / Self Care | Payer: Medicare Other | Source: Ambulatory Visit | Attending: Vascular Surgery | Admitting: Vascular Surgery

## 2011-12-14 ENCOUNTER — Encounter: Payer: Self-pay | Admitting: Vascular Surgery

## 2011-12-14 ENCOUNTER — Ambulatory Visit (INDEPENDENT_AMBULATORY_CARE_PROVIDER_SITE_OTHER): Payer: Medicare Other | Admitting: Vascular Surgery

## 2011-12-14 ENCOUNTER — Other Ambulatory Visit (INDEPENDENT_AMBULATORY_CARE_PROVIDER_SITE_OTHER): Payer: Medicare Other | Admitting: *Deleted

## 2011-12-14 VITALS — BP 158/86 | HR 70 | Ht 71.0 in | Wt 171.0 lb

## 2011-12-14 DIAGNOSIS — Z9889 Other specified postprocedural states: Secondary | ICD-10-CM

## 2011-12-14 DIAGNOSIS — I6529 Occlusion and stenosis of unspecified carotid artery: Secondary | ICD-10-CM

## 2011-12-14 DIAGNOSIS — Z48812 Encounter for surgical aftercare following surgery on the circulatory system: Secondary | ICD-10-CM

## 2011-12-14 MED ORDER — IOHEXOL 350 MG/ML SOLN
100.0000 mL | Freq: Once | INTRAVENOUS | Status: AC | PRN
  Administered 2011-12-14: 100 mL via INTRAVENOUS

## 2011-12-14 NOTE — Progress Notes (Signed)
[facility] [place] HISTORY AND PHYSICAL    History of Present Illness:  Patient is a 78 y.o.. year old male who presents for follow-up evaluation of AAA. He underwent Gore Excluder aneurysm stent graft repair in September of 2009. He underwent coil embolization of a type II endoleak subsequent to this for enlargement of his aneurysm. His aneurysm was 5.1 cm prior to repair He has chronic back pain and evidence of lumbar stenosis. He denies any abdominal pain. He occasionally has some cramping in the posterior aspect of his right thigh. This does not sound like claudication. The patient denies new abdominal or back pain.  The patient's atherosclerotic risk factors remain hypertension hyperlipidemia, COPD.  His cardiologist is Dr. Swaziland. These are all currently stable. He was on Coumadin in the past for a atrial fibrillation but his atrial fibrillation has now resolved.  He also has undergone previous left carotid endarterectomy and denies any symptoms of TIA amaurosis or stroke.  Past Medical History  Diagnosis Date  . Atrial fibrillation     s/p cardioversion October 2012  . Hypertension   . AAA (abdominal aortic aneurysm)     s/p stent graft in September 2009 & with attempted embolization/occlusion of vessels for a type 2 leak around the stent graft; Managed by Dr. Darrick Penna; last CT in 2012 showing the leak had closed.   [name] History of carotid stenosis     s/p L CEA in September 2006  . Gout   . Arthritis   . Hyperlipidemia   . Wears glasses   . Coronary artery disease     Remote CABG x 5 in 2000  . Aortic stenosis     s/p AVR in September 2012 per Dr. Laneta Simmers  . PVC's (premature ventricular contractions)   . PAT (paroxysmal atrial tachycardia)   . Inguinal hernia     RIH  . Emphysema of lung   . High risk medication use     on amiodarone  . Chronic anticoagulation   . Gallstones     s/p cholecystectomy in Dec 2012  . Stroke 2006    three strokes -no residuals    . Urinary frequency     at night  . Anemia     on iron pills  . CHF (congestive heart failure)     SEPT AND OCT 2012; follow up echo in October shows EF of 50%.      Past Surgical History  Procedure Date  . Tonsillectomy   . Joint replacement     Right TKR  . Abdominal aortic aneurysm repair 2008    Gore Excluder Stent Graft repair  . Carotid endarterectomy 2006    Left Side  . Hemorrhoid surgery   . Replacement total knee 2008  . Abdominal aortic aneurysm repair 2010/2012  . Cardiac catheterization 04/29/1998    EF 60%  . US echocardiography 08/18/2009    EF 55-60%  . US echocardiography 04/22/2007    EF 55-60%  . Cardiovascular stress test 08/19/2009  . Aortic valve replacement 10/24/10    AVR  #25 mm Magna Ease pericardial valve  . Hernia repair 10/13/10    RIH  . Colon surgery     lap right colon  . Hemorrhoid surgery   . Coronary artery bypass graft 1970    bypass and open heart surgery--pt states this is incorrect--his only cabg was in 2000  . Coronary artery bypass graft 2000    5 vessel BY DR.BARTEL. LIMA GRAFT  TO THE LAD, SEQUENTIAL SAPHENOUS VEIN GRAFT TO THE  FIRST DIAGONAL AND FIRST OBTUSE MARGINAL VESSELS, SAPHENOUS VEIN GRAFT TO THE SECOND OBTUSE MARGINAL VESSEL, AND SAPHENOUS VEIN GRAFT TO THE PDA  . Eye surgery     bilateral cataract extractions  . Cholecystectomy 01/23/2011    Procedure: LAPAROSCOPIC CHOLECYSTECTOMY WITH INTRAOPERATIVE CHOLANGIOGRAM;  Surgeon: Emelia Loron, MD;  Location: [facility];  Service: General;  Laterality: N/A;       Review of Systems:  Neurologic: denies symptoms of TIA, amaurosis, or stroke Cardiac:denies shortness of breath or chest pain Pulmonary: denies cough or wheeze Abdomen: denies abdominal pain nausea or vomiting  History   Social History  . Marital Status: Married    Spouse Name: N/A    Number of Children: N/A  . Years of Education: N/A   Occupational History  . Not on file.   Social History Main Topics  .  Smoking status: Current Every Day Smoker    Types: Cigars  . Smokeless tobacco: Never Used   Comment: pt states he smokes 3-4 cigars every day  . Alcohol Use: 12.6 oz/week    21 Cans of beer per week  . Drug Use: No  . Sexually Active: Not on file   Other Topics Concern  . Not on file   Social History Narrative  . No narrative on file    No Known Allergies  Current Outpatient Prescriptions on File Prior to Visit  Medication Sig Dispense Refill  . aspirin 81 MG tablet Take 81 mg by mouth every morning.       . carvedilol (COREG) 6.25 MG tablet Take 1 tablet (6.25 mg total) by mouth 2 (two) times daily with a meal.  60 tablet  6  . levothyroxine (SYNTHROID, LEVOTHROID) 25 MCG tablet Take 1 tablet (25 mcg total) by mouth daily.  30 tablet  11  . Multiple Vitamin (MULTIVITAMIN) tablet Take 1 tablet by mouth daily.       [name] oxyCODONE-acetaminophen (ROXICET) 5-325 MG per tablet Take 1-2 tablets by mouth every 4 (four) hours as needed for pain.  30 tablet  0  . ramipril (ALTACE) 5 MG capsule Take 1 capsule (5 mg total) by mouth every morning.  30 capsule  6  . amoxicillin (AMOXIL) 500 MG capsule Take 2 grams 1 hour before dental work.  4 capsule  1  . furosemide (LASIX) 40 MG tablet Take daily as needed if weight goes up or has swelling.  30 tablet  6  . magnesium oxide (MAG-OX) 400 MG tablet Take 400 mg by mouth daily.      . potassium chloride SA (K-DUR,KLOR-CON) 20 MEQ tablet Take only on days that Lasix is taken.  30 tablet  6  . warfarin (COUMADIN) 5 MG tablet Take as directed by [facility]  40 tablet  3   No current facility-administered medications on file prior to visit.       Physical Examination    Filed Vitals:   12/14/11 1249 12/14/11 1256  BP: 155/88 158/86  Pulse: 70   Height: 5\' 11"  (1.803 m)   Weight: 171 lb (77.565 kg)   SpO2: 99%      General:  Alert and oriented, no acute distress HEENT: Normal Neck: No bruit or JVD Pulmonary: Clear to auscultation  bilaterally Cardiac: Regular Rate and Rhythm without murmur Abdomen: Soft, non-tender, non-distended, normal bowel sounds, no pulsatile mass Extremities: 2+ femoral pulses, absent popliteal and pedal pulses   DATA:  CT angiogram the  abdomen and pelvis images were reviewed today. Current aneurysm diameter is  7cm.  There is no  evidence of endoleak. However there is a large amount of artifact from the previous coil embolization The top portion of the stent graft is adjacent to the renal arteries and there is no evidence of migration.   The patient had a carotid duplex scan today which I reviewed and interpreted. This shows no evidence of recurrent carotid stenosis.  ASSESSMENT:  Enlargement of abdominal aortic aneurysm to 7 cm with no obvious endoleak but images obscured by artifact  Chronic back pain with lumbar stenosis   PLAN: Patient will be scheduled for an aortogram with lower extremity runoff to further evaluate his aneurysm stent graft for endoleak. The aneurysm has grown to 7 cm. We will have to consider a repeat intervention or potentially an open repair of his aneurysm if an endovascular solution cannot be found. The patient has also requested a second opinion regarding his back. I have referred him to Dr. Nelda Severe regarding this. The patient is scheduled for his aortogram on November 11 following Dr. Adele Dan cases.  Fabienne Bruns, MD [facility] [place] Office: [phone] Pager: [phone]

## 2011-12-15 ENCOUNTER — Encounter (HOSPITAL_COMMUNITY): Payer: Self-pay | Admitting: Pharmacy Technician

## 2011-12-15 ENCOUNTER — Encounter: Payer: Self-pay | Admitting: *Deleted

## 2011-12-15 ENCOUNTER — Other Ambulatory Visit: Payer: Self-pay | Admitting: *Deleted

## 2011-12-24 MED ORDER — SODIUM CHLORIDE 0.9 % IV SOLN: INTRAVENOUS | Status: DC

## 2011-12-25 ENCOUNTER — Telehealth: Payer: Self-pay | Admitting: Vascular Surgery

## 2011-12-25 ENCOUNTER — Encounter (HOSPITAL_COMMUNITY)
Admission: RE | Disposition: A | Discharge status: Home / Self Care | Payer: Self-pay | Source: Ambulatory Visit | Attending: Vascular Surgery

## 2011-12-25 ENCOUNTER — Ambulatory Visit (HOSPITAL_COMMUNITY)
Admission: RE | Admit: 2011-12-25 | Discharge: 2011-12-25 | Disposition: A | Discharge status: Home / Self Care | Payer: Medicare Other | Source: Ambulatory Visit | Attending: Vascular Surgery | Admitting: Vascular Surgery

## 2011-12-25 DIAGNOSIS — J438 Other emphysema: Secondary | ICD-10-CM | POA: Insufficient documentation

## 2011-12-25 DIAGNOSIS — I2510 Atherosclerotic heart disease of native coronary artery without angina pectoris: Secondary | ICD-10-CM | POA: Insufficient documentation

## 2011-12-25 DIAGNOSIS — I509 Heart failure, unspecified: Secondary | ICD-10-CM | POA: Insufficient documentation

## 2011-12-25 DIAGNOSIS — I10 Essential (primary) hypertension: Secondary | ICD-10-CM | POA: Insufficient documentation

## 2011-12-25 DIAGNOSIS — I714 Abdominal aortic aneurysm, without rupture, unspecified: Secondary | ICD-10-CM | POA: Insufficient documentation

## 2011-12-25 DIAGNOSIS — Z09 Encounter for follow-up examination after completed treatment for conditions other than malignant neoplasm: Secondary | ICD-10-CM | POA: Insufficient documentation

## 2011-12-25 DIAGNOSIS — E785 Hyperlipidemia, unspecified: Secondary | ICD-10-CM | POA: Insufficient documentation

## 2011-12-25 DIAGNOSIS — Z954 Presence of other heart-valve replacement: Secondary | ICD-10-CM | POA: Insufficient documentation

## 2011-12-25 DIAGNOSIS — Z79899 Other long term (current) drug therapy: Secondary | ICD-10-CM | POA: Insufficient documentation

## 2011-12-25 DIAGNOSIS — Z9889 Other specified postprocedural states: Secondary | ICD-10-CM | POA: Insufficient documentation

## 2011-12-25 DIAGNOSIS — Z951 Presence of aortocoronary bypass graft: Secondary | ICD-10-CM | POA: Insufficient documentation

## 2011-12-25 HISTORY — PX: ABDOMINAL AORTAGRAM: SHX5454

## 2011-12-25 LAB — POCT I-STAT, CHEM 8
Calcium, Ion: 1.26 mmol/L (ref 1.13–1.30)
Hemoglobin: 13.3 g/dL (ref 13.0–17.0)
Sodium: 136 mEq/L (ref 135–145)
TCO2: 25 mmol/L (ref 0–100)

## 2011-12-25 SURGERY — ABDOMINAL AORTAGRAM
Anesthesia: LOCAL

## 2011-12-25 SURGERY — Surgical Case
Anesthesia: *Unknown

## 2011-12-25 MED ORDER — OXYCODONE HCL 5 MG PO TABS: 5.0000 mg | ORAL_TABLET | ORAL | Status: DC | PRN

## 2011-12-25 MED ORDER — FENTANYL CITRATE 0.05 MG/ML IJ SOLN
INTRAMUSCULAR | Status: AC
  Filled 2011-12-25: qty 2

## 2011-12-25 MED ORDER — METOPROLOL TARTRATE 1 MG/ML IV SOLN: 2.0000 mg | INTRAVENOUS | Status: DC | PRN

## 2011-12-25 MED ORDER — ALUM & MAG HYDROXIDE-SIMETH 200-200-20 MG/5ML PO SUSP: 15.0000 mL | ORAL | Status: DC | PRN

## 2011-12-25 MED ORDER — LIDOCAINE HCL (PF) 1 % IJ SOLN
INTRAMUSCULAR | Status: AC
  Filled 2011-12-25: qty 30

## 2011-12-25 MED ORDER — HYDRALAZINE HCL 20 MG/ML IJ SOLN: 10.0000 mg | INTRAMUSCULAR | Status: DC | PRN

## 2011-12-25 MED ORDER — LABETALOL HCL 5 MG/ML IV SOLN
10.0000 mg | INTRAVENOUS | Status: DC | PRN
Start: 2011-12-25 — End: 2011-12-25

## 2011-12-25 MED ORDER — LABETALOL HCL 5 MG/ML IV SOLN
INTRAVENOUS | Status: AC
  Filled 2011-12-25: qty 4

## 2011-12-25 MED ORDER — ONDANSETRON HCL 4 MG/2ML IJ SOLN: 4.0000 mg | Freq: Four times a day (QID) | INTRAMUSCULAR | Status: DC | PRN

## 2011-12-25 MED ORDER — ACETAMINOPHEN 325 MG RE SUPP: 325.0000 mg | RECTAL | Status: DC | PRN

## 2011-12-25 MED ORDER — PANTOPRAZOLE SODIUM 40 MG PO TBEC: 40.0000 mg | DELAYED_RELEASE_TABLET | Freq: Every day | ORAL | Status: DC

## 2011-12-25 MED ORDER — PHENOL 1.4 % MT LIQD: 1.0000 | OROMUCOSAL | Status: DC | PRN

## 2011-12-25 MED ORDER — GUAIFENESIN-DM 100-10 MG/5ML PO SYRP: 15.0000 mL | ORAL_SOLUTION | ORAL | Status: DC | PRN

## 2011-12-25 MED ORDER — MIDAZOLAM HCL 2 MG/2ML IJ SOLN
INTRAMUSCULAR | Status: AC
  Filled 2011-12-25: qty 2

## 2011-12-25 MED ORDER — ACETAMINOPHEN 325 MG PO TABS: 325.0000 mg | ORAL_TABLET | ORAL | Status: DC | PRN

## 2011-12-25 MED ORDER — MORPHINE SULFATE 10 MG/ML IJ SOLN: 2.0000 mg | INTRAMUSCULAR | Status: DC | PRN

## 2011-12-25 MED ORDER — DEXTROSE-NACL 5-0.45 % IV SOLN: INTRAVENOUS | Status: DC

## 2011-12-25 MED ORDER — HEPARIN (PORCINE) IN NACL 2-0.9 UNIT/ML-% IJ SOLN
INTRAMUSCULAR | Status: AC
  Filled 2011-12-25: qty 500

## 2011-12-25 NOTE — Interval H&P Note (Signed)
History and Physical Interval Note:  12/25/2011 11:52 AM  [name]  has presented today for surgery, with the diagnosis of PVD  The various methods of treatment have been discussed with the patient and family. After consideration of risks, benefits and other options for treatment, the patient has consented to  Procedure(s) (LRB) with comments: ABDOMINAL AORTAGRAM (N/A) as a surgical intervention .  The patient's history has been reviewed, patient examined, no change in status, stable for surgery.  I have reviewed the patient's chart and labs.  Questions were answered to the patient's satisfaction.     Skyleigh Windle,Dyshawn E

## 2011-12-25 NOTE — Op Note (Signed)
Procedure: Abdominal aortogram Preop Diagnosis: Abdominal aortic aneurysm Postop Diagnosis: Abdominal aortic aneurysm Anesthesia: Local with sedation  Operative details: After informed consent, the patient was taken to the PV lab. The patient was placed in supine position on the Angio table. Both groins were prepped and draped in the usual sterile fashion. Local anesthesia was infiltrated over the right common femoral artery. An introducer needle was used to cannulate the right common femoral artery without difficulty. An 48 Versacore wire was then threaded and the abdominal aorta under fluoroscopic guidance. The 5 French sheath was placed in the right common femoral artery over the wire and thoroughly flushed with heparinized saline. A 5 French marker pigtail catheter was then placed over the guidewire and advanced into the abdominal aorta under fluoroscopic guidance. Abdominal aortogram was obtained in an AP projection, lateral and bilateral oblique projections. The left and right renal arteries are patent. The infrarenal abdominal aorta contains an aneurysm stent graft.  There is no proximal or distal type 1 endoleak. There is some artififact along the right side of the graft from a prior embolization of a lumbar artery.  The left and right common iliac arteries are patent. The left and right external and internal iliac arteries are patent.  There was no obvious filling of the aneurysm sac except for a small blush of contrast that appeared to be fed from a small internal iliac branch.  The pigtail catheter was pulled down into the main body of the graft and and additional RAO and AP aortogram was obtained which showed retrograde filling up to the border of the aneurysm by a branch from the left and right internal iliac arteries.  These paired branches show a small blush of contrast into the aneurysm sac.  A previously embolized lumbar artery remains well excluded.  At this point the pigtail catheter was  pulled back over a guidewire.  The 5 Fr sheath was thoroughly flushed.  The patient was taken to the holding area in stable condition.  There were no complications.  The sheath will be pulled in the holding area.  Findings: Type II endoleak fed via a pair of internal iliac artery branches   Operative plan: It was discussed with the patient the possibility of embolizing these branches versus considering explanting the stent and open repair.  He will think about these options and see me in the office later this week to decide on a plan.  Fabienne Bruns, MD [facility] [place] Office: [phone] Pager: [phone]

## 2011-12-25 NOTE — Telephone Encounter (Signed)
Message copied by Fredrich Birks on Mon Dec 25, 2011  2:59 PM ------      Message from: Fabienne Bruns E      Created: Mon Dec 25, 2011  1:20 PM       Abdominal aortogram            Right groin puncture no ultrasound            He needs to see me Thursday of this week.            Please contact him with appt.            Leonette Most

## 2011-12-25 NOTE — H&P (View-Only) (Signed)
[facility] [place] HISTORY AND PHYSICAL    History of Present Illness:  Patient is a 78 y.o.. year old male who presents for follow-up evaluation of AAA. He underwent Gore Excluder aneurysm stent graft repair in September of 2009. He underwent coil embolization of a type II endoleak subsequent to this for enlargement of his aneurysm. His aneurysm was 5.1 cm prior to repair He has chronic back pain and evidence of lumbar stenosis. He denies any abdominal pain. He occasionally has some cramping in the posterior aspect of his right thigh. This does not sound like claudication. The patient denies new abdominal or back pain.  The patient's atherosclerotic risk factors remain hypertension hyperlipidemia, COPD.  His cardiologist is Dr. Jordan. These are all currently stable. He was on Coumadin in the past for a atrial fibrillation but his atrial fibrillation has now resolved.  He also has undergone previous left carotid endarterectomy and denies any symptoms of TIA amaurosis or stroke.  Past Medical History  Diagnosis Date  . Atrial fibrillation     s/p cardioversion October 2012  . Hypertension   . AAA (abdominal aortic aneurysm)     s/p stent graft in September 2009 & with attempted embolization/occlusion of vessels for a type 2 leak around the stent graft; Managed by Dr. Fields; last CT in 2012 showing the leak had closed.   . History of carotid stenosis     s/p L CEA in September 2006  . Gout   . Arthritis   . Hyperlipidemia   . Wears glasses   . Coronary artery disease     Remote CABG x 5 in 2000  . Aortic stenosis     s/p AVR in September 2012 per Dr. Bartle  . PVC's (premature ventricular contractions)   . PAT (paroxysmal atrial tachycardia)   . Inguinal hernia     RIH  . Emphysema of lung   . High risk medication use     on amiodarone  . Chronic anticoagulation   . Gallstones     s/p cholecystectomy in Dec 2012  . Stroke 2006    three strokes -no residuals    . Urinary frequency     at night  . Anemia     on iron pills  . CHF (congestive heart failure)     SEPT AND OCT 2012; follow up echo in October shows EF of 50%.      Past Surgical History  Procedure Date  . Tonsillectomy   . Joint replacement     Right TKR  . Abdominal aortic aneurysm repair 2008    Gore Excluder Stent Graft repair  . Carotid endarterectomy 2006    Left Side  . Hemorrhoid surgery   . Replacement total knee 2008  . Abdominal aortic aneurysm repair 2010/2012  . Cardiac catheterization 04/29/1998    EF 60%  . Us echocardiography 08/18/2009    EF 55-60%  . Us echocardiography 04/22/2007    EF 55-60%  . Cardiovascular stress test 08/19/2009  . Aortic valve replacement 10/24/10    AVR  #25 mm Magna Ease pericardial valve  . Hernia repair 10/13/10    RIH  . Colon surgery     lap right colon  . Hemorrhoid surgery   . Coronary artery bypass graft 1970    bypass and open heart surgery--pt states this is incorrect--his only cabg was in 2000  . Coronary artery bypass graft 2000    5 vessel BY DR.BARTEL. LIMA GRAFT   TO THE LAD, SEQUENTIAL SAPHENOUS VEIN GRAFT TO THE  FIRST DIAGONAL AND FIRST OBTUSE MARGINAL VESSELS, SAPHENOUS VEIN GRAFT TO THE SECOND OBTUSE MARGINAL VESSEL, AND SAPHENOUS VEIN GRAFT TO THE PDA  . Eye surgery     bilateral cataract extractions  . Cholecystectomy 01/23/2011    Procedure: LAPAROSCOPIC CHOLECYSTECTOMY WITH INTRAOPERATIVE CHOLANGIOGRAM;  Surgeon: Matthew Wakefield, MD;  Location: [facility];  Service: General;  Laterality: N/A;       Review of Systems:  Neurologic: denies symptoms of TIA, amaurosis, or stroke Cardiac:denies shortness of breath or chest pain Pulmonary: denies cough or wheeze Abdomen: denies abdominal pain nausea or vomiting  History   Social History  . Marital Status: Married    Spouse Name: N/A    Number of Children: N/A  . Years of Education: N/A   Occupational History  . Not on file.   Social History Main Topics  .  Smoking status: Current Every Day Smoker    Types: Cigars  . Smokeless tobacco: Never Used   Comment: pt states he smokes 3-4 cigars every day  . Alcohol Use: 12.6 oz/week    21 Cans of beer per week  . Drug Use: No  . Sexually Active: Not on file   Other Topics Concern  . Not on file   Social History Narrative  . No narrative on file    No Known Allergies  Current Outpatient Prescriptions on File Prior to Visit  Medication Sig Dispense Refill  . aspirin 81 MG tablet Take 81 mg by mouth every morning.       . carvedilol (COREG) 6.25 MG tablet Take 1 tablet (6.25 mg total) by mouth 2 (two) times daily with a meal.  60 tablet  6  . levothyroxine (SYNTHROID, LEVOTHROID) 25 MCG tablet Take 1 tablet (25 mcg total) by mouth daily.  30 tablet  11  . Multiple Vitamin (MULTIVITAMIN) tablet Take 1 tablet by mouth daily.       . oxyCODONE-acetaminophen (ROXICET) 5-325 MG per tablet Take 1-2 tablets by mouth every 4 (four) hours as needed for pain.  30 tablet  0  . ramipril (ALTACE) 5 MG capsule Take 1 capsule (5 mg total) by mouth every morning.  30 capsule  6  . amoxicillin (AMOXIL) 500 MG capsule Take 2 grams 1 hour before dental work.  4 capsule  1  . furosemide (LASIX) 40 MG tablet Take daily as needed if weight goes up or has swelling.  30 tablet  6  . magnesium oxide (MAG-OX) 400 MG tablet Take 400 mg by mouth daily.      . potassium chloride SA (K-DUR,KLOR-CON) 20 MEQ tablet Take only on days that Lasix is taken.  30 tablet  6  . warfarin (COUMADIN) 5 MG tablet Take as directed by [facility]  40 tablet  3   No current facility-administered medications on file prior to visit.       Physical Examination    Filed Vitals:   12/14/11 1249 12/14/11 1256  BP: 155/88 158/86  Pulse: 70   Height: 5' 11" (1.803 m)   Weight: 171 lb (77.565 kg)   SpO2: 99%      General:  Alert and oriented, no acute distress HEENT: Normal Neck: No bruit or JVD Pulmonary: Clear to auscultation  bilaterally Cardiac: Regular Rate and Rhythm without murmur Abdomen: Soft, non-tender, non-distended, normal bowel sounds, no pulsatile mass Extremities: 2+ femoral pulses, absent popliteal and pedal pulses   DATA:  CT angiogram the   abdomen and pelvis images were reviewed today. Current aneurysm diameter is  7cm.  There is no  evidence of endoleak. However there is a large amount of artifact from the previous coil embolization The top portion of the stent graft is adjacent to the renal arteries and there is no evidence of migration.   The patient had a carotid duplex scan today which I reviewed and interpreted. This shows no evidence of recurrent carotid stenosis.  ASSESSMENT:  Enlargement of abdominal aortic aneurysm to 7 cm with no obvious endoleak but images obscured by artifact  Chronic back pain with lumbar stenosis   PLAN: Patient will be scheduled for an aortogram with lower extremity runoff to further evaluate his aneurysm stent graft for endoleak. The aneurysm has grown to 7 cm. We will have to consider a repeat intervention or potentially an open repair of his aneurysm if an endovascular solution cannot be found. The patient has also requested a second opinion regarding his back. I have referred him to Dr. Michael Tooke regarding this. The patient is scheduled for his aortogram on November 11 following Dr. Dickson's cases.  Khaleed Fields, MD [facility] [place] Office: [phone] Pager: [phone]         

## 2011-12-25 NOTE — Progress Notes (Signed)
Up and walked and tol well; right groin stable,no bleeding or hematoma 

## 2011-12-25 NOTE — Telephone Encounter (Signed)
Spoke with wife to schedule appt, she will make patient aware, dpm

## 2011-12-27 ENCOUNTER — Encounter: Payer: Self-pay | Admitting: Vascular Surgery

## 2011-12-28 ENCOUNTER — Encounter: Payer: Self-pay | Admitting: Vascular Surgery

## 2011-12-28 ENCOUNTER — Ambulatory Visit (INDEPENDENT_AMBULATORY_CARE_PROVIDER_SITE_OTHER): Payer: Medicare Other | Admitting: Vascular Surgery

## 2011-12-28 VITALS — BP 169/61 | HR 68 | Ht 71.0 in | Wt 171.6 lb

## 2011-12-28 DIAGNOSIS — I714 Abdominal aortic aneurysm, without rupture: Secondary | ICD-10-CM

## 2011-12-28 NOTE — Progress Notes (Signed)
History of Present Illness: Patient is a 78 y.o.. year old male who presents for follow-up evaluation of AAA. He underwent Gore Excluder aneurysm stent graft repair in September of 2009. He underwent coil embolization of a type II endoleak subsequent to this for enlargement of his aneurysm. His aneurysm was 5.1 cm prior to repair He has chronic back pain and evidence of lumbar stenosis. He denies any abdominal pain. He occasionally has some cramping in the posterior aspect of his right thigh. This does not sound like claudication. The patient denies new abdominal or back pain. The patient's atherosclerotic risk factors remain hypertension hyperlipidemia, COPD. His cardiologist is Dr. Swaziland. These are all currently stable. He was on Coumadin in the past for a atrial fibrillation but his atrial fibrillation has now resolved.  He recently underwent abdominal aortogram which showed an additional type II endoleak with filling of the aneurysm sac via 2 paired lumbar arteries each of these originated from an ACE ascending branch of the internal iliac artery bilaterally. The patient returns today for further followup and consideration for decisions regarding his aneurysm.  He recently was evaluated by Dr. Alveda Reasons for lumbar stenosis and conservative management was recommended  Review of systems: He still has cramping in his right posterior thigh usually in the early morning not with ambulation. He denies chest pain. He denies shortness of breath.   Physical exam: Filed Vitals:   12/28/11 0936  BP: 169/61  Pulse: 68  Height: 5\' 11"  (1.803 m)  Weight: 171 lb 9.6 oz (77.837 kg)  SpO2: 100%   abdomen: Soft nontender Extremities: 2+ femoral and popliteal pulses bilaterally, absent pedal pulses bilaterally  Assessment: Had a lengthy discussion with the patient today regarding possible treatment options for his enlarging aortic aneurysm. I discussed with him that an aneurysm greater than 7 cm has a 20% annual risk  of rupture. I also informed them that if the aneurysm ruptures 90% of people do not make it to the hospital. Of the 10% that make it to the hospital 50% survive.  I gave him possible treatment options of additional coil embolization of these 2 lumbar branches or the other option of explantation of the stent graft an open aneurysm repair. At this point the patient has decided he does not wish any further intervention on his aneurysm. He understands that he can change his mind on this if he wishes. However he does understand that if he comes in with the aneurysm ruptured his chances of survival are low.  He also expressed that he did not want to be kept alive by any artificial means such as ventilator or hemodialysis long-term. I also discussed with the patient and his wife that they should make sure all of his affairs are in order in the event that he comes in the emergency room with a ruptured aneurysm. At this point he wishes for comfort care only if he does have a ruptured aneurysm. He will followup with me on as-needed basis. The patient was given a prescription today for, #3 to use intermittently for his right thigh pain if necessary.  Fabienne Bruns, MD [facility] [place] Office: [phone] Pager: [phone]

## 2012-01-04 ENCOUNTER — Ambulatory Visit: Payer: Medicare Other | Admitting: Vascular Surgery

## 2012-01-04 ENCOUNTER — Other Ambulatory Visit: Payer: Medicare Other

## 2012-01-04 ENCOUNTER — Other Ambulatory Visit: Payer: Self-pay

## 2012-01-04 DIAGNOSIS — M549 Dorsalgia, unspecified: Secondary | ICD-10-CM

## 2012-01-04 DIAGNOSIS — M79609 Pain in unspecified limb: Secondary | ICD-10-CM

## 2012-01-04 NOTE — Progress Notes (Signed)
Pt's. wife called to request a referral to Neurology for leg pain.  Wife stated pt has "unbearable pain in his legs", and that he has a "cramp" in the back of his right thigh that is worse than other areas.   States recently saw Dr. Darrick Penna and was referred to Dr. Alveda Reasons.  Wife states "Dr. Alveda Reasons said the pain was not related to lumbar stenosis, but could be related to a nerve in his neck".  States that Dr. Alveda Reasons was unable to help [place].  Discussed w/ Dr. Darrick Penna.  Recommended a referral to Dr. Pearlean Brownie, or Dr. Sandria Manly at [facility].

## 2012-01-30 ENCOUNTER — Telehealth: Payer: Self-pay | Admitting: Cardiology

## 2012-01-30 NOTE — Telephone Encounter (Signed)
Patient's wife called no answer.LMTC. 

## 2012-01-30 NOTE — Telephone Encounter (Signed)
New problem:    1. Should he continue or discontinue medication ramipril .    2. [facility] [address] [phone]

## 2012-01-31 ENCOUNTER — Other Ambulatory Visit: Payer: Self-pay | Admitting: Cardiology

## 2012-01-31 MED ORDER — RAMIPRIL 5 MG PO CAPS: 5.0000 mg | ORAL_CAPSULE | ORAL | Status: DC

## 2012-01-31 NOTE — Telephone Encounter (Signed)
Spoke to patient's wife she stated husband needs refill on ramipril.Prescription sent to [facility] [address].

## 2012-01-31 NOTE — Telephone Encounter (Signed)
Pt rtn call because she missed call

## 2012-02-08 ENCOUNTER — Ambulatory Visit: Payer: Medicare Other | Admitting: Physical Therapy

## 2012-02-12 ENCOUNTER — Ambulatory Visit: Payer: Medicare Other | Admitting: Physical Therapy

## 2012-02-13 ENCOUNTER — Ambulatory Visit: Payer: Medicare Other | Attending: Neurology | Admitting: Rehabilitative and Restorative Service Providers"

## 2012-02-13 DIAGNOSIS — IMO0001 Reserved for inherently not codable concepts without codable children: Secondary | ICD-10-CM | POA: Insufficient documentation

## 2012-02-13 DIAGNOSIS — R269 Unspecified abnormalities of gait and mobility: Secondary | ICD-10-CM | POA: Insufficient documentation

## 2012-02-13 DIAGNOSIS — R5381 Other malaise: Secondary | ICD-10-CM | POA: Insufficient documentation

## 2012-02-21 ENCOUNTER — Ambulatory Visit: Payer: Medicare Other | Admitting: Rehabilitative and Restorative Service Providers"

## 2012-02-23 ENCOUNTER — Ambulatory Visit: Payer: Medicare Other | Attending: Neurology | Admitting: Rehabilitative and Restorative Service Providers"

## 2012-02-23 DIAGNOSIS — R269 Unspecified abnormalities of gait and mobility: Secondary | ICD-10-CM | POA: Insufficient documentation

## 2012-02-23 DIAGNOSIS — R5381 Other malaise: Secondary | ICD-10-CM | POA: Insufficient documentation

## 2012-02-23 DIAGNOSIS — IMO0001 Reserved for inherently not codable concepts without codable children: Secondary | ICD-10-CM | POA: Insufficient documentation

## 2012-02-26 ENCOUNTER — Other Ambulatory Visit: Payer: Self-pay | Admitting: Vascular Surgery

## 2012-02-26 ENCOUNTER — Ambulatory Visit: Payer: Medicare Other | Admitting: Rehabilitative and Restorative Service Providers"

## 2012-02-28 ENCOUNTER — Ambulatory Visit: Payer: Medicare Other | Admitting: Rehabilitative and Restorative Service Providers"

## 2012-02-28 ENCOUNTER — Other Ambulatory Visit: Payer: Self-pay | Admitting: Vascular Surgery

## 2012-03-04 ENCOUNTER — Ambulatory Visit: Payer: Medicare Other | Admitting: Rehabilitative and Restorative Service Providers"

## 2012-03-07 ENCOUNTER — Ambulatory Visit: Payer: Medicare Other | Admitting: Rehabilitative and Restorative Service Providers"

## 2012-03-26 ENCOUNTER — Telehealth: Payer: Self-pay | Admitting: Cardiology

## 2012-03-26 NOTE — Telephone Encounter (Signed)
New Problem   Pt has history of Afib, wants to know if it is safe to use an over the counter tens unit for pain. Would like to speak to nurse.

## 2012-03-26 NOTE — Telephone Encounter (Signed)
Spoke with pt's wife. Pt has leg pain/cramps and has been seen by several doctors for this.  Daughter has purchased an over the counter TENS unit for pt and wife would like to know if OK with Dr. Swaz[place]and for pt to use this.

## 2012-03-26 NOTE — Telephone Encounter (Signed)
I don't think a TENS unit for the lower back or leg area would have any effect on his rhythm.  Peter Swaziland MD, [facility]

## 2012-04-01 ENCOUNTER — Telehealth: Payer: Self-pay | Admitting: Cardiology

## 2012-04-01 NOTE — Telephone Encounter (Signed)
New problem    Can patient  Use over the counter - tens unit.

## 2012-04-01 NOTE — Telephone Encounter (Signed)
Pt's wife is aware  That the TENS for lower back or  leg area would not effect on pt's rhythm. Pt's wife verbalized understanding.

## 2012-04-04 NOTE — Telephone Encounter (Signed)
Spoke with patient's wife was told spoke to Dr.Jordan he advised ok to use tens unit for rt thigh.

## 2012-05-03 ENCOUNTER — Telehealth: Payer: Self-pay | Admitting: *Deleted

## 2012-05-03 ENCOUNTER — Telehealth: Payer: Self-pay | Admitting: Neurology

## 2012-05-03 NOTE — Telephone Encounter (Signed)
Patient is calling back because he wants an appointment which was made. Also he wants to be put on gabapentin to help with his leg pain. His current medication he is taking is Quinine  325 once every 8 hours, patient insurance would not cover the medication so he asked for some thing new. Which was replaced with magnesium 200mg  prn. Patient wife state her daughter was a Engineer, civil (consulting) and was advising her about the gabapentin .please advise

## 2012-05-03 NOTE — Telephone Encounter (Signed)
Patient is called he stating he has a lot of leg pain, and wants something for pain. Please advise

## 2012-05-06 NOTE — Telephone Encounter (Signed)
Ok to stop quinine and try gabapentin 300 mg as needed three times daily

## 2012-05-07 ENCOUNTER — Other Ambulatory Visit: Payer: Self-pay | Admitting: Neurology

## 2012-05-07 DIAGNOSIS — M25561 Pain in right knee: Secondary | ICD-10-CM

## 2012-05-07 MED ORDER — GABAPENTIN 300 MG PO CAPS: 300.0000 mg | ORAL_CAPSULE | Freq: Three times a day (TID) | ORAL | Status: DC

## 2012-05-08 NOTE — Telephone Encounter (Signed)
Called patient and let him know to stop the quinine and start gabapentin 3 times daily as need.

## 2012-05-10 ENCOUNTER — Ambulatory Visit: Payer: Self-pay | Admitting: Pharmacist

## 2012-05-10 DIAGNOSIS — I4891 Unspecified atrial fibrillation: Secondary | ICD-10-CM

## 2012-05-10 DIAGNOSIS — Z7901 Long term (current) use of anticoagulants: Secondary | ICD-10-CM

## 2012-05-16 ENCOUNTER — Encounter (HOSPITAL_COMMUNITY): Payer: Self-pay | Admitting: Cardiology

## 2012-05-16 ENCOUNTER — Emergency Department (HOSPITAL_COMMUNITY): Payer: Medicare Other

## 2012-05-16 ENCOUNTER — Emergency Department (HOSPITAL_COMMUNITY)
Admission: EM | Admit: 2012-05-16 | Discharge: 2012-05-16 | Disposition: A | Discharge status: Home / Self Care | Payer: Medicare Other | Attending: Emergency Medicine | Admitting: Emergency Medicine

## 2012-05-16 DIAGNOSIS — Z79899 Other long term (current) drug therapy: Secondary | ICD-10-CM | POA: Insufficient documentation

## 2012-05-16 DIAGNOSIS — Z862 Personal history of diseases of the blood and blood-forming organs and certain disorders involving the immune mechanism: Secondary | ICD-10-CM | POA: Insufficient documentation

## 2012-05-16 DIAGNOSIS — K6289 Other specified diseases of anus and rectum: Secondary | ICD-10-CM | POA: Insufficient documentation

## 2012-05-16 DIAGNOSIS — Z9889 Other specified postprocedural states: Secondary | ICD-10-CM | POA: Insufficient documentation

## 2012-05-16 DIAGNOSIS — Z789 Other specified health status: Secondary | ICD-10-CM | POA: Insufficient documentation

## 2012-05-16 DIAGNOSIS — Z8679 Personal history of other diseases of the circulatory system: Secondary | ICD-10-CM | POA: Insufficient documentation

## 2012-05-16 DIAGNOSIS — Z7901 Long term (current) use of anticoagulants: Secondary | ICD-10-CM | POA: Insufficient documentation

## 2012-05-16 DIAGNOSIS — I2510 Atherosclerotic heart disease of native coronary artery without angina pectoris: Secondary | ICD-10-CM | POA: Insufficient documentation

## 2012-05-16 DIAGNOSIS — F17200 Nicotine dependence, unspecified, uncomplicated: Secondary | ICD-10-CM | POA: Insufficient documentation

## 2012-05-16 DIAGNOSIS — Z8639 Personal history of other endocrine, nutritional and metabolic disease: Secondary | ICD-10-CM | POA: Insufficient documentation

## 2012-05-16 DIAGNOSIS — I10 Essential (primary) hypertension: Secondary | ICD-10-CM | POA: Insufficient documentation

## 2012-05-16 DIAGNOSIS — K5900 Constipation, unspecified: Secondary | ICD-10-CM | POA: Insufficient documentation

## 2012-05-16 DIAGNOSIS — Z8673 Personal history of transient ischemic attack (TIA), and cerebral infarction without residual deficits: Secondary | ICD-10-CM | POA: Insufficient documentation

## 2012-05-16 DIAGNOSIS — Z8709 Personal history of other diseases of the respiratory system: Secondary | ICD-10-CM | POA: Insufficient documentation

## 2012-05-16 DIAGNOSIS — Z87448 Personal history of other diseases of urinary system: Secondary | ICD-10-CM | POA: Insufficient documentation

## 2012-05-16 DIAGNOSIS — Z8719 Personal history of other diseases of the digestive system: Secondary | ICD-10-CM | POA: Insufficient documentation

## 2012-05-16 DIAGNOSIS — Z9089 Acquired absence of other organs: Secondary | ICD-10-CM | POA: Insufficient documentation

## 2012-05-16 DIAGNOSIS — D649 Anemia, unspecified: Secondary | ICD-10-CM | POA: Insufficient documentation

## 2012-05-16 DIAGNOSIS — I509 Heart failure, unspecified: Secondary | ICD-10-CM | POA: Insufficient documentation

## 2012-05-16 DIAGNOSIS — Z7982 Long term (current) use of aspirin: Secondary | ICD-10-CM | POA: Insufficient documentation

## 2012-05-16 LAB — CBC WITH DIFFERENTIAL/PLATELET
Basophils Absolute: 0 10*3/uL (ref 0.0–0.1)
Basophils Relative: 1 % (ref 0–1)
Eosinophils Absolute: 0.2 10*3/uL (ref 0.0–0.7)
Eosinophils Relative: 3 % (ref 0–5)
Lymphocytes Relative: 20 % (ref 12–46)
MCHC: 35.2 g/dL (ref 30.0–36.0)
MCV: 95.2 fL (ref 78.0–100.0)
Platelets: 141 10*3/uL — ABNORMAL LOW (ref 150–400)
RDW: 13.3 % (ref 11.5–15.5)
WBC: 6.1 10*3/uL (ref 4.0–10.5)

## 2012-05-16 LAB — BASIC METABOLIC PANEL
CO2: 26 mEq/L (ref 19–32)
Calcium: 10.1 mg/dL (ref 8.4–10.5)
GFR calc Af Amer: 66 mL/min — ABNORMAL LOW (ref >90)
GFR calc non Af Amer: 57 mL/min — ABNORMAL LOW (ref >90)
Sodium: 136 mEq/L (ref 135–145)

## 2012-05-16 LAB — POCT I-STAT TROPONIN I: Troponin i, poc: 0 ng/mL (ref 0.00–0.08)

## 2012-05-16 LAB — LIPASE, BLOOD: Lipase: 16 U/L (ref 11–59)

## 2012-05-16 LAB — OCCULT BLOOD, POC DEVICE: Fecal Occult Bld: POSITIVE — AB

## 2012-05-16 MED ORDER — DISPOSABLE ENEMA 19-7 GM/118ML RE ENEM: 1.0000 | ENEMA | Freq: Once | RECTAL | Status: DC

## 2012-05-16 MED ORDER — HYDROMORPHONE HCL PF 1 MG/ML IJ SOLN
INTRAMUSCULAR | Status: AC
  Filled 2012-05-16: qty 1

## 2012-05-16 MED ORDER — HYDROMORPHONE HCL PF 1 MG/ML IJ SOLN
1.0000 mg | Freq: Once | INTRAMUSCULAR | Status: AC
  Administered 2012-05-16: 1 mg via INTRAMUSCULAR

## 2012-05-16 MED ORDER — PROMETHAZINE HCL 25 MG/ML IJ SOLN
12.5000 mg | Freq: Once | INTRAMUSCULAR | Status: AC
  Administered 2012-05-16: 12.5 mg via INTRAVENOUS
  Filled 2012-05-16: qty 1

## 2012-05-16 MED ORDER — PEG 3350-KCL-NABCB-NACL-NASULF 236 G PO SOLR: 240.0000 mL | ORAL | Status: DC

## 2012-05-16 NOTE — ED Notes (Signed)
Pt was placed on pain medication and nausea medication by his PCP. States he was placed on this medication for hip pain. Family states he has been disoriented and confused since being placed on the medication. States they called the PCP back and was told to cut the pain pill in half. Reports constipation from the pain medication. Pt reports he feels like he cant have a BM. States history of hemorrhoids at in the past.

## 2012-05-16 NOTE — ED Notes (Signed)
Pt. desat down to 79% room air.  Replaced sensor in new location due to fingers being cold and placed pt. on 2L of oxygen.

## 2012-05-16 NOTE — ED Provider Notes (Signed)
History     CSN: [id number]  Arrival date & time 05/16/12  1040   First MD Initiated Contact with Patient 05/16/12 1110      Chief Complaint  Patient presents with  . Abdominal Pain    (Consider location/radiation/quality/duration/timing/severity/associated sxs/prior treatment) HPI Comments: Patient comes to the ER for evaluation of constipation. Patient has not had a bowel movement in 5 days. Patient was recently started on methadone by his primary Dr. for chronic back pain. Patient had been complaining of constipation yesterday. He used to Dulcolax suppositories without improvement. When he woke up this morning he had the urge to defecate but was unable to. He tried to use a glycerin suppository but was unable to have a probably. He has had a small amount of liquid stool leakage today. He is complaining of increasing moderate rectal pain. No rectal bleeding.  Patient is a 79 y.o. male presenting with abdominal pain.  Abdominal Pain Associated symptoms: constipation     Past Medical History  Diagnosis Date  . Atrial fibrillation     s/p cardioversion October 2012  . Hypertension   . AAA (abdominal aortic aneurysm)     s/p stent graft in September 2009 & with attempted embolization/occlusion of vessels for a type 2 leak around the stent graft; Managed by Dr. Darrick Penna; last CT in 2012 showing the leak had closed.   [name] History of carotid stenosis     s/p L CEA in September 2006  . Gout   . Arthritis   . Hyperlipidemia   . Wears glasses   . Coronary artery disease     Remote CABG x 5 in 2000  . Aortic stenosis     s/p AVR in September 2012 per Dr. Laneta Simmers  . PVC's (premature ventricular contractions)   . PAT (paroxysmal atrial tachycardia)   . Inguinal hernia     RIH  . Emphysema of lung   . High risk medication use     on amiodarone  . Chronic anticoagulation   . Gallstones     s/p cholecystectomy in Dec 2012  . Stroke 2006    three strokes -no residuals  . Urinary  frequency     at night  . Anemia     on iron pills  . CHF (congestive heart failure)     SEPT AND OCT 2012; follow up echo in October shows EF of 50%.     Past Surgical History  Procedure Laterality Date  . Tonsillectomy    . Joint replacement      Right TKR  . Abdominal aortic aneurysm repair  2008    Gore Excluder Stent Graft repair  . Carotid endarterectomy  2006    Left Side  . Hemorrhoid surgery    . Replacement total knee  2008  . Abdominal aortic aneurysm repair  2010/2012  . Cardiac catheterization  04/29/1998    EF 60%  . US echocardiography  08/18/2009    EF 55-60%  . US echocardiography  04/22/2007    EF 55-60%  . Cardiovascular stress test  08/19/2009  . Aortic valve replacement  10/24/10    AVR  #25 mm Magna Ease pericardial valve  . Hernia repair  10/13/10    RIH  . Colon surgery      lap right colon  . Hemorrhoid surgery    . Coronary artery bypass graft  1970    bypass and open heart surgery--pt states this is incorrect--his only cabg was in 2000  .  Coronary artery bypass graft  2000    5 vessel BY DR.BARTEL. LIMA GRAFT TO THE LAD, SEQUENTIAL SAPHENOUS VEIN GRAFT TO THE  FIRST DIAGONAL AND FIRST OBTUSE MARGINAL VESSELS, SAPHENOUS VEIN GRAFT TO THE SECOND OBTUSE MARGINAL VESSEL, AND SAPHENOUS VEIN GRAFT TO THE PDA  . Eye surgery      bilateral cataract extractions  . Cholecystectomy  01/23/2011    Procedure: LAPAROSCOPIC CHOLECYSTECTOMY WITH INTRAOPERATIVE CHOLANGIOGRAM;  Surgeon: Emelia Loron, MD;  Location: [facility];  Service: General;  Laterality: N/A;    Family History  Problem Relation Age of Onset  . Other Mother     falopian tube during pregnancy   . Heart disease Father   . Cancer Brother     liver     History  Substance Use Topics  . Smoking status: Current Every Day Smoker    Types: Cigars  . Smokeless tobacco: Never Used     Comment: pt states he smokes 3-4 cigars every day  . Alcohol Use: 12.6 oz/week    21 Cans of beer per week       Review of Systems  Gastrointestinal: Positive for abdominal pain, constipation and rectal pain.  All other systems reviewed and are negative.    Allergies  Review of patient's allergies indicates no known allergies.  Home Medications   Current Outpatient Rx  Name  Route  Sig  Dispense  Refill  . acetaminophen (TYLENOL) 500 MG tablet   Oral   Take 1,000 mg by mouth every 6 (six) hours as needed. For pain         . acetaminophen-codeine (TYLENOL #3) 300-30 MG per tablet      TAKE 1 TO 2 TABLETS BY MOUTH EVERY 4 TO 6 HOURS AS NEEDED FOR PAIN   50 tablet   0   . aspirin 81 MG tablet   Oral   Take 81 mg by mouth every morning.          . carvedilol (COREG) 6.25 MG tablet   Oral   Take 1 tablet (6.25 mg total) by mouth 2 (two) times daily with a meal.   60 tablet   6   . gabapentin (NEURONTIN) 300 MG capsule   Oral   Take 1 capsule (300 mg total) by mouth 3 (three) times daily.   90 capsule   11   . levothyroxine (SYNTHROID, LEVOTHROID) 25 MCG tablet   Oral   Take 1 tablet (25 mcg total) by mouth daily.   30 tablet   11   . Multiple Vitamin (MULTIVITAMIN) tablet   Oral   Take 1 tablet by mouth daily.          . ramipril (ALTACE) 5 MG capsule   Oral   Take 1 capsule (5 mg total) by mouth every morning.   30 capsule   6     BP 136/60  Resp 18  SpO2 92%  Physical Exam  Constitutional: He is oriented to person, place, and time. He appears well-developed and well-nourished. No distress.  HENT:  Head: Normocephalic and atraumatic.  Right Ear: Hearing normal.  Nose: Nose normal.  Mouth/Throat: Oropharynx is clear and moist and mucous membranes are normal.  Eyes: Conjunctivae and EOM are normal. Pupils are equal, round, and reactive to light.  Neck: Normal range of motion. Neck supple.  Cardiovascular: Normal rate, regular rhythm, S1 normal and S2 normal.  Exam reveals no gallop and no friction rub.   No murmur heard. Pulmonary/Chest: Effort  normal and breath sounds normal. No respiratory distress. He exhibits no tenderness.  Abdominal: Soft. Normal appearance. Bowel sounds are decreased. There is no hepatosplenomegaly. There is no tenderness. There is no rebound, no guarding, no tenderness at McBurney's point and negative Murphy's sign. No hernia.  Musculoskeletal: Normal range of motion.  Neurological: He is alert and oriented to person, place, and time. He has normal strength. No cranial nerve deficit or sensory deficit. Coordination normal. GCS eye subscore is 4. GCS verbal subscore is 5. GCS motor subscore is 6.  Skin: Skin is warm, dry and intact. No rash noted. No cyanosis.  Psychiatric: He has a normal mood and affect. His speech is normal and behavior is normal. Thought content normal.    ED Course  Procedures (including critical care time)  EKG:  Date: 05/16/2012  Rate: 65  Rhythm: normal sinus rhythm  QRS Axis: left  Intervals: PR prolonged  ST/T Wave abnormalities: LBBB  Conduction Disutrbances:first-degree A-V block  and left bundle branch block  Narrative Interpretation:   Old EKG Reviewed: unchanged    Labs Reviewed  CBC WITH DIFFERENTIAL - Abnormal; Notable for the following:    RBC 3.97 (*)    HCT 37.8 (*)    Platelets 141 (*)    All other components within normal limits  BASIC METABOLIC PANEL - Abnormal; Notable for the following:    Glucose, Bld 118 (*)    GFR calc non Af Amer 57 (*)    GFR calc Af Amer 66 (*)    All other components within normal limits  LIPASE, BLOOD  URINALYSIS, ROUTINE W REFLEX MICROSCOPIC  OCCULT BLOOD X 1 CARD TO LAB, STOOL  POCT I-STAT TROPONIN I   Dg Abd Acute W/chest  05/16/2012  *RADIOLOGY REPORT*  Clinical Data: Abdominal pain, diarrhea, history hypertension, coronary artery disease post CABG, stroke, abdominal aortic aneurysm, emphysema  ACUTE ABDOMEN SERIES (ABDOMEN 2 VIEW & CHEST 1 VIEW)  Comparison: Chest radiograph 01/20/2011 Correlation:  CT abdomen pelvis  12/14/2011  Findings: Normal-sized cardiac silhouette post CABG and AVR. Atherosclerotic calcification aorta. Pulmonary vascularity normal. Emphysematous and minimal bronchitic changes. Minimal bibasilar atelectasis. No infiltrate, pleural effusion or pneumothorax. Abdominal aortic stent graft for AAA extending into the iliac arteries. Surgical clips right upper quadrant question cholecystectomy. Large radiodensity seen adjacent to the stent at L3 level is unchanged since prior CT, of uncertain etiology. Normal bowel gas pattern without bowel dilatation, bowel wall thickening, or free intraperitoneal air. Scattered atherosclerotic calcifications.  IMPRESSION: COPD changes with bibasilar atelectasis. No acute abdominal findings. Post CABG and AVR, abdominal aortic aneurysm stent grafting, and cholecystectomy.   Original Report Authenticated By: Ulyses Southward, M.D.      Diagnosis: 1. Constipation 2. External hemorrhoids    MDM  Patient comes to the ER for evaluation of constipation. Patient has recently had his analgesia increased to methadone and has had constipation since. She has a benign and soft abdominal exam. Remainder of the exam is unremarkable. Workup was unremarkable other than a large amount of stool in the distal transverse and descending colon. Rectal exam reveals external hemorrhoids which appear to be bleeding. There is a large amount of soft stool in the rectum. Attempts were made to manually remove the stool, but patient did not tolerate this. It was also difficult because of the softness. Patient therefore administered fleets enema and will be managed as an outpatient with polyethylene glycol.        Gilda Crease, MD 05/16/12 1249

## 2012-05-22 ENCOUNTER — Encounter: Payer: Self-pay | Admitting: Cardiology

## 2012-05-22 ENCOUNTER — Ambulatory Visit (INDEPENDENT_AMBULATORY_CARE_PROVIDER_SITE_OTHER): Payer: Medicare Other | Admitting: Cardiology

## 2012-05-22 VITALS — BP 144/66 | HR 76 | Ht 71.0 in | Wt 166.4 lb

## 2012-05-22 DIAGNOSIS — Z952 Presence of prosthetic heart valve: Secondary | ICD-10-CM

## 2012-05-22 DIAGNOSIS — I714 Abdominal aortic aneurysm, without rupture, unspecified: Secondary | ICD-10-CM

## 2012-05-22 DIAGNOSIS — I10 Essential (primary) hypertension: Secondary | ICD-10-CM

## 2012-05-22 DIAGNOSIS — I2510 Atherosclerotic heart disease of native coronary artery without angina pectoris: Secondary | ICD-10-CM

## 2012-05-22 DIAGNOSIS — E785 Hyperlipidemia, unspecified: Secondary | ICD-10-CM

## 2012-05-22 DIAGNOSIS — Z954 Presence of other heart-valve replacement: Secondary | ICD-10-CM

## 2012-05-22 NOTE — Progress Notes (Signed)
[name] Date of Birth: 10-09-1926 Medical Record #[id number]  History of Present Illness: [name] is here today for a follow up visit.  He has a history of coronary disease and is status post CABG in 2000. Cardiac catheterization this past year showed patent grafts. He developed severe aortic stenosis and underwent aortic valve replacement with a tissue prosthesis. Prior to surgery he had congestive heart fair with ejection fraction 25%. Postoperatively his ejection fraction improved to 45%. He did have atrial fibrillation during that time and was placed on amiodarone and Coumadin. He was cardioverted in October of 2012. He is now off of amiodarone. He also has an abdominal aortic aneurysm and is status post stent grafting in 2009. He has a history of type II endoleak and had a coil procedure on one occasion. 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 According to Dr. Darrick Penna his options are to live with it or to undergo open repair. The patient has opted for the former.  More recently he has complained of pain in his back and legs. He has been diagnosed with severe lumbar stenosis. Nerve conduction studies are consistent with neuropathy. This has severely limited his quality of life and ability to cannulate. He is going to be evaluated at the spine Center at [facility].    Current Outpatient Prescriptions on File Prior to Visit  Medication Sig Dispense Refill  . aspirin 81 MG tablet Take 81 mg by mouth every morning.       . carvedilol (COREG) 6.25 MG tablet Take 1 tablet (6.25 mg total) by mouth 2 (two) times daily with a meal.  60 tablet  6  . DORZOLAMIDE HCL-TIMOLOL MAL OP Apply 1 drop to eye 2 (two) times daily.      [name] latanoprost (XALATAN) 0.005 % ophthalmic solution Place 1 drop into both eyes at bedtime.      [name] levothyroxine (SYNTHROID, LEVOTHROID) 25 MCG tablet Take 1 tablet (25 mcg total) by mouth daily.  30 tablet  11  . Multiple Vitamin  (MULTIVITAMIN) tablet Take 1 tablet by mouth daily.       . ramipril (ALTACE) 5 MG capsule Take 5 mg by mouth every morning.       No current facility-administered medications on file prior to visit.    No Known Allergies  Past Medical History  Diagnosis Date  . Atrial fibrillation     s/p cardioversion October 2012  . Hypertension   . AAA (abdominal aortic aneurysm)     s/p stent graft in September 2009 & with attempted embolization/occlusion of vessels for a type 2 leak around the stent graft; Managed by Dr. Darrick Penna; last CT in 2012 showing the leak had closed.   [name] History of carotid stenosis     s/p L CEA in September 2006  . Gout   . Arthritis   . Hyperlipidemia   . Wears glasses   . Coronary artery disease     Remote CABG x 5 in 2000  . Aortic stenosis     s/p AVR in September 2012 per Dr. Laneta Simmers  . PVC's (premature ventricular contractions)   . PAT (paroxysmal atrial tachycardia)   . Inguinal hernia     RIH  . Emphysema of lung   . High risk medication use     on amiodarone  . Chronic anticoagulation   . Gallstones     s/p cholecystectomy in Dec 2012  . Stroke 2006  three strokes -no residuals  . Urinary frequency     at night  . Anemia     on iron pills  . CHF (congestive heart failure)     SEPT AND OCT 2012; follow up echo in October shows EF of 50%.     Past Surgical History  Procedure Laterality Date  . Tonsillectomy    . Joint replacement      Right TKR  . Abdominal aortic aneurysm repair  2008    Gore Excluder Stent Graft repair  . Carotid endarterectomy  2006    Left Side  . Hemorrhoid surgery    . Replacement total knee  2008  . Abdominal aortic aneurysm repair  2010/2012  . Cardiac catheterization  04/29/1998    EF 60%  . US echocardiography  08/18/2009    EF 55-60%  . US echocardiography  04/22/2007    EF 55-60%  . Cardiovascular stress test  08/19/2009  . Aortic valve replacement  10/24/10    AVR  #25 mm Magna Ease pericardial valve  .  Hernia repair  10/13/10    RIH  . Colon surgery      lap right colon  . Hemorrhoid surgery    . Coronary artery bypass graft  1970    bypass and open heart surgery--pt states this is incorrect--his only cabg was in 2000  . Coronary artery bypass graft  2000    5 vessel BY DR.BARTEL. LIMA GRAFT TO THE LAD, SEQUENTIAL SAPHENOUS VEIN GRAFT TO THE  FIRST DIAGONAL AND FIRST OBTUSE MARGINAL VESSELS, SAPHENOUS VEIN GRAFT TO THE SECOND OBTUSE MARGINAL VESSEL, AND SAPHENOUS VEIN GRAFT TO THE PDA  . Eye surgery      bilateral cataract extractions  . Cholecystectomy  01/23/2011    Procedure: LAPAROSCOPIC CHOLECYSTECTOMY WITH INTRAOPERATIVE CHOLANGIOGRAM;  Surgeon: Emelia Loron, MD;  Location: [facility];  Service: General;  Laterality: N/A;    History  Smoking status  . Current Every Day Smoker  . Types: Cigars  Smokeless tobacco  . Never Used    Comment: pt states he smokes 3-4 cigars every day    History  Alcohol Use  . 12.6 oz/week  . 21 Cans of beer per week    Family History  Problem Relation Age of Onset  . Other Mother     falopian tube during pregnancy   . Heart disease Father   . Cancer Brother     liver     Review of Systems: As noted in history of present illness. All other systems were reviewed and are negative.  Physical Exam: BP 144/66  Pulse 76  Ht 5\' 11"  (1.803 m)  Wt 166 lb 6.4 oz (75.479 kg)  BMI 23.22 kg/m2 Patient is  pleasant and in no acute distress. Skin is warm and dry. Color is normal.  HEENT is unremarkable. Normocephalic/atraumatic. PERRL. Sclera are nonicteric. Neck is supple. No masses. No JVD. Lungs are clear. Cardiac exam shows a regular rate and rhythm. His aortic valve sound is normal. There is a soft systolic ejection murmur. No gallop or diastolic murmur. Abdomen is soft. Extremities reveal 1+ right lower extremity edema..  Gait and ROM are intact. No gross neurologic deficits noted.  LABORATORY DATA:     Assessment / Plan: 1. Aortic  stenosis status post tissue aortic valve replacement in September 2012.  2. Coronary disease status post CABG in 2000. Cardiac catheterization 2012 year showed patent grafts.  3. Congestive heart failure, chronic systolic. Last ejection fraction of  45%. He appears to be well compensated on combination of carvedilol, Lasix, and Altace.  4. Atrial fibrillation. He has maintained sinus rhythm off of amiodarone. Have not recommended long-term anticoagulation given his abdominal aneurysm and the fact that he has been able to maintain sinus rhythm.  5. Spinal stenosis. We discussed his surgical risk. I think he is an acceptable risk from a cardiac standpoint. I think if surgery would offer him significant improvement in his quality of life and ability to ambulate it would be worth it to him to have this procedure. We will await his evaluation.  6. Abdominal aortic aneurysm. Status post stent graft with subsequent  type II leak.   7. Hypothyroidism.

## 2012-07-28 ENCOUNTER — Other Ambulatory Visit: Payer: Self-pay | Admitting: Cardiology

## 2012-08-13 ENCOUNTER — Other Ambulatory Visit: Payer: Self-pay | Admitting: Cardiology

## 2012-08-15 ENCOUNTER — Other Ambulatory Visit: Payer: Self-pay | Admitting: Cardiology

## 2012-08-23 IMAGING — CR DG CHEST 1V PORT
1 series · 1 of 1 positions shown · non-contrast
Comparison: 10/24/2010

CLINICAL DATA: Status post CABG.

PORTABLE CHEST - 1 VIEW

[AP]
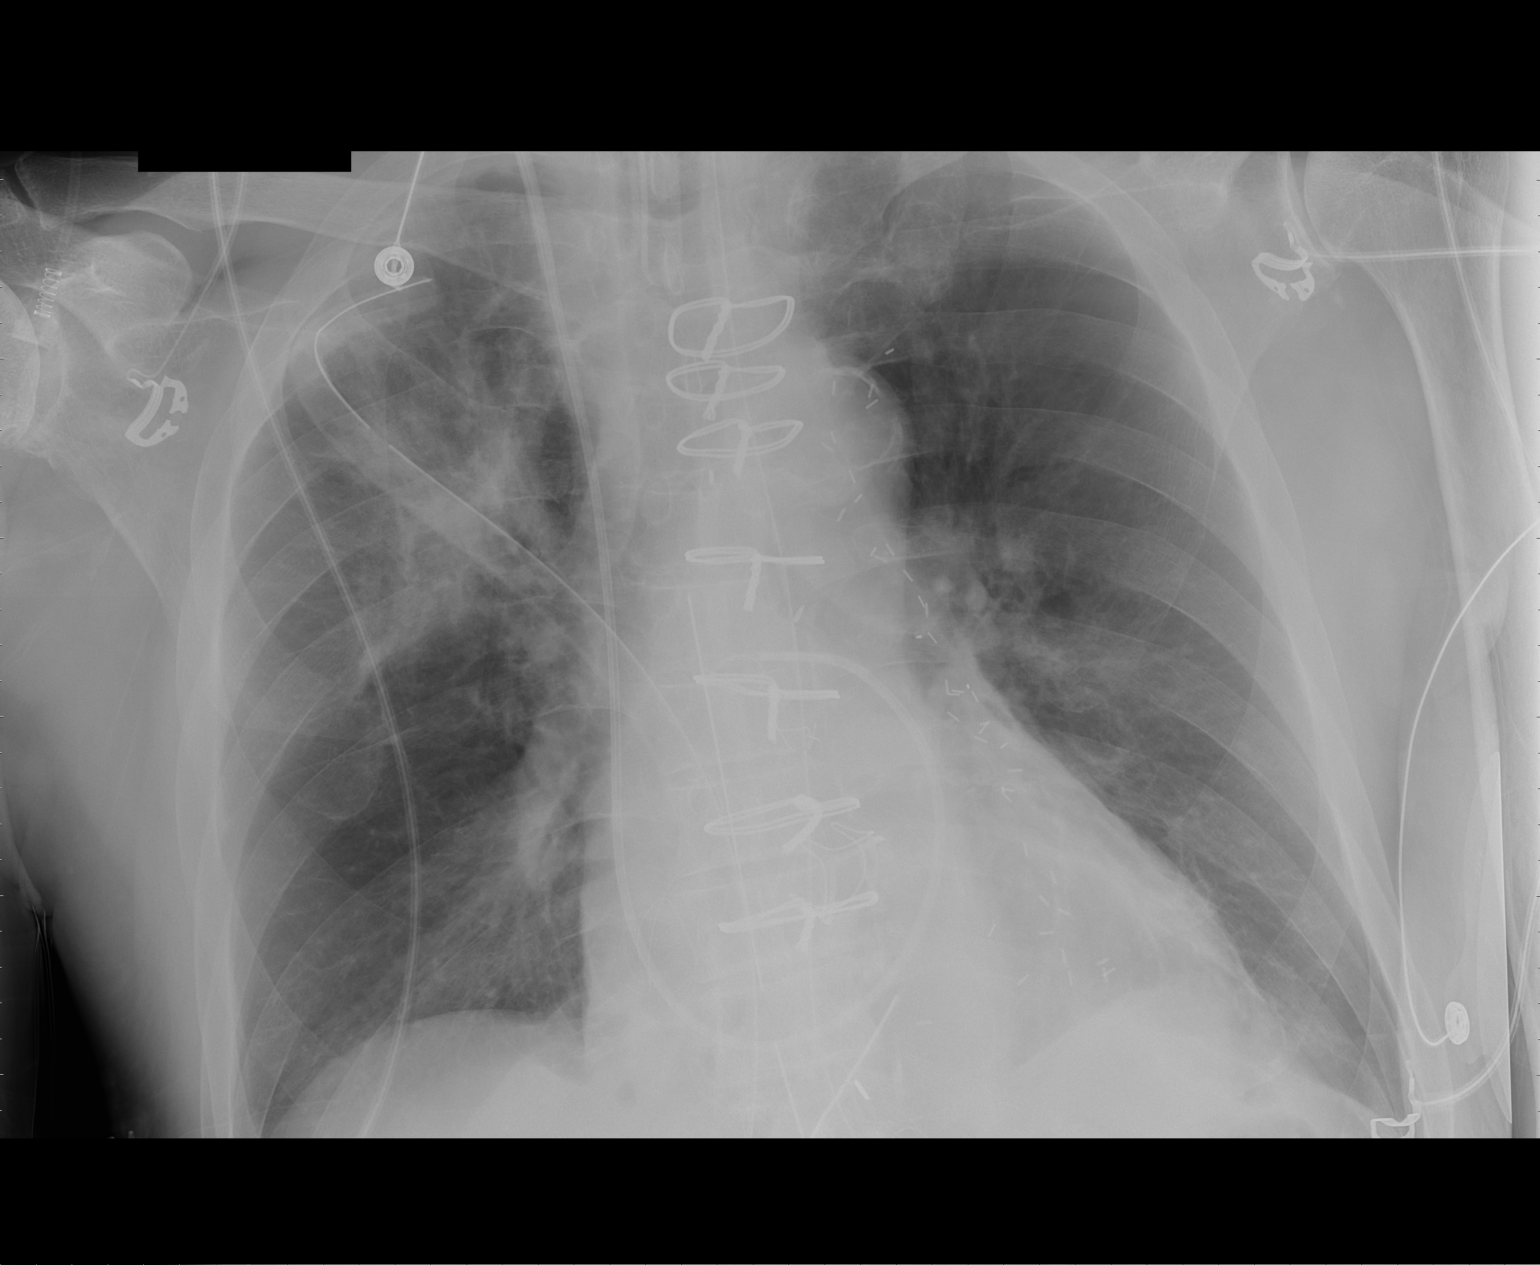

[1 of 1 positions shown; findings below may reference images not displayed]

FINDINGS: The patient has had median sternotomy and valve
replacement.  Endotracheal tube has been placed.  Tip is
approximately 7.7 cm above carina.  Nasogastric tube is in place
with tip off the film but at least to the esophagogastric junction.
Mediastinal drains have been placed.  Swan-Ganz catheter tip
overlies the pulmonary outflow tract, directed towards the right
main pulmonary artery.  Right chest tube is in place.  There is no
evidence for pneumothorax.

The heart is enlarged.  There is perihilar atelectasis.  Persistent
right suprahilar density is noted.
IMPRESSION: 1.  Status post median sternotomy, valve replacement.
2.  Lines and drains as described.
3.  No evidence for pneumothorax.

## 2012-09-19 ENCOUNTER — Encounter: Payer: Self-pay | Admitting: Cardiology

## 2012-09-24 ENCOUNTER — Encounter: Payer: Self-pay | Admitting: Cardiology

## 2012-10-08 ENCOUNTER — Other Ambulatory Visit: Payer: Self-pay

## 2012-10-08 MED ORDER — RAMIPRIL 5 MG PO CAPS: 5.0000 mg | ORAL_CAPSULE | ORAL | Status: DC

## 2012-11-07 ENCOUNTER — Encounter (HOSPITAL_COMMUNITY): Payer: Self-pay | Admitting: Emergency Medicine

## 2012-11-07 ENCOUNTER — Emergency Department (HOSPITAL_COMMUNITY)
Admission: EM | Admit: 2012-11-07 | Discharge: 2012-11-07 | Disposition: A | Discharge status: Home / Self Care | Payer: Medicare Other | Attending: Emergency Medicine | Admitting: Emergency Medicine

## 2012-11-07 ENCOUNTER — Emergency Department (HOSPITAL_COMMUNITY): Payer: Medicare Other

## 2012-11-07 DIAGNOSIS — Z7982 Long term (current) use of aspirin: Secondary | ICD-10-CM | POA: Insufficient documentation

## 2012-11-07 DIAGNOSIS — J189 Pneumonia, unspecified organism: Secondary | ICD-10-CM | POA: Insufficient documentation

## 2012-11-07 DIAGNOSIS — Z8709 Personal history of other diseases of the respiratory system: Secondary | ICD-10-CM | POA: Insufficient documentation

## 2012-11-07 DIAGNOSIS — Z87448 Personal history of other diseases of urinary system: Secondary | ICD-10-CM | POA: Insufficient documentation

## 2012-11-07 DIAGNOSIS — Z7901 Long term (current) use of anticoagulants: Secondary | ICD-10-CM | POA: Insufficient documentation

## 2012-11-07 DIAGNOSIS — Z8673 Personal history of transient ischemic attack (TIA), and cerebral infarction without residual deficits: Secondary | ICD-10-CM | POA: Insufficient documentation

## 2012-11-07 DIAGNOSIS — I2510 Atherosclerotic heart disease of native coronary artery without angina pectoris: Secondary | ICD-10-CM | POA: Insufficient documentation

## 2012-11-07 DIAGNOSIS — R0602 Shortness of breath: Secondary | ICD-10-CM | POA: Insufficient documentation

## 2012-11-07 DIAGNOSIS — F17200 Nicotine dependence, unspecified, uncomplicated: Secondary | ICD-10-CM | POA: Insufficient documentation

## 2012-11-07 DIAGNOSIS — Z8719 Personal history of other diseases of the digestive system: Secondary | ICD-10-CM | POA: Insufficient documentation

## 2012-11-07 DIAGNOSIS — I10 Essential (primary) hypertension: Secondary | ICD-10-CM | POA: Insufficient documentation

## 2012-11-07 DIAGNOSIS — Z862 Personal history of diseases of the blood and blood-forming organs and certain disorders involving the immune mechanism: Secondary | ICD-10-CM | POA: Insufficient documentation

## 2012-11-07 DIAGNOSIS — I509 Heart failure, unspecified: Secondary | ICD-10-CM | POA: Insufficient documentation

## 2012-11-07 DIAGNOSIS — Z8679 Personal history of other diseases of the circulatory system: Secondary | ICD-10-CM | POA: Insufficient documentation

## 2012-11-07 DIAGNOSIS — Z8739 Personal history of other diseases of the musculoskeletal system and connective tissue: Secondary | ICD-10-CM | POA: Insufficient documentation

## 2012-11-07 DIAGNOSIS — Z8639 Personal history of other endocrine, nutritional and metabolic disease: Secondary | ICD-10-CM | POA: Insufficient documentation

## 2012-11-07 DIAGNOSIS — Z79899 Other long term (current) drug therapy: Secondary | ICD-10-CM | POA: Insufficient documentation

## 2012-11-07 DIAGNOSIS — E785 Hyperlipidemia, unspecified: Secondary | ICD-10-CM | POA: Insufficient documentation

## 2012-11-07 DIAGNOSIS — R233 Spontaneous ecchymoses: Secondary | ICD-10-CM | POA: Insufficient documentation

## 2012-11-07 DIAGNOSIS — Z951 Presence of aortocoronary bypass graft: Secondary | ICD-10-CM | POA: Insufficient documentation

## 2012-11-07 LAB — CBC
HCT: 37.2 % — ABNORMAL LOW (ref 39.0–52.0)
Hemoglobin: 12.8 g/dL — ABNORMAL LOW (ref 13.0–17.0)
MCH: 33.1 pg (ref 26.0–34.0)
MCHC: 34.4 g/dL (ref 30.0–36.0)
RDW: 13.5 % (ref 11.5–15.5)

## 2012-11-07 LAB — BASIC METABOLIC PANEL
BUN: 26 mg/dL — ABNORMAL HIGH (ref 6–23)
Calcium: 9.7 mg/dL (ref 8.4–10.5)
GFR calc Af Amer: 68 mL/min — ABNORMAL LOW (ref >90)
GFR calc non Af Amer: 58 mL/min — ABNORMAL LOW (ref >90)
Glucose, Bld: 80 mg/dL (ref 70–99)
Potassium: 4.6 mEq/L (ref 3.5–5.1)
Sodium: 134 mEq/L — ABNORMAL LOW (ref 135–145)

## 2012-11-07 MED ORDER — PROBIOTIC DAILY PO CAPS: 1.0000 | ORAL_CAPSULE | Freq: Every day | ORAL | Status: DC

## 2012-11-07 MED ORDER — BENZONATATE 100 MG PO CAPS: 100.0000 mg | ORAL_CAPSULE | Freq: Three times a day (TID) | ORAL | Status: DC

## 2012-11-07 MED ORDER — IOHEXOL 350 MG/ML SOLN
100.0000 mL | Freq: Once | INTRAVENOUS | Status: AC | PRN
  Administered 2012-11-07: 100 mL via INTRAVENOUS

## 2012-11-07 MED ORDER — LEVOFLOXACIN 500 MG PO TABS: 500.0000 mg | ORAL_TABLET | Freq: Every day | ORAL | Status: DC

## 2012-11-07 MED ORDER — LEVOFLOXACIN 500 MG PO TABS
500.0000 mg | ORAL_TABLET | Freq: Once | ORAL | Status: AC
  Administered 2012-11-07: 500 mg via ORAL
  Filled 2012-11-07: qty 1

## 2012-11-07 MED ORDER — HYDROCOD POLST-CHLORPHEN POLST 10-8 MG/5ML PO LQCR: 5.0000 mL | Freq: Two times a day (BID) | ORAL | Status: DC

## 2012-11-07 NOTE — ED Notes (Signed)
Pt with left sided hematoma at left rib area x 3 days; pt c/o cough x 10 days; pt denies injury or blood thinners; pt sts pain with cough and some SOB

## 2012-11-07 NOTE — ED Notes (Signed)
IV removed from rt.forearm.Bleeding controlled 2x2  gauze applied with tape.

## 2012-11-07 NOTE — ED Provider Notes (Signed)
CSN: [id number]     Arrival date & time 11/07/12  1342 History   First MD Initiated Contact with Patient 11/07/12 1424     Chief Complaint  Patient presents with  . Cough  . Bleeding/Bruising    HPI  [name] is an otherwise very functional independent 79 year old male. He recently had a trip to the beach and did develop a cough. Upon returning home about 2 weeks ago he saw his primary care physician. He was told he had bronchitis. Placed on doxycycline and Spiriva.  He is really not gotten better. He's been coughing. He has pain on his left side lower ribs if he attempts to lay on that side. He does not fill the cracker snapping or popping or crepitus. However he has developed some ecchymosis from the sternum left laterally and inferiorly along the course of his lower rib margin. Seen by his primary care physician today was sent here for evaluation and concern for pneumonia or effusion. He has not had fever. He does have a persistent cough. She denies feeling short of breath. He has scant sputum. Does not have any chronic lung disease. He is not currently anticoagulated. He has a bovine mitral valve.  No upper abdominal pain. No falls injury or trauma to the area. No other areas of ecchymosis petechiae or bruising.  Past Medical History  Diagnosis Date  . Atrial fibrillation     s/p cardioversion October 2012  . Hypertension   . AAA (abdominal aortic aneurysm)     s/p stent graft in September 2009 & with attempted embolization/occlusion of vessels for a type 2 leak around the stent graft; Managed by Dr. Darrick Penna; last CT in 2012 showing the leak had closed.   [name] History of carotid stenosis     s/p L CEA in September 2006  . Gout   . Arthritis   . Hyperlipidemia   . Wears glasses   . Coronary artery disease     Remote CABG x 5 in 2000  . Aortic stenosis     s/p AVR in September 2012 per Dr. Laneta Simmers  . PVC's (premature ventricular contractions)   . PAT (paroxysmal atrial tachycardia)     . Inguinal hernia     RIH  . Emphysema of lung   . High risk medication use     on amiodarone  . Chronic anticoagulation   . Gallstones     s/p cholecystectomy in Dec 2012  . Stroke 2006    three strokes -no residuals  . Urinary frequency     at night  . Anemia     on iron pills  . CHF (congestive heart failure)     SEPT AND OCT 2012; follow up echo in October shows EF of 50%.    Past Surgical History  Procedure Laterality Date  . Tonsillectomy    . Joint replacement      Right TKR  . Abdominal aortic aneurysm repair  2008    Gore Excluder Stent Graft repair  . Carotid endarterectomy  2006    Left Side  . Hemorrhoid surgery    . Replacement total knee  2008  . Abdominal aortic aneurysm repair  2010/2012  . Cardiac catheterization  04/29/1998    EF 60%  . US echocardiography  08/18/2009    EF 55-60%  . US echocardiography  04/22/2007    EF 55-60%  . Cardiovascular stress test  08/19/2009  . Aortic valve replacement  10/24/10    AVR  #  25 mm Magna Ease pericardial valve  . Hernia repair  10/13/10    RIH  . Colon surgery      lap right colon  . Hemorrhoid surgery    . Coronary artery bypass graft  1970    bypass and open heart surgery--pt states this is incorrect--his only cabg was in 2000  . Coronary artery bypass graft  2000    5 vessel BY DR.BARTEL. LIMA GRAFT TO THE LAD, SEQUENTIAL SAPHENOUS VEIN GRAFT TO THE  FIRST DIAGONAL AND FIRST OBTUSE MARGINAL VESSELS, SAPHENOUS VEIN GRAFT TO THE SECOND OBTUSE MARGINAL VESSEL, AND SAPHENOUS VEIN GRAFT TO THE PDA  . Eye surgery      bilateral cataract extractions  . Cholecystectomy  01/23/2011    Procedure: LAPAROSCOPIC CHOLECYSTECTOMY WITH INTRAOPERATIVE CHOLANGIOGRAM;  Surgeon: Emelia Loron, MD;  Location: [facility];  Service: General;  Laterality: N/A;   Family History  Problem Relation Age of Onset  . Other Mother     falopian tube during pregnancy   . Heart disease Father   . Cancer Brother     liver    History   Substance Use Topics  . Smoking status: Current Every Day Smoker    Types: Cigars  . Smokeless tobacco: Never Used     Comment: pt states he smokes 3-4 cigars every day  . Alcohol Use: 12.6 oz/week    21 Cans of beer per week    Review of Systems  Constitutional: Negative for fever, chills, diaphoresis, appetite change and fatigue.  HENT: Negative for sore throat, mouth sores and trouble swallowing.   Eyes: Negative for visual disturbance.  Respiratory: Positive for cough and shortness of breath. Negative for chest tightness and wheezing.   Cardiovascular: Positive for chest pain.  Gastrointestinal: Negative for nausea, vomiting, abdominal pain, diarrhea and abdominal distention.  Endocrine: Negative for polydipsia, polyphagia and polyuria.  Genitourinary: Negative for dysuria, frequency and hematuria.  Musculoskeletal: Negative for gait problem.  Skin: Negative for color change, pallor and rash.  Neurological: Negative for dizziness, syncope, light-headedness and headaches.  Hematological: Bruises/bleeds easily.  Psychiatric/Behavioral: Negative for behavioral problems and confusion.    Allergies  Review of patient's allergies indicates no known allergies.  Home Medications   Current Outpatient Rx  Name  Route  Sig  Dispense  Refill  . aspirin EC 81 MG tablet   Oral   Take 81 mg by mouth daily.         . carvedilol (COREG) 6.25 MG tablet   Oral   Take 6.25 mg by mouth 2 (two) times daily with a meal.         . dorzolamide-timolol (COSOPT) 22.3-6.8 MG/ML ophthalmic solution      1 drop 2 (two) times daily.         [name] doxycycline (VIBRA-TABS) 100 MG tablet   Oral   Take 100 mg by mouth 2 (two) times daily.         . furosemide (LASIX) 40 MG tablet   Oral   Take 40 mg by mouth daily.         [name] HYDROcodone-homatropine (HYCODAN) 5-1.5 MG/5ML syrup   Oral   Take 5 mLs by mouth 4 (four) times daily as needed for cough.         . latanoprost (XALATAN)  0.005 % ophthalmic solution   Both Eyes   Place 1 drop into both eyes at bedtime.         [name] levothyroxine (SYNTHROID, LEVOTHROID) 25 MCG  tablet   Oral   Take 25 mcg by mouth daily before breakfast.         . montelukast (SINGULAIR) 10 MG tablet   Oral   Take 10 mg by mouth at bedtime.         . Multiple Vitamin (MULTIVITAMIN) tablet   Oral   Take 1 tablet by mouth daily.          . ramipril (ALTACE) 5 MG capsule   Oral   Take 1 capsule (5 mg total) by mouth every morning.   30 capsule   6   . traMADol (ULTRAM) 50 MG tablet   Oral   Take 50 mg by mouth every 6 (six) hours as needed for pain.         . benzonatate (TESSALON) 100 MG capsule   Oral   Take 1 capsule (100 mg total) by mouth every 8 (eight) hours.   21 capsule   0   . chlorpheniramine-HYDROcodone (TUSSIONEX PENNKINETIC ER) 10-8 MG/5ML LQCR   Oral   Take 5 mLs by mouth every 12 (twelve) hours.   60 mL   0   . levofloxacin (LEVAQUIN) 500 MG tablet   Oral   Take 1 tablet (500 mg total) by mouth daily.   10 tablet   0   . Probiotic Product (PROBIOTIC DAILY) CAPS   Oral   Take 1 capsule by mouth daily.   14 capsule   0     1 probiotic capsule (per pharmacist recommendation ...    BP 133/64  Pulse 54  Temp(Src) 98 F (36.7 C) (Oral)  Resp 16  SpO2 97% Physical Exam  Constitutional: He is oriented to person, place, and time. He appears well-developed and well-nourished. No distress.  This is a very conversant animated a 51-year-old male. He is in no distress with no dysmetria upon conversation.  HENT:  Head: Normocephalic.  Eyes: Conjunctivae are normal. Pupils are equal, round, and reactive to light. No scleral icterus.  Neck: Normal range of motion. Neck supple. No thyromegaly present.  Cardiovascular: Normal rate, regular rhythm, S1 normal and S2 normal.  Exam reveals no gallop and no friction rub.   No murmur heard. Sinus rhythm on the monitor. No gallop.  Pulmonary/Chest: Effort  normal and breath sounds normal. No respiratory distress. He has no wheezes. He has no rales.    Curvilinear area of ecchymosis along the left anterolateral chest. He has no diminished breath sounds at the left base. No crackles rales. He is not tachypneic  Abdominal: Soft. Bowel sounds are normal. He exhibits no distension. There is no tenderness. There is no rebound.  Musculoskeletal: Normal range of motion.  Neurological: He is alert and oriented to person, place, and time.  Skin: Skin is warm and dry. No rash noted.  No petechiae or ecchymosis or other areas of bruising  Psychiatric: He has a normal mood and affect. His behavior is normal.    ED Course  Procedures (including critical care time) Labs Review Labs Reviewed  CBC - Abnormal; Notable for the following:    RBC 3.87 (*)    Hemoglobin 12.8 (*)    HCT 37.2 (*)    All other components within normal limits  BASIC METABOLIC PANEL - Abnormal; Notable for the following:    Sodium 134 (*)    BUN 26 (*)    GFR calc non Af Amer 58 (*)    GFR calc Af Amer 68 (*)    All  other components within normal limits  PROTIME-INR   Imaging Review Dg Chest 2 View  11/07/2012   *RADIOLOGY REPORT*  Clinical Data: Cough, shortness of breath.  CHEST - 2 VIEW  Comparison: May 16, 2012.  Findings: Status post coronary artery bypass graft and aortic valve replacement.  No acute pulmonary disease is noted.  No pleural effusion or pneumothorax is noted.  IMPRESSION: No acute cardiopulmonary abnormality seen.   Original Report Authenticated By: Lupita Raider.,  M.D.   Ct Angio Chest Pe W/cm &/or Wo Cm  11/07/2012   CLINICAL DATA:  Cough, shortness of breath for 2 weeks.  EXAM: CT ANGIOGRAPHY CHEST WITH CONTRAST  TECHNIQUE: Multidetector CT imaging of the chest was performed using the standard protocol during bolus administration of intravenous contrast. Multiplanar CT image reconstructions including MIPs were obtained to evaluate the vascular  anatomy.  CONTRAST:  OMNIPAQUE IOHEXOL 350 MG/ML SOLN  COMPARISON:  Comparison 11/07/2012 chest radiograph, chest CT 10/16/2010  FINDINGS: Evidence of median sternotomy noted. No focal filling defect is identified up to and including the 3rd order pulmonary arteries to suggest acute pulmonary embolism. Heart size is mildly enlarged. Evidence of CABG identified. No lymphadenopathy. No pericardial or pleural effusion. Moderate to severe atheromatous aortic and coronary arterial calcification noted.  Minimal subpleural scar linear scarring and or atelectasis noted. There are areas of patchy lingular and left lower lobe tree-in-bud type nodular airspace opacity and bronchial wall thickening. There is also central bronchial wall thickening bilaterally. Evaluation of the lung bases is degraded by respiratory motion. Dependent secretions are noted within the trachea.  No acute osseous abnormality.  Review of the MIP images confirms the above findings.  IMPRESSION: Lingular and left lower lobe pulmonary parenchymal patchy consolidation with tree-in-bud type nodular airspace opacity pattern and bronchial wall thickening compatible with small airways infection/pneumonia and superimposed bronchitis.  No evidence for pulmonary embolism up to and including the 3rd order pulmonary arteries.   Electronically Signed   By: Christiana Pellant M.D.   On: 11/07/2012 17:19    MDM   1. Community acquired pneumonia    Plan x-ray shows no acute abnormalities. CT of the chest shows left lower lobe infiltrates. No effusion. No obvious rib fracture. No pulmonary embolus out to third branch pulmonary arterial trunk. He is not hypoxemic. He is tolerating symptoms very well. He's quite desirous of being discharged home. Feel this is appropriate. I discussed with him about treatment. Her last him to take a probiotic capsule as well as he has just finished nearly 10 days of his doxycycline. Plan will be Levaquin for his community  acquired pneumonia. Tessalon for daytime cough. Tussionex for nighttime cough. Recheck with his primary care physician. Return to ER if acute changes fever, shortness of breath, worsening symptoms. Worsening pain, or other new or revolving symptoms not discussed today.    Roney Marion, MD 11/07/12 [phone]

## 2012-11-07 NOTE — ED Notes (Signed)
Pt waiting for levaquin from main pharmacy.

## 2012-11-19 ENCOUNTER — Ambulatory Visit: Payer: Medicare Other | Admitting: Cardiology

## 2012-12-09 ENCOUNTER — Ambulatory Visit
Admission: RE | Admit: 2012-12-09 | Discharge: 2012-12-09 | Disposition: A | Discharge status: Home / Self Care | Payer: Medicare HMO | Source: Ambulatory Visit | Attending: Family Medicine | Admitting: Family Medicine

## 2012-12-09 ENCOUNTER — Other Ambulatory Visit: Payer: Self-pay | Admitting: Family Medicine

## 2012-12-09 DIAGNOSIS — J189 Pneumonia, unspecified organism: Secondary | ICD-10-CM

## 2012-12-09 DIAGNOSIS — R0989 Other specified symptoms and signs involving the circulatory and respiratory systems: Secondary | ICD-10-CM

## 2012-12-13 ENCOUNTER — Ambulatory Visit (INDEPENDENT_AMBULATORY_CARE_PROVIDER_SITE_OTHER): Payer: Medicare Other | Admitting: Cardiology

## 2012-12-13 ENCOUNTER — Encounter: Payer: Self-pay | Admitting: Cardiology

## 2012-12-13 VITALS — BP 139/78 | HR 65 | Wt 166.0 lb

## 2012-12-13 DIAGNOSIS — I4891 Unspecified atrial fibrillation: Secondary | ICD-10-CM

## 2012-12-13 DIAGNOSIS — Z954 Presence of other heart-valve replacement: Secondary | ICD-10-CM

## 2012-12-13 DIAGNOSIS — I714 Abdominal aortic aneurysm, without rupture, unspecified: Secondary | ICD-10-CM

## 2012-12-13 DIAGNOSIS — Z952 Presence of prosthetic heart valve: Secondary | ICD-10-CM

## 2012-12-13 DIAGNOSIS — I359 Nonrheumatic aortic valve disorder, unspecified: Secondary | ICD-10-CM

## 2012-12-13 NOTE — Progress Notes (Signed)
[name] Date of Birth: [birth year] Medical Record #[id number]  History of Present Illness: [name] is here today for a follow up visit.  He has a history of coronary disease and is status post CABG in 2000. Cardiac catheterization September 2012 showed patent grafts. He developed severe aortic stenosis and underwent aortic valve replacement with a tissue prosthesis. Prior to surgery he had congestive heart fair with ejection fraction 25%. Postoperatively his ejection fraction improved to 45%. He did have atrial fibrillation during that time and was placed on amiodarone and Coumadin. He was cardioverted in October of 2012. He is now off of amiodarone. He also has an abdominal aortic aneurysm and is status post stent grafting in 2009. He has a history of type II endoleak and had a coil procedure on one occasion. 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 This is being managed conservatively. In may of this year he underwent lumbar surgery for lumbar stenosis. This did give him a lot of relief in his leg cramps. Since September he has been treated for pneumonia and bronchitis. This has been slow to resolve. He really has just started getting out of the house in the last couple of days. He still has a lingering cough. He denies any fever or chills. He has had no chest pain.   Current Outpatient Prescriptions on File Prior to Visit  Medication Sig Dispense Refill  . aspirin EC 81 MG tablet Take 81 mg by mouth daily.      . carvedilol (COREG) 6.25 MG tablet Take 6.25 mg by mouth 2 (two) times daily with a meal.      . chlorpheniramine-HYDROcodone (TUSSIONEX PENNKINETIC ER) 10-8 MG/5ML LQCR Take 5 mLs by mouth every 12 (twelve) hours.  60 mL  0  . dorzolamide-timolol (COSOPT) 22.3-6.8 MG/ML ophthalmic solution 1 drop 2 (two) times daily.      [name] latanoprost (XALATAN) 0.005 % ophthalmic solution Place 1 drop into both eyes at bedtime.      [name] levothyroxine (SYNTHROID,  LEVOTHROID) 25 MCG tablet Take 25 mcg by mouth daily before breakfast.      . montelukast (SINGULAIR) 10 MG tablet Take 10 mg by mouth at bedtime.      . Multiple Vitamin (MULTIVITAMIN) tablet Take 2 tablets by mouth daily.       . ramipril (ALTACE) 5 MG capsule Take 1 capsule (5 mg total) by mouth every morning.  30 capsule  6   No current facility-administered medications on file prior to visit.    No Known Allergies  Past Medical History  Diagnosis Date  . Atrial fibrillation     s/p cardioversion October 2012  . Hypertension   . AAA (abdominal aortic aneurysm)     s/p stent graft in September 2009 & with attempted embolization/occlusion of vessels for a type 2 leak around the stent graft; Managed by Dr. Darrick Penna; last CT in 2012 showing the leak had closed.   [name] History of carotid stenosis     s/p L CEA in September 2006  . Gout   . Arthritis   . Hyperlipidemia   . Wears glasses   . Coronary artery disease     Remote CABG x 5 in 2000  . Aortic stenosis     s/p AVR in September 2012 per Dr. Laneta Simmers  . PVC's (premature ventricular contractions)   . PAT (paroxysmal atrial tachycardia)   . Inguinal hernia     RIH  . Emphysema of  lung   . High risk medication use     on amiodarone  . Chronic anticoagulation   . Gallstones     s/p cholecystectomy in Dec 2012  . Stroke 2006    three strokes -no residuals  . Urinary frequency     at night  . Anemia     on iron pills  . CHF (congestive heart failure)     SEPT AND OCT 2012; follow up echo in October shows EF of 50%.     Past Surgical History  Procedure Laterality Date  . Tonsillectomy    . Joint replacement      Right TKR  . Abdominal aortic aneurysm repair  2008    Gore Excluder Stent Graft repair  . Carotid endarterectomy  2006    Left Side  . Hemorrhoid surgery    . Replacement total knee  2008  . Abdominal aortic aneurysm repair  2010/2012  . Cardiac catheterization  04/29/1998    EF 60%  . US echocardiography   08/18/2009    EF 55-60%  . US echocardiography  04/22/2007    EF 55-60%  . Cardiovascular stress test  08/19/2009  . Aortic valve replacement  10/24/10    AVR  #25 mm Magna Ease pericardial valve  . Hernia repair  10/13/10    RIH  . Colon surgery      lap right colon  . Hemorrhoid surgery    . Coronary artery bypass graft  1970    bypass and open heart surgery--pt states this is incorrect--his only cabg was in 2000  . Coronary artery bypass graft  2000    5 vessel BY DR.BARTEL. LIMA GRAFT TO THE LAD, SEQUENTIAL SAPHENOUS VEIN GRAFT TO THE  FIRST DIAGONAL AND FIRST OBTUSE MARGINAL VESSELS, SAPHENOUS VEIN GRAFT TO THE SECOND OBTUSE MARGINAL VESSEL, AND SAPHENOUS VEIN GRAFT TO THE PDA  . Eye surgery      bilateral cataract extractions  . Cholecystectomy  01/23/2011    Procedure: LAPAROSCOPIC CHOLECYSTECTOMY WITH INTRAOPERATIVE CHOLANGIOGRAM;  Surgeon: Emelia Loron, MD;  Location: [facility];  Service: General;  Laterality: N/A;    History  Smoking status  . Current Every Day Smoker  . Types: Cigars  Smokeless tobacco  . Never Used    Comment: pt states he smokes 3-4 cigars every day    History  Alcohol Use  . 12.6 oz/week  . 21 Cans of beer per week    Family History  Problem Relation Age of Onset  . Other Mother     falopian tube during pregnancy   . Heart disease Father   . Cancer Brother     liver     Review of Systems: As noted in history of present illness.he does complain of pain in his knees bilaterally. All other systems were reviewed and are negative.  Physical Exam: BP 139/78  Pulse 65  Wt 166 lb (75.297 kg)  BMI 23.16 kg/m2 Patient is  Pleasant, elderly and appears chronically ill. Skin is warm and dry. Color is normal.  HEENT is unremarkable. Normocephalic/atraumatic. PERRL. Sclera are nonicteric. Neck is supple. No masses. No JVD. Lungs are clear. Cardiac exam shows a regular rate and rhythm. His aortic valve sound is normal. There is a soft systolic ejection  murmur. No gallop or diastolic murmur. Abdomen is soft. Extremities reveal no lower extremity edema..  Gait and ROM are intact. No gross neurologic deficits noted.  LABORATORY DATA:     Assessment / Plan: 1. Aortic  stenosis status post tissue aortic valve replacement in September 2012.  2. Coronary disease status post CABG in 2000. Cardiac catheterization 2012 year showed patent grafts.  3. Congestive heart failure, chronic systolic. Last ejection fraction of 45%. He appears to be well compensated on combination of carvedilol, Lasix, and Altace.  4. Atrial fibrillation. He has maintained sinus rhythm.Have not recommended long-term anticoagulation given his abdominal aneurysm and the fact that he has been able to maintain sinus rhythm.  5. Spinal stenosis. Status post surgery with improvement in his symptoms.  6. Abdominal aortic aneurysm. Status post stent graft with subsequent  type II leak.   7. Hypothyroidism.   8. Recent pneumonia. Slowly improving.

## 2013-02-14 NOTE — Telephone Encounter (Signed)
No other info °

## 2013-03-19 ENCOUNTER — Other Ambulatory Visit: Payer: Self-pay

## 2013-03-19 MED ORDER — CARVEDILOL 6.25 MG PO TABS: 6.2500 mg | ORAL_TABLET | Freq: Two times a day (BID) | ORAL | Status: DC

## 2013-04-23 ENCOUNTER — Other Ambulatory Visit: Payer: Self-pay | Admitting: Cardiology

## 2013-05-13 ENCOUNTER — Other Ambulatory Visit: Payer: Self-pay | Admitting: Cardiology

## 2013-05-13 NOTE — Telephone Encounter (Signed)
I am not sure if Dr [place] manages this. No recent tsh either. Please advise. Thanks, MI

## 2013-05-15 ENCOUNTER — Other Ambulatory Visit: Payer: Self-pay | Admitting: Cardiology

## 2013-06-30 ENCOUNTER — Encounter: Payer: Self-pay | Admitting: Cardiology

## 2013-06-30 ENCOUNTER — Ambulatory Visit (INDEPENDENT_AMBULATORY_CARE_PROVIDER_SITE_OTHER): Payer: Medicare HMO | Admitting: Cardiology

## 2013-06-30 VITALS — BP 155/82 | HR 82 | Ht 71.0 in | Wt 177.0 lb

## 2013-06-30 DIAGNOSIS — I2510 Atherosclerotic heart disease of native coronary artery without angina pectoris: Secondary | ICD-10-CM

## 2013-06-30 DIAGNOSIS — I714 Abdominal aortic aneurysm, without rupture, unspecified: Secondary | ICD-10-CM

## 2013-06-30 DIAGNOSIS — Z952 Presence of prosthetic heart valve: Secondary | ICD-10-CM

## 2013-06-30 DIAGNOSIS — I4891 Unspecified atrial fibrillation: Secondary | ICD-10-CM

## 2013-06-30 DIAGNOSIS — Z954 Presence of other heart-valve replacement: Secondary | ICD-10-CM

## 2013-06-30 DIAGNOSIS — I10 Essential (primary) hypertension: Secondary | ICD-10-CM

## 2013-06-30 MED ORDER — CARVEDILOL 6.25 MG PO TABS: 6.2500 mg | ORAL_TABLET | Freq: Two times a day (BID) | ORAL | Status: DC

## 2013-06-30 NOTE — Patient Instructions (Signed)
Continue your current therapy  I will see you in 6 months.   

## 2013-07-01 NOTE — Progress Notes (Signed)
[name] Date of Birth: Jun 28, 1926 Medical Record #[id number]  History of Present Illness: [name] is here today for a follow up visit.  He has a history of coronary disease and is status post CABG in 2000. Cardiac catheterization September 2012 showed patent grafts. He developed severe aortic stenosis and underwent aortic valve replacement with a tissue prosthesis. Prior to surgery he had congestive heart fair with ejection fraction 25%. Postoperatively his ejection fraction improved to 45%. He did have atrial fibrillation during that time and was placed on amiodarone and Coumadin. He was cardioverted in October of 2012. He is now off of amiodarone. He also has an abdominal aortic aneurysm and is status post stent grafting in 2009. He has a history of type II endoleak and had a coil procedure on one occasion. 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 This is being managed conservatively.  On follow up today he complains that he generally feels bad. He can't get around very well. His right knee bothers him a lot. He does still ride his exercise bike for 15 minutes 4-5 days a week. He has some chronic ankle edema R>L. No chest pain or SOB. He is frustrated with his limitations now.   Current Outpatient Prescriptions on File Prior to Visit  Medication Sig Dispense Refill  . aspirin EC 81 MG tablet Take 81 mg by mouth daily.      . dorzolamide-timolol (COSOPT) 22.3-6.8 MG/ML ophthalmic solution 1 drop 2 (two) times daily.      [name] latanoprost (XALATAN) 0.005 % ophthalmic solution Place 1 drop into both eyes at bedtime.      [name] levothyroxine (SYNTHROID, LEVOTHROID) 25 MCG tablet TAKE 1 TABLET (25 MCG TOTAL) BY MOUTH DAILY.  30 tablet  3  . Multiple Vitamin (MULTIVITAMIN) tablet Take 2 tablets by mouth daily.       . ramipril (ALTACE) 5 MG capsule TAKE 1 CAPSULE (5 MG TOTAL) BY MOUTH EVERY MORNING.  30 capsule  0   No current facility-administered medications on  file prior to visit.    No Known Allergies  Past Medical History  Diagnosis Date  . Atrial fibrillation     s/p cardioversion October 2012  . Hypertension   . AAA (abdominal aortic aneurysm)     s/p stent graft in September 2009 & with attempted embolization/occlusion of vessels for a type 2 leak around the stent graft; Managed by Dr. Oneida Alar; last CT in 2012 showing the leak had closed.   [name] History of carotid stenosis     s/p L CEA in September 2006  . Gout   . Arthritis   . Hyperlipidemia   . Wears glasses   . Coronary artery disease     Remote CABG x 5 in 2000  . Aortic stenosis     s/p AVR in September 2012 per Dr. Cyndia Bent  . PVC's (premature ventricular contractions)   . PAT (paroxysmal atrial tachycardia)   . Inguinal hernia     RIH  . Emphysema of lung   . High risk medication use     on amiodarone  . Chronic anticoagulation   . Gallstones     s/p cholecystectomy in Dec 2012  . Stroke 2006    three strokes -no residuals  . Urinary frequency     at night  . Anemia     on iron pills  . CHF (congestive heart failure)     SEPT AND OCT 2012; follow up  echo in October shows EF of 50%.     Past Surgical History  Procedure Laterality Date  . Tonsillectomy    . Joint replacement      Right TKR  . Abdominal aortic aneurysm repair  2008    Gore Excluder Stent Graft repair  . Carotid endarterectomy  2006    Left Side  . Hemorrhoid surgery    . Replacement total knee  2008  . Abdominal aortic aneurysm repair  2010/2012  . Cardiac catheterization  04/29/1998    EF 60%  . US echocardiography  08/18/2009    EF 55-60%  . US echocardiography  04/22/2007    EF 55-60%  . Cardiovascular stress test  08/19/2009  . Aortic valve replacement  10/24/10    AVR  #25 mm Magna Ease pericardial valve  . Hernia repair  10/13/10    RIH  . Colon surgery      lap right colon  . Hemorrhoid surgery    . Coronary artery bypass graft  1970    bypass and open heart surgery--pt states this is  incorrect--his only cabg was in 2000  . Coronary artery bypass graft  2000    5 vessel BY DR.BARTEL. LIMA GRAFT TO THE LAD, SEQUENTIAL SAPHENOUS VEIN GRAFT TO THE  FIRST DIAGONAL AND FIRST OBTUSE MARGINAL VESSELS, SAPHENOUS VEIN GRAFT TO THE SECOND OBTUSE MARGINAL VESSEL, AND SAPHENOUS VEIN GRAFT TO THE PDA  . Eye surgery      bilateral cataract extractions  . Cholecystectomy  01/23/2011    Procedure: LAPAROSCOPIC CHOLECYSTECTOMY WITH INTRAOPERATIVE CHOLANGIOGRAM;  Surgeon: Rolm Bookbinder, MD;  Location: [facility];  Service: General;  Laterality: N/A;    History  Smoking status  . Current Every Day Smoker  . Types: Cigars  Smokeless tobacco  . Never Used    Comment: pt states he smokes 3-4 cigars every day    History  Alcohol Use  . 12.6 oz/week  . 21 Cans of beer per week    Family History  Problem Relation Age of Onset  . Other Mother     falopian tube during pregnancy   . Heart disease Father   . Cancer Brother     liver     Review of Systems: As noted in history of present illness. All other systems were reviewed and are negative.  Physical Exam: BP 155/82  Pulse 82  Ht 5\' 11"  (1.803 m)  Wt 177 lb (80.287 kg)  BMI 24.70 kg/m2 Patient is  Pleasant, elderly and appears chronically ill. Skin is warm and dry. Color is normal.  HEENT is unremarkable. Normocephalic/atraumatic. PERRL. Sclera are nonicteric. Neck is supple. No masses. No JVD. Lungs are clear. Cardiac exam shows a regular rate and rhythm. His aortic valve sound is normal. There is a soft systolic ejection murmur. No gallop or diastolic murmur. Abdomen is soft. Extremities reveal 1+ right ankle edema..  Gait and ROM are intact. No gross neurologic deficits noted.  LABORATORY DATA:     Assessment / Plan: 1. Aortic stenosis status post tissue aortic valve replacement in September 2012. Asymptomatic.  2. Coronary disease status post CABG in 2000. Cardiac catheterization 2012 year showed patent grafts. No  active angina.   3. Congestive heart failure, chronic systolic. Last ejection fraction of 45%. He appears to be well compensated on combination of carvedilol, Lasix, and Altace.  4. Atrial fibrillation. He has maintained sinus rhythm. Have not recommended long-term anticoagulation given his abdominal aneurysm with persistent endoleak and the fact  that he has been able to maintain sinus rhythm.  5. Spinal stenosis. Status post surgery.  6. Abdominal aortic aneurysm. Status post stent graft with subsequent  type II leak. Not a candidate for percutaneous intervention. He has opted for conservative therapy and I think this is appropriate. I don't think he would do well with an open repair. Quality of life is poor currently.

## 2013-07-14 ENCOUNTER — Other Ambulatory Visit: Payer: Self-pay | Admitting: *Deleted

## 2013-07-14 ENCOUNTER — Other Ambulatory Visit: Payer: Self-pay | Admitting: Cardiology

## 2013-07-14 MED ORDER — RAMIPRIL 5 MG PO CAPS: 5.0000 mg | ORAL_CAPSULE | Freq: Every day | ORAL | Status: DC

## 2013-09-05 ENCOUNTER — Other Ambulatory Visit: Payer: Self-pay

## 2013-09-05 MED ORDER — RAMIPRIL 5 MG PO CAPS: 5.0000 mg | ORAL_CAPSULE | Freq: Every day | ORAL | Status: DC

## 2013-09-08 ENCOUNTER — Other Ambulatory Visit: Payer: Self-pay | Admitting: *Deleted

## 2013-09-08 MED ORDER — LEVOTHYROXINE SODIUM 25 MCG PO TABS: 25.0000 ug | ORAL_TABLET | Freq: Every day | ORAL | Status: DC

## 2014-01-02 ENCOUNTER — Ambulatory Visit: Payer: Medicare HMO | Admitting: Cardiology

## 2014-01-12 ENCOUNTER — Other Ambulatory Visit: Payer: Self-pay

## 2014-01-12 MED ORDER — LEVOTHYROXINE SODIUM 25 MCG PO TABS: 25.0000 ug | ORAL_TABLET | Freq: Every day | ORAL | Status: DC

## 2014-01-15 ENCOUNTER — Ambulatory Visit (INDEPENDENT_AMBULATORY_CARE_PROVIDER_SITE_OTHER): Payer: Medicare HMO | Admitting: Physician Assistant

## 2014-01-15 ENCOUNTER — Encounter: Payer: Self-pay | Admitting: Physician Assistant

## 2014-01-15 VITALS — BP 130/80 | Ht 71.0 in | Wt 177.6 lb

## 2014-01-15 DIAGNOSIS — I714 Abdominal aortic aneurysm, without rupture, unspecified: Secondary | ICD-10-CM

## 2014-01-15 DIAGNOSIS — I10 Essential (primary) hypertension: Secondary | ICD-10-CM

## 2014-01-15 DIAGNOSIS — Z954 Presence of other heart-valve replacement: Secondary | ICD-10-CM

## 2014-01-15 DIAGNOSIS — I2583 Coronary atherosclerosis due to lipid rich plaque: Secondary | ICD-10-CM

## 2014-01-15 DIAGNOSIS — R040 Epistaxis: Secondary | ICD-10-CM

## 2014-01-15 DIAGNOSIS — Z952 Presence of prosthetic heart valve: Secondary | ICD-10-CM

## 2014-01-15 DIAGNOSIS — I2510 Atherosclerotic heart disease of native coronary artery without angina pectoris: Secondary | ICD-10-CM

## 2014-01-15 DIAGNOSIS — I480 Paroxysmal atrial fibrillation: Secondary | ICD-10-CM

## 2014-01-15 NOTE — Assessment & Plan Note (Signed)
Status post stent graft with subsequent type II leak. Not a candidate for percutaneous intervention. He has opted for conservative therapy.  No anticoagulation for atrial fibrillation due to this.

## 2014-01-15 NOTE — Assessment & Plan Note (Signed)
No complaints of angina. 

## 2014-01-15 NOTE — Patient Instructions (Signed)
Your physician recommends that you schedule a follow-up appointment in: 6 months with Dr. Jordan 

## 2014-01-15 NOTE — Assessment & Plan Note (Signed)
Blood pressures controlled. 

## 2014-01-15 NOTE — Assessment & Plan Note (Signed)
He has maintained regular rate and rhythm on exam

## 2014-01-15 NOTE — Progress Notes (Signed)
Patient ID: EYAN HAGOOD, male   DOB: 07/16/1926, 80 y.o.   MRN: [id number]     Date:  01/15/2014   ID:  EDGARD DEBORD, DOB [birth year], MRN [id number]  PCP:  Tamsen Roers, MD  Primary Cardiologist:  [place]    History of Present Illness: ARIQ KHAMIS is a 80 y.o. male followed by Dr.Jordan. He has a history of coronary disease and is status post CABG in 2000. Cardiac catheterization September 2012 showed patent grafts. He developed severe aortic stenosis and underwent aortic valve replacement with a tissue prosthesis. Prior to surgery he had congestive heart fair with ejection fraction 25%. Postoperatively his ejection fraction improved to 45%. He did have atrial fibrillation during that time and was placed on amiodarone and Coumadin. He was cardioverted in October of 2012. He is now off of amiodarone. He also has an abdominal aortic aneurysm and is status post stent grafting in 2009. He has a history of type II endoleak and had a coil procedure on one occasion. Followup reveals persistent type II endoleak from 2 lumbar arteries. This is being managed conservatively.   On follow up today he complains that he generally feels bad. He can't get around very well. His right knee bothers him a lot. He does still ride his exercise bike.  His wife reports he has been having persistent nosebleeds. Sometimes twice a day. The patient however, states it is not that bad. He reports gaining weight and that he drinks 2-3 Budweisers per day. He denies shortness of breath beyond baseline, orthopnea or lower extremity edema.  He also denies nausea, vomiting, fever, chest pain, dizziness, PND, cough, congestion, abdominal pain, hematochezia, melena, claudication.    Wt Readings from Last 3 Encounters:  01/15/14 177 lb 9.6 oz (80.559 kg)  06/30/13 177 lb (80.287 kg)  12/13/12 166 lb (75.297 kg)     Past Medical History  Diagnosis Date  . Atrial fibrillation     s/p cardioversion October 2012  .  Hypertension   . AAA (abdominal aortic aneurysm)     s/p stent graft in September 2009 & with attempted embolization/occlusion of vessels for a type 2 leak around the stent graft; Managed by Dr. Oneida Alar; last CT in 2012 showing the leak had closed.   [name] History of carotid stenosis     s/p L CEA in September 2006  . Gout   . Arthritis   . Hyperlipidemia   . Wears glasses   . Coronary artery disease     Remote CABG x 5 in 2000  . Aortic stenosis     s/p AVR in September 2012 per Dr. Cyndia Bent  . PVC's (premature ventricular contractions)   . PAT (paroxysmal atrial tachycardia)   . Inguinal hernia     RIH  . Emphysema of lung   . High risk medication use     on amiodarone  . Chronic anticoagulation   . Gallstones     s/p cholecystectomy in Dec 2012  . Stroke 2006    three strokes -no residuals  . Urinary frequency     at night  . Anemia     on iron pills  . CHF (congestive heart failure)     SEPT AND OCT 2012; follow up echo in October shows EF of 50%.     Current Outpatient Prescriptions  Medication Sig Dispense Refill  . aspirin EC 81 MG tablet Take 81 mg by mouth daily.    . carvedilol (COREG) 6.25 MG tablet Take  1 tablet (6.25 mg total) by mouth 2 (two) times daily with a meal. 60 tablet 11  . dorzolamide-timolol (COSOPT) 22.3-6.8 MG/ML ophthalmic solution 1 drop 2 (two) times daily.    [name] latanoprost (XALATAN) 0.005 % ophthalmic solution Place 1 drop into both eyes at bedtime.    [name] levothyroxine (SYNTHROID, LEVOTHROID) 25 MCG tablet Take 1 tablet (25 mcg total) by mouth daily before breakfast. 30 tablet 3  . Multiple Vitamin (MULTIVITAMIN) tablet Take 2 tablets by mouth daily.     . ramipril (ALTACE) 5 MG capsule Take 1 capsule (5 mg total) by mouth daily. 90 capsule 2   No current facility-administered medications for this visit.    Allergies:   No Known Allergies  Social History:  The patient  reports that he has been smoking Cigars.  He has never used smokeless tobacco.  He reports that he drinks about 12.6 oz of alcohol per week. He reports that he does not use illicit drugs.   Family history:   Family History  Problem Relation Age of Onset  . Other Mother     falopian tube during pregnancy   . Heart disease Father   . Cancer Brother     liver     ROS:  Please see the history of present illness.  All other systems reviewed and negative.   PHYSICAL EXAM: VS:  BP 130/80 mmHg  Ht 5\' 11"  (1.803 m)  Wt 177 lb 9.6 oz (80.559 kg)  BMI 24.78 kg/m2 Well nourished, well developed, in no acute distress HEENT: Pupils are equal round react to light accommodation extraocular movements are intact.  Neck: no JVDNo cervical lymphadenopathy. Cardiac: Regular rate and rhythm split S2 without murmurs rubs or gallops. Lungs:  clear to auscultation bilaterally, no wheezing, rhonchi or rales Abd: soft, nontender, positive bowel sounds all quadrants, no hepatosplenomegaly Ext: no lower extremity edema.  2+ radial and dorsalis pedis pulses. Skin: warm and dry Neuro:  Grossly normal  EKG:  None  ASSESSMENT AND PLAN:  Problem List Items Addressed This Visit    AAA (abdominal aortic aneurysm)    Status post stent graft with subsequent type II leak. Not a candidate for percutaneous intervention. He has opted for conservative therapy.  No anticoagulation for atrial fibrillation due to this.    Atrial fibrillation - Primary    He is maintaining regular rate and rhythm on exam    Coronary artery disease    No complaints of angina    Hypertension    Blood pressures controlled.    S/P AVR (aortic valve replacement)    Asymptomatic.         Epistaxis: Continue to monitor.  If it persists, follow up with PCP or ENT.

## 2014-01-15 NOTE — Assessment & Plan Note (Signed)
Asymptomatic. 

## 2014-01-15 NOTE — Assessment & Plan Note (Signed)
Continue to monitor.  If it persists, follow up with PCP or ENT.

## 2014-01-22 ENCOUNTER — Encounter (HOSPITAL_COMMUNITY): Payer: Self-pay | Admitting: Vascular Surgery

## 2014-02-16 ENCOUNTER — Telehealth: Payer: Self-pay | Admitting: Cardiology

## 2014-02-16 NOTE — Telephone Encounter (Signed)
Pt need to change pharmacy because of his insurance. Please call his Levothyroxine 25 mcg #30 to [facility]-[phone].

## 2014-02-17 ENCOUNTER — Other Ambulatory Visit: Payer: Self-pay | Admitting: *Deleted

## 2014-02-17 MED ORDER — LEVOTHYROXINE SODIUM 25 MCG PO TABS: 25.0000 ug | ORAL_TABLET | Freq: Every day | ORAL | Status: DC

## 2014-05-08 ENCOUNTER — Telehealth: Payer: Self-pay | Admitting: Cardiology

## 2014-05-08 NOTE — Telephone Encounter (Signed)
Close encounter 

## 2014-06-18 ENCOUNTER — Telehealth: Payer: Self-pay | Admitting: Cardiology

## 2014-06-18 NOTE — Telephone Encounter (Signed)
°  1. Which medications need to be refilled? Levothyroxin  2. Which pharmacy is medication to be sent to? [facility]-[phone]  3. Do they need a 30 day or 90 day supply? 30 and refills  4. Would they like a call back once the medication has been sent to the pharmacy? yes

## 2014-06-19 ENCOUNTER — Other Ambulatory Visit: Payer: Self-pay

## 2014-06-19 MED ORDER — LEVOTHYROXINE SODIUM 25 MCG PO TABS
25.0000 ug | ORAL_TABLET | Freq: Every day | ORAL | Status: DC
Start: 2014-06-19 — End: 2014-10-20

## 2014-06-22 ENCOUNTER — Telehealth: Payer: Self-pay | Admitting: Cardiology

## 2014-06-22 NOTE — Telephone Encounter (Signed)
°  1. Which medications need to be refilled? Ramipril  2. Which pharmacy is medication to be sent to?[phone]  3. Do they need a 30 day or 90 day supply? 90 amd refills  4. Would they like a call back once the medication has been sent to the pharmacy? yes

## 2014-06-23 ENCOUNTER — Other Ambulatory Visit: Payer: Self-pay | Admitting: *Deleted

## 2014-06-23 MED ORDER — RAMIPRIL 5 MG PO CAPS: 5.0000 mg | ORAL_CAPSULE | Freq: Every day | ORAL | Status: DC

## 2014-07-14 ENCOUNTER — Telehealth: Payer: Self-pay | Admitting: Cardiology

## 2014-07-14 ENCOUNTER — Other Ambulatory Visit: Payer: Self-pay | Admitting: *Deleted

## 2014-07-14 MED ORDER — CARVEDILOL 6.25 MG PO TABS: 6.2500 mg | ORAL_TABLET | Freq: Two times a day (BID) | ORAL | Status: DC

## 2014-07-14 NOTE — Telephone Encounter (Signed)
°  1. Which medications need to be refilled? Carvedilol 6.25mg  takes twice a day   2. Which pharmacy is medication to be sent to? [facility] on [address] -[phone]  3. Do they need a 30 day or 90 day supply? 90  4. Would they like a call back once the medication has been sent to the pharmacy? Yes

## 2014-07-14 NOTE — Telephone Encounter (Signed)
Coreg refilled per request

## 2014-07-20 ENCOUNTER — Encounter: Payer: Self-pay | Admitting: Cardiology

## 2014-07-20 ENCOUNTER — Ambulatory Visit (INDEPENDENT_AMBULATORY_CARE_PROVIDER_SITE_OTHER): Payer: Medicare HMO | Admitting: Cardiology

## 2014-07-20 VITALS — BP 148/62 | HR 52 | Ht 71.0 in | Wt 174.9 lb

## 2014-07-20 DIAGNOSIS — I714 Abdominal aortic aneurysm, without rupture, unspecified: Secondary | ICD-10-CM

## 2014-07-20 DIAGNOSIS — I10 Essential (primary) hypertension: Secondary | ICD-10-CM

## 2014-07-20 DIAGNOSIS — Z954 Presence of other heart-valve replacement: Secondary | ICD-10-CM

## 2014-07-20 DIAGNOSIS — I420 Dilated cardiomyopathy: Secondary | ICD-10-CM | POA: Diagnosis not present

## 2014-07-20 DIAGNOSIS — I25118 Atherosclerotic heart disease of native coronary artery with other forms of angina pectoris: Secondary | ICD-10-CM

## 2014-07-20 DIAGNOSIS — Z952 Presence of prosthetic heart valve: Secondary | ICD-10-CM

## 2014-07-20 NOTE — Patient Instructions (Signed)
Continue your current therapy  I will see you in 6 months.   

## 2014-07-21 NOTE — Progress Notes (Signed)
[name] Date of Birth: 04/21/1926 Medical Record #[id number]  History of Present Illness: [name] is here today for follow up CAD and AV disease.  He has a history of coronary disease and is status post CABG in 2000. Cardiac catheterization September 2012 showed patent grafts. He developed severe aortic stenosis and underwent aortic valve replacement with a tissue prosthesis. Prior to surgery he had congestive heart failure with ejection fraction 25%. Postoperatively his ejection fraction improved to 45%. He did have atrial fibrillation during that time and was placed on amiodarone and Coumadin. He was cardioverted in October of 2012. He has been off of amiodarone. He also has an abdominal aortic aneurysm and is status post stent grafting in 2009. He has a history of type II endoleak and had a coil procedure on one occasion. 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 This is being managed conservatively.  On follow up today he has multiple chronic complaints. He can't get around very well. His right knee bothers him a lot.  He has some chronic ankle edema R>L. He is very sedentary and does get out of breath just walking room to room.  He is frustrated with his limitations now and states he generally feels like "hell". His wife thinks he did get better with B 12 shots. Labs are followed by Dr. Tamsen Roers.   Current Outpatient Prescriptions on File Prior to Visit  Medication Sig Dispense Refill  . aspirin EC 81 MG tablet Take 81 mg by mouth daily.    . carvedilol (COREG) 6.25 MG tablet Take 1 tablet (6.25 mg total) by mouth 2 (two) times daily with a meal. 180 tablet 3  . dorzolamide-timolol (COSOPT) 22.3-6.8 MG/ML ophthalmic solution 1 drop 2 (two) times daily.    [name] latanoprost (XALATAN) 0.005 % ophthalmic solution Place 1 drop into both eyes at bedtime.    [name] levothyroxine (SYNTHROID, LEVOTHROID) 25 MCG tablet Take 1 tablet (25 mcg total) by mouth daily  before breakfast. 30 tablet 3  . Multiple Vitamin (MULTIVITAMIN) tablet Take 2 tablets by mouth daily.     . ramipril (ALTACE) 5 MG capsule Take 1 capsule (5 mg total) by mouth daily. 90 capsule 1   No current facility-administered medications on file prior to visit.    No Known Allergies  Past Medical History  Diagnosis Date  . Atrial fibrillation     s/p cardioversion October 2012  . Hypertension   . AAA (abdominal aortic aneurysm)     s/p stent graft in September 2009 & with attempted embolization/occlusion of vessels for a type 2 leak around the stent graft; Managed by Dr. Oneida Alar; last CT in 2012 showing the leak had closed.   [name] History of carotid stenosis     s/p L CEA in September 2006  . Gout   . Arthritis   . Hyperlipidemia   . Wears glasses   . Coronary artery disease     Remote CABG x 5 in 2000  . Aortic stenosis     s/p AVR in September 2012 per Dr. Cyndia Bent  . PVC's (premature ventricular contractions)   . PAT (paroxysmal atrial tachycardia)   . Inguinal hernia     RIH  . Emphysema of lung   . High risk medication use     on amiodarone  . Chronic anticoagulation   . Gallstones     s/p cholecystectomy in Dec 2012  . Stroke 2006    three strokes -no  residuals  . Urinary frequency     at night  . Anemia     on iron pills  . CHF (congestive heart failure)     SEPT AND OCT 2012; follow up echo in October shows EF of 50%.     Past Surgical History  Procedure Laterality Date  . Tonsillectomy    . Joint replacement      Right TKR  . Abdominal aortic aneurysm repair  2008    Gore Excluder Stent Graft repair  . Carotid endarterectomy  2006    Left Side  . Hemorrhoid surgery    . Replacement total knee  2008  . Abdominal aortic aneurysm repair  2010/2012  . Cardiac catheterization  04/29/1998    EF 60%  . US echocardiography  08/18/2009    EF 55-60%  . US echocardiography  04/22/2007    EF 55-60%  . Cardiovascular stress test  08/19/2009  . Aortic valve  replacement  10/24/10    AVR  #25 mm Magna Ease pericardial valve  . Hernia repair  10/13/10    RIH  . Colon surgery      lap right colon  . Hemorrhoid surgery    . Coronary artery bypass graft  1970    bypass and open heart surgery--pt states this is incorrect--his only cabg was in 2000  . Coronary artery bypass graft  2000    5 vessel BY DR.BARTEL. LIMA GRAFT TO THE LAD, SEQUENTIAL SAPHENOUS VEIN GRAFT TO THE  FIRST DIAGONAL AND FIRST OBTUSE MARGINAL VESSELS, SAPHENOUS VEIN GRAFT TO THE SECOND OBTUSE MARGINAL VESSEL, AND SAPHENOUS VEIN GRAFT TO THE PDA  . Eye surgery      bilateral cataract extractions  . Cholecystectomy  01/23/2011    Procedure: LAPAROSCOPIC CHOLECYSTECTOMY WITH INTRAOPERATIVE CHOLANGIOGRAM;  Surgeon: Rolm Bookbinder, MD;  Location: [facility];  Service: General;  Laterality: N/A;  . Abdominal aortagram N/A 12/25/2011    Procedure: ABDOMINAL Maxcine Ham;  Surgeon: Elam Dutch, MD;  Location: [facility];  Service: Cardiovascular;  Laterality: N/A;    History  Smoking status  . Current Every Day Smoker  . Types: Cigars  Smokeless tobacco  . Never Used    Comment: pt states he smokes 3-4 cigars every day    History  Alcohol Use  . 12.6 oz/week  . 21 Cans of beer per week    Family History  Problem Relation Age of Onset  . Other Mother     falopian tube during pregnancy   . Heart disease Father   . Cancer Brother     liver     Review of Systems: As noted in history of present illness. All other systems were reviewed and are negative.  Physical Exam: BP 148/62 mmHg  Pulse 52  Ht 5\' 11"  (1.803 m)  Wt 79.334 kg (174 lb 14.4 oz)  BMI 24.40 kg/m2 Patient is  Pleasant, elderly and appears chronically ill. Skin is warm and dry. Color is normal.  HEENT is unremarkable. Normocephalic/atraumatic. PERRL. Sclera are nonicteric. Neck is supple. No masses. No JVD. Lungs are clear. Cardiac exam shows a regular rate and rhythm. His aortic valve sound is normal.  There is a soft systolic ejection murmur. No gallop or diastolic murmur. Abdomen is soft. Extremities reveal 1+ right ankle edema..  Gait and ROM are intact. No gross neurologic deficits noted.  LABORATORY DATA:  Ecg shows atrial fibrillation with rate 52 bpm. LAD, LBBB. I have personally reviewed and interpreted this study.  Assessment / Plan: 1. Aortic stenosis status post tissue aortic valve replacement in September 2012. Asymptomatic.  2. Coronary disease status post CABG in 2000. Cardiac catheterization 2012 year showed patent grafts. No active angina.   3. Congestive heart failure, chronic systolic. Last ejection fraction of 45%. He appears to be well compensated on combination of carvedilol, Lasix, and Altace. Weight is stable. Minimal edema. Lungs are clear. Continue current therapy.  4. Atrial fibrillation. He is back in Afib now. Rate controlled. Have not recommended long-term anticoagulation given his abdominal aneurysm with persistent endoleak. He really doesn't want to pursue more aggressive therapy.  5. Spinal stenosis. Status post surgery.  6. Abdominal aortic aneurysm. Status post stent graft with subsequent  type II leak. Not a candidate for percutaneous intervention. He has opted for conservative therapy and I think this is appropriate. I don't think he would do well with an open repair. Quality of life is poor currently.  7. Chronic depression. Mentions often that he is ready to check out. Quality of life is very poor and unlikely to improve.

## 2014-07-27 ENCOUNTER — Other Ambulatory Visit: Payer: Self-pay | Admitting: Family Medicine

## 2014-07-27 ENCOUNTER — Ambulatory Visit
Admission: RE | Admit: 2014-07-27 | Discharge: 2014-07-27 | Disposition: A | Discharge status: Home / Self Care | Payer: Medicare HMO | Source: Ambulatory Visit | Attending: Family Medicine | Admitting: Family Medicine

## 2014-07-27 DIAGNOSIS — M545 Low back pain: Secondary | ICD-10-CM

## 2014-07-27 DIAGNOSIS — R091 Pleurisy: Secondary | ICD-10-CM

## 2014-09-07 ENCOUNTER — Inpatient Hospital Stay (HOSPITAL_COMMUNITY)
Admission: EM | Admit: 2014-09-07 | Discharge: 2014-09-11 | DRG: 292 | Disposition: A | Discharge status: Home / Self Care | Payer: Medicare HMO | Attending: Internal Medicine | Admitting: Internal Medicine

## 2014-09-07 ENCOUNTER — Emergency Department (HOSPITAL_COMMUNITY): Payer: Medicare HMO

## 2014-09-07 ENCOUNTER — Encounter (HOSPITAL_COMMUNITY): Payer: Self-pay | Admitting: Emergency Medicine

## 2014-09-07 DIAGNOSIS — I2510 Atherosclerotic heart disease of native coronary artery without angina pectoris: Secondary | ICD-10-CM | POA: Diagnosis present

## 2014-09-07 DIAGNOSIS — M109 Gout, unspecified: Secondary | ICD-10-CM | POA: Diagnosis present

## 2014-09-07 DIAGNOSIS — R579 Shock, unspecified: Secondary | ICD-10-CM | POA: Diagnosis present

## 2014-09-07 DIAGNOSIS — I714 Abdominal aortic aneurysm, without rupture: Secondary | ICD-10-CM | POA: Diagnosis present

## 2014-09-07 DIAGNOSIS — K068 Other specified disorders of gingiva and edentulous alveolar ridge: Secondary | ICD-10-CM | POA: Diagnosis present

## 2014-09-07 DIAGNOSIS — Z9841 Cataract extraction status, right eye: Secondary | ICD-10-CM

## 2014-09-07 DIAGNOSIS — I5043 Acute on chronic combined systolic (congestive) and diastolic (congestive) heart failure: Principal | ICD-10-CM | POA: Diagnosis present

## 2014-09-07 DIAGNOSIS — I471 Supraventricular tachycardia: Secondary | ICD-10-CM | POA: Diagnosis present

## 2014-09-07 DIAGNOSIS — Z953 Presence of xenogenic heart valve: Secondary | ICD-10-CM

## 2014-09-07 DIAGNOSIS — Z7982 Long term (current) use of aspirin: Secondary | ICD-10-CM

## 2014-09-07 DIAGNOSIS — F17210 Nicotine dependence, cigarettes, uncomplicated: Secondary | ICD-10-CM | POA: Diagnosis present

## 2014-09-07 DIAGNOSIS — I350 Nonrheumatic aortic (valve) stenosis: Secondary | ICD-10-CM | POA: Diagnosis present

## 2014-09-07 DIAGNOSIS — Z9842 Cataract extraction status, left eye: Secondary | ICD-10-CM

## 2014-09-07 DIAGNOSIS — M1990 Unspecified osteoarthritis, unspecified site: Secondary | ICD-10-CM | POA: Diagnosis present

## 2014-09-07 DIAGNOSIS — Z98818 Other dental procedure status: Secondary | ICD-10-CM

## 2014-09-07 DIAGNOSIS — Z96651 Presence of right artificial knee joint: Secondary | ICD-10-CM | POA: Diagnosis present

## 2014-09-07 DIAGNOSIS — I509 Heart failure, unspecified: Secondary | ICD-10-CM

## 2014-09-07 DIAGNOSIS — Z8673 Personal history of transient ischemic attack (TIA), and cerebral infarction without residual deficits: Secondary | ICD-10-CM

## 2014-09-07 DIAGNOSIS — I5022 Chronic systolic (congestive) heart failure: Secondary | ICD-10-CM | POA: Diagnosis present

## 2014-09-07 DIAGNOSIS — Z951 Presence of aortocoronary bypass graft: Secondary | ICD-10-CM

## 2014-09-07 DIAGNOSIS — E785 Hyperlipidemia, unspecified: Secondary | ICD-10-CM | POA: Diagnosis present

## 2014-09-07 DIAGNOSIS — Z952 Presence of prosthetic heart valve: Secondary | ICD-10-CM

## 2014-09-07 DIAGNOSIS — R0602 Shortness of breath: Secondary | ICD-10-CM | POA: Diagnosis not present

## 2014-09-07 DIAGNOSIS — I10 Essential (primary) hypertension: Secondary | ICD-10-CM

## 2014-09-07 DIAGNOSIS — I160 Hypertensive urgency: Secondary | ICD-10-CM | POA: Diagnosis present

## 2014-09-07 DIAGNOSIS — I5023 Acute on chronic systolic (congestive) heart failure: Secondary | ICD-10-CM | POA: Diagnosis present

## 2014-09-07 DIAGNOSIS — I480 Paroxysmal atrial fibrillation: Secondary | ICD-10-CM | POA: Diagnosis present

## 2014-09-07 DIAGNOSIS — I482 Chronic atrial fibrillation, unspecified: Secondary | ICD-10-CM | POA: Diagnosis present

## 2014-09-07 DIAGNOSIS — Z7901 Long term (current) use of anticoagulants: Secondary | ICD-10-CM

## 2014-09-07 DIAGNOSIS — Z9049 Acquired absence of other specified parts of digestive tract: Secondary | ICD-10-CM | POA: Diagnosis present

## 2014-09-07 LAB — CBC WITH DIFFERENTIAL/PLATELET
Basophils Absolute: 0 10*3/uL (ref 0.0–0.1)
Basophils Relative: 0 % (ref 0–1)
EOS ABS: 0.1 10*3/uL (ref 0.0–0.7)
EOS PCT: 2 % (ref 0–5)
HCT: 39.3 % (ref 39.0–52.0)
HEMOGLOBIN: 13.7 g/dL (ref 13.0–17.0)
LYMPHS ABS: 0.9 10*3/uL (ref 0.7–4.0)
Lymphocytes Relative: 16 % (ref 12–46)
MCH: 34.5 pg — ABNORMAL HIGH (ref 26.0–34.0)
MCHC: 34.9 g/dL (ref 30.0–36.0)
MCV: 99 fL (ref 78.0–100.0)
Monocytes Absolute: 0.5 10*3/uL (ref 0.1–1.0)
Monocytes Relative: 9 % (ref 3–12)
NEUTROS ABS: 4.3 10*3/uL (ref 1.7–7.7)
Neutrophils Relative %: 73 % (ref 43–77)
PLATELETS: 130 10*3/uL — AB (ref 150–400)
RBC: 3.97 MIL/uL — AB (ref 4.22–5.81)
RDW: 13.6 % (ref 11.5–15.5)
WBC: 5.9 10*3/uL (ref 4.0–10.5)

## 2014-09-07 LAB — COMPREHENSIVE METABOLIC PANEL
ALBUMIN: 4.4 g/dL (ref 3.5–5.0)
ALK PHOS: 63 U/L (ref 38–126)
ALT: 18 U/L (ref 17–63)
AST: 26 U/L (ref 15–41)
Anion gap: 8 (ref 5–15)
BUN: 11 mg/dL (ref 6–20)
CHLORIDE: 104 mmol/L (ref 101–111)
CO2: 25 mmol/L (ref 22–32)
Calcium: 10.2 mg/dL (ref 8.9–10.3)
Creatinine, Ser: 1.01 mg/dL (ref 0.61–1.24)
GFR calc Af Amer: >60 mL/min (ref >60)
GFR calc non Af Amer: >60 mL/min (ref >60)
Glucose, Bld: 93 mg/dL (ref 65–99)
POTASSIUM: 4.2 mmol/L (ref 3.5–5.1)
SODIUM: 137 mmol/L (ref 135–145)
Total Bilirubin: 1.9 mg/dL — ABNORMAL HIGH (ref 0.3–1.2)
Total Protein: 6.8 g/dL (ref 6.5–8.1)

## 2014-09-07 LAB — TROPONIN I: Troponin I: <0.03 ng/mL (ref <0.031)

## 2014-09-07 LAB — BRAIN NATRIURETIC PEPTIDE: B NATRIURETIC PEPTIDE 5: 1227.4 pg/mL — AB (ref 0.0–100.0)

## 2014-09-07 MED ORDER — SODIUM CHLORIDE 0.9 % IV SOLN
Freq: Once | INTRAVENOUS | Status: AC
  Administered 2014-09-07: 1000 mL via INTRAVENOUS

## 2014-09-07 NOTE — ED Notes (Signed)
Pt ambulated and SPO2 initially remained between 98-100%. His respirations started to increase halfway through ambulation. Pt became tachycardic at a rate of 140. His SPO2 dropped to 88%. He stopped to rest for 1 minute. His respirations decreased along with his HR and SPO2 remained at 94% for the rest of ambulation. Pt remained CAOx4 throughout and skin was pink/dry throughout as well.

## 2014-09-07 NOTE — ED Notes (Signed)
Patient transported to X-ray 

## 2014-09-07 NOTE — ED Notes (Addendum)
The patient said he oral surgery on Tuesday of last week.   The patient said he has been hurting like "hell" and has not been able to sleep.  He also said the pain takes his breath away.  The patient's wife said he is not following the MDs instructions for cleaning his mouth.  He also has not been taking his pain medication like he should.  He is here to be evaluated due to the wife having concerns for infection.  He rates his pain  0/10.  He is not having any now.

## 2014-09-07 NOTE — ED Provider Notes (Signed)
CSN: [id number]     Arrival date & time 09/07/14  1610 History   First MD Initiated Contact with Patient 09/07/14 1956     Chief Complaint  Patient presents with  . Post-op Problem    The patient said he oral surgery on Tuesday of last week.   The patient said he has been hurting like "hell" and has not been able to sleep.  He also said the pain takes his breath away.   . Dental Pain     (Consider location/radiation/quality/duration/timing/severity/associated sxs/prior Treatment) HPI Comments: This is a frail-appearing 81 year old male who recently had oral surgery.  He states he's not had any pain at the surgical site for several days but has been having intermittent episodes of shortness of breath and chest discomfort.  This is not exacerbated by position or activity. Initially after the surgery.  He did not feel well.  He had poor appetite and ate and drank very little, but this has improved over the past couple days.  He has an extensive cardiac history followed by Dr. Martinique.  He was seen by Dr. Martinique approximately one week ago prior to his oral surgery.  He did receive an IV dose of anti-biotics.  Prior to the surgery  Patient is a 81 y.o. male presenting with tooth pain and shortness of breath. The history is provided by the patient and the spouse.  Dental Pain Location:  Lower Quality:  No pain Severity:  No pain Onset quality:  Gradual Progression:  Resolved Chronicity:  New Context: recent dental surgery   Relieved by:  None tried Worsened by:  Nothing tried Ineffective treatments:  None tried Associated symptoms: no drooling, no facial pain, no facial swelling, no fever, no gum swelling, no headaches, no neck pain, no neck swelling and no oral bleeding   Shortness of Breath Severity:  Moderate Onset quality:  Gradual Timing:  Intermittent Progression:  Waxing and waning Chronicity:  Recurrent Context: not activity   Relieved by:  Nothing Worsened by:  Nothing  tried Ineffective treatments:  None tried Associated symptoms: chest pain   Associated symptoms: no cough, no ear pain, no fever, no headaches, no neck pain and no vomiting     Past Medical History  Diagnosis Date  . Atrial fibrillation     s/p cardioversion October 2012  . Hypertension   . AAA (abdominal aortic aneurysm)     s/p stent graft in September 2009 & with attempted embolization/occlusion of vessels for a type 2 leak around the stent graft; Managed by Dr. Oneida Alar; last CT in 2012 showing the leak had closed.   [name] History of carotid stenosis     s/p L CEA in September 2006  . Gout   . Arthritis   . Hyperlipidemia   . Wears glasses   . Coronary artery disease     Remote CABG x 5 in 2000  . Aortic stenosis     s/p AVR in September 2012 per Dr. Cyndia Bent  . PVC's (premature ventricular contractions)   . PAT (paroxysmal atrial tachycardia)   . Inguinal hernia     RIH  . Emphysema of lung   . High risk medication use     on amiodarone  . Chronic anticoagulation   . Gallstones     s/p cholecystectomy in Dec 2012  . Stroke 2006    three strokes -no residuals  . Urinary frequency     at night  . Anemia     on iron  pills  . CHF (congestive heart failure)     SEPT AND OCT 2012; follow up echo in October shows EF of 50%.    Past Surgical History  Procedure Laterality Date  . Tonsillectomy    . Joint replacement      Right TKR  . Abdominal aortic aneurysm repair  2008    Gore Excluder Stent Graft repair  . Carotid endarterectomy  2006    Left Side  . Hemorrhoid surgery    . Replacement total knee  2008  . Abdominal aortic aneurysm repair  2010/2012  . Cardiac catheterization  04/29/1998    EF 60%  . US echocardiography  08/18/2009    EF 55-60%  . US echocardiography  04/22/2007    EF 55-60%  . Cardiovascular stress test  08/19/2009  . Aortic valve replacement  10/24/10    AVR  #25 mm Magna Ease pericardial valve  . Hernia repair  10/13/10    RIH  . Colon surgery       lap right colon  . Hemorrhoid surgery    . Coronary artery bypass graft  1970    bypass and open heart surgery--pt states this is incorrect--his only cabg was in 2000  . Coronary artery bypass graft  2000    5 vessel BY DR.BARTEL. LIMA GRAFT TO THE LAD, SEQUENTIAL SAPHENOUS VEIN GRAFT TO THE  FIRST DIAGONAL AND FIRST OBTUSE MARGINAL VESSELS, SAPHENOUS VEIN GRAFT TO THE SECOND OBTUSE MARGINAL VESSEL, AND SAPHENOUS VEIN GRAFT TO THE PDA  . Eye surgery      bilateral cataract extractions  . Cholecystectomy  01/23/2011    Procedure: LAPAROSCOPIC CHOLECYSTECTOMY WITH INTRAOPERATIVE CHOLANGIOGRAM;  Surgeon: Rolm Bookbinder, MD;  Location: [facility];  Service: General;  Laterality: N/A;  . Abdominal aortagram N/A 12/25/2011    Procedure: ABDOMINAL Maxcine Ham;  Surgeon: Elam Dutch, MD;  Location: [facility] CATH LAB;  Service: Cardiovascular;  Laterality: N/A;   Family History  Problem Relation Age of Onset  . Other Mother     falopian tube during pregnancy   . Heart disease Father   . Cancer Brother     liver    History  Substance Use Topics  . Smoking status: Current Every Day Smoker    Types: Cigars  . Smokeless tobacco: Never Used     Comment: pt states he smokes 3-4 cigars every day  . Alcohol Use: 12.6 oz/week    21 Cans of beer per week    Review of Systems  Constitutional: Positive for appetite change. Negative for fever and chills.  HENT: Negative for dental problem, drooling, ear pain and facial swelling.   Respiratory: Positive for shortness of breath. Negative for cough.   Cardiovascular: Positive for chest pain and leg swelling.  Gastrointestinal: Negative for nausea and vomiting.  Genitourinary: Negative for dysuria.  Musculoskeletal: Negative for neck pain.  Skin: Positive for pallor.  Neurological: Negative for dizziness and headaches.  All other systems reviewed and are negative.     Allergies  Review of patient's allergies indicates no known allergies.  Home  Medications   Prior to Admission medications   Medication Sig Start Date End Date Taking? Authorizing Provider  aspirin EC 81 MG tablet Take 81 mg by mouth daily.   Yes Historical Provider, MD  carvedilol (COREG) 6.25 MG tablet Take 1 tablet (6.25 mg total) by mouth 2 (two) times daily with a meal. 07/14/14  Yes Peter M Martinique, MD  dorzolamide-timolol (COSOPT) 22.3-6.8 MG/ML ophthalmic solution 1  drop 2 (two) times daily.   Yes Historical Provider, MD  latanoprost (XALATAN) 0.005 % ophthalmic solution Place 1 drop into both eyes at bedtime.   Yes Historical Provider, MD  levothyroxine (SYNTHROID, LEVOTHROID) 25 MCG tablet Take 1 tablet (25 mcg total) by mouth daily before breakfast. 06/19/14  Yes Peter M Martinique, MD  Multiple Vitamin (MULTIVITAMIN) tablet Take 1 tablet by mouth 2 (two) times daily.    Yes Historical Provider, MD  ramipril (ALTACE) 5 MG capsule Take 1 capsule (5 mg total) by mouth daily. 06/23/14  Yes Peter M Martinique, MD  traMADol (ULTRAM) 50 MG tablet Take 50 mg by mouth as needed. FOR PAIN 08/31/14  Yes Historical Provider, MD   BP 199/96 mmHg  Pulse 75  Temp(Src) 97.6 F (36.4 C) (Axillary)  Resp 25  Ht 5\' 11"  (1.803 m)  Wt 175 lb (79.379 kg)  BMI 24.42 kg/m2  SpO2 97% Physical Exam  Constitutional: He is oriented to person, place, and time. He appears well-developed and well-nourished.  HENT:  Head: Normocephalic.  Mouth/Throat:    Eyes: Pupils are equal, round, and reactive to light.  Neck: Normal range of motion.  Cardiovascular: Normal rate and regular rhythm.   Pulmonary/Chest: Effort normal and breath sounds normal. No respiratory distress. He has no wheezes. He exhibits no tenderness.  Abdominal: Soft. Bowel sounds are normal.  Musculoskeletal: Normal range of motion.  Lymphadenopathy:    He has no cervical adenopathy.  Neurological: He is alert and oriented to person, place, and time.  Skin: Skin is warm and dry. There is pallor.  Nursing note and vitals  reviewed.   ED Course  Procedures (including critical care time) Labs Review Labs Reviewed  CBC WITH DIFFERENTIAL/PLATELET - Abnormal; Notable for the following:    RBC 3.97 (*)    MCH 34.5 (*)    Platelets 130 (*)    All other components within normal limits  COMPREHENSIVE METABOLIC PANEL - Abnormal; Notable for the following:    Total Bilirubin 1.9 (*)    All other components within normal limits  BRAIN NATRIURETIC PEPTIDE - Abnormal; Notable for the following:    B Natriuretic Peptide 1227.4 (*)    All other components within normal limits  CULTURE, BLOOD (ROUTINE X 2)  CULTURE, BLOOD (ROUTINE X 2)  TROPONIN I    Imaging Review Dg Chest 2 View  09/07/2014   CLINICAL DATA:  Chest pain and nausea.  EXAM: CHEST  2 VIEW  COMPARISON:  07/27/2014  FINDINGS: Sternotomy wires unchanged. Prostatic heart valve unchanged. Lungs are adequately inflated without focal consolidation or effusion. Mild stable cardiomegaly. Calcified plaque over the aortic arch. Remainder of the exam is unchanged.  IMPRESSION: No active cardiopulmonary disease.   Electronically Signed   By: Marin Olp M.D.   On: 09/07/2014 21:30     EKG Interpretation   Date/Time:  Monday September 07 2014 20:27:04 EDT Ventricular Rate:  74 PR Interval:    QRS Duration: 162 QT Interval:  440 QTC Calculation: 488 R Axis:   180 Text Interpretation:  Atrial fibrillation Nonspecific intraventricular  conduction delay Premature ventricular complexes Otherwise no significant  change Confirmed by HARRISON  MD, FORREST ([id number]) on 09/07/2014 8:33:27 PM     A she was ambulated in the hall with the use of her walker.  His sats dropped slightly to 88% but he became quite to tachycardic with ambulation. Will admit patient for diaphoresis and monitor MDM   Final diagnoses:  Shortness of breath  Chronic congestive heart failure, unspecified congestive heart failure type  Essential hypertension        Junius Creamer, NP 09/08/14  0017  Pamella Pert, MD 09/08/14 [phone]

## 2014-09-07 NOTE — ED Notes (Signed)
Pt ambulates with walker at home; EMT will get walker before ambulating pt in hallway

## 2014-09-08 ENCOUNTER — Encounter (HOSPITAL_COMMUNITY): Payer: Self-pay

## 2014-09-08 DIAGNOSIS — K068 Other specified disorders of gingiva and edentulous alveolar ridge: Secondary | ICD-10-CM | POA: Diagnosis present

## 2014-09-08 DIAGNOSIS — I25118 Atherosclerotic heart disease of native coronary artery with other forms of angina pectoris: Secondary | ICD-10-CM

## 2014-09-08 DIAGNOSIS — I160 Hypertensive urgency: Secondary | ICD-10-CM | POA: Diagnosis present

## 2014-09-08 DIAGNOSIS — Z9841 Cataract extraction status, right eye: Secondary | ICD-10-CM | POA: Diagnosis not present

## 2014-09-08 DIAGNOSIS — Z951 Presence of aortocoronary bypass graft: Secondary | ICD-10-CM | POA: Diagnosis not present

## 2014-09-08 DIAGNOSIS — F17210 Nicotine dependence, cigarettes, uncomplicated: Secondary | ICD-10-CM | POA: Diagnosis present

## 2014-09-08 DIAGNOSIS — Z952 Presence of prosthetic heart valve: Secondary | ICD-10-CM | POA: Diagnosis not present

## 2014-09-08 DIAGNOSIS — I10 Essential (primary) hypertension: Secondary | ICD-10-CM | POA: Diagnosis not present

## 2014-09-08 DIAGNOSIS — I5043 Acute on chronic combined systolic (congestive) and diastolic (congestive) heart failure: Secondary | ICD-10-CM | POA: Diagnosis present

## 2014-09-08 DIAGNOSIS — M109 Gout, unspecified: Secondary | ICD-10-CM | POA: Diagnosis present

## 2014-09-08 DIAGNOSIS — I5022 Chronic systolic (congestive) heart failure: Secondary | ICD-10-CM | POA: Diagnosis present

## 2014-09-08 DIAGNOSIS — Z7982 Long term (current) use of aspirin: Secondary | ICD-10-CM | POA: Diagnosis not present

## 2014-09-08 DIAGNOSIS — I714 Abdominal aortic aneurysm, without rupture: Secondary | ICD-10-CM | POA: Diagnosis present

## 2014-09-08 DIAGNOSIS — Z7901 Long term (current) use of anticoagulants: Secondary | ICD-10-CM | POA: Diagnosis not present

## 2014-09-08 DIAGNOSIS — Z9842 Cataract extraction status, left eye: Secondary | ICD-10-CM | POA: Diagnosis not present

## 2014-09-08 DIAGNOSIS — I471 Supraventricular tachycardia: Secondary | ICD-10-CM | POA: Diagnosis present

## 2014-09-08 DIAGNOSIS — Z9049 Acquired absence of other specified parts of digestive tract: Secondary | ICD-10-CM | POA: Diagnosis present

## 2014-09-08 DIAGNOSIS — I350 Nonrheumatic aortic (valve) stenosis: Secondary | ICD-10-CM | POA: Diagnosis present

## 2014-09-08 DIAGNOSIS — I480 Paroxysmal atrial fibrillation: Secondary | ICD-10-CM | POA: Diagnosis not present

## 2014-09-08 DIAGNOSIS — M1990 Unspecified osteoarthritis, unspecified site: Secondary | ICD-10-CM | POA: Diagnosis present

## 2014-09-08 DIAGNOSIS — Z8673 Personal history of transient ischemic attack (TIA), and cerebral infarction without residual deficits: Secondary | ICD-10-CM | POA: Diagnosis not present

## 2014-09-08 DIAGNOSIS — I5023 Acute on chronic systolic (congestive) heart failure: Secondary | ICD-10-CM | POA: Diagnosis not present

## 2014-09-08 DIAGNOSIS — E785 Hyperlipidemia, unspecified: Secondary | ICD-10-CM | POA: Diagnosis present

## 2014-09-08 DIAGNOSIS — R0602 Shortness of breath: Secondary | ICD-10-CM | POA: Diagnosis present

## 2014-09-08 DIAGNOSIS — Z96651 Presence of right artificial knee joint: Secondary | ICD-10-CM | POA: Diagnosis present

## 2014-09-08 DIAGNOSIS — R579 Shock, unspecified: Secondary | ICD-10-CM | POA: Diagnosis present

## 2014-09-08 DIAGNOSIS — Z953 Presence of xenogenic heart valve: Secondary | ICD-10-CM | POA: Diagnosis not present

## 2014-09-08 DIAGNOSIS — Z98818 Other dental procedure status: Secondary | ICD-10-CM | POA: Diagnosis not present

## 2014-09-08 DIAGNOSIS — I4891 Unspecified atrial fibrillation: Secondary | ICD-10-CM | POA: Diagnosis not present

## 2014-09-08 DIAGNOSIS — I509 Heart failure, unspecified: Secondary | ICD-10-CM | POA: Diagnosis not present

## 2014-09-08 DIAGNOSIS — I2510 Atherosclerotic heart disease of native coronary artery without angina pectoris: Secondary | ICD-10-CM | POA: Diagnosis present

## 2014-09-08 LAB — HEPARIN LEVEL (UNFRACTIONATED): Heparin Unfractionated: 0.59 IU/mL (ref 0.30–0.70)

## 2014-09-08 LAB — PROTIME-INR
INR: 1.29 (ref 0.00–1.49)
Prothrombin Time: 16.2 seconds — ABNORMAL HIGH (ref 11.6–15.2)

## 2014-09-08 MED ORDER — LATANOPROST 0.005 % OP SOLN
1.0000 [drp] | Freq: Every day | OPHTHALMIC | Status: DC
  Administered 2014-09-08 – 2014-09-10 (×3): 1 [drp] via OPHTHALMIC
  Filled 2014-09-08 (×2): qty 2.5

## 2014-09-08 MED ORDER — WARFARIN SODIUM 5 MG PO TABS
5.0000 mg | ORAL_TABLET | Freq: Once | ORAL | Status: AC
  Administered 2014-09-08: 5 mg via ORAL
  Filled 2014-09-08: qty 1

## 2014-09-08 MED ORDER — SODIUM CHLORIDE 0.9 % IJ SOLN: 3.0000 mL | INTRAMUSCULAR | Status: DC | PRN

## 2014-09-08 MED ORDER — TRAMADOL HCL 50 MG PO TABS: 50.0000 mg | ORAL_TABLET | ORAL | Status: DC | PRN

## 2014-09-08 MED ORDER — CARVEDILOL 6.25 MG PO TABS
6.2500 mg | ORAL_TABLET | Freq: Two times a day (BID) | ORAL | Status: DC
  Administered 2014-09-08 – 2014-09-11 (×7): 6.25 mg via ORAL
  Filled 2014-09-08 (×7): qty 1

## 2014-09-08 MED ORDER — HEPARIN SODIUM (PORCINE) 5000 UNIT/ML IJ SOLN
5000.0000 [IU] | Freq: Three times a day (TID) | INTRAMUSCULAR | Status: DC
  Administered 2014-09-08: 5000 [IU] via SUBCUTANEOUS
  Filled 2014-09-08: qty 1

## 2014-09-08 MED ORDER — COUMADIN BOOK
Freq: Once | Status: AC
  Administered 2014-09-08: 12:00:00
  Filled 2014-09-08: qty 1

## 2014-09-08 MED ORDER — HEPARIN (PORCINE) IN NACL 100-0.45 UNIT/ML-% IJ SOLN
800.0000 [IU]/h | INTRAMUSCULAR | Status: DC
  Administered 2014-09-08: 1100 [IU]/h via INTRAVENOUS
  Administered 2014-09-09: 950 [IU]/h via INTRAVENOUS
  Filled 2014-09-08 (×2): qty 250

## 2014-09-08 MED ORDER — ONDANSETRON HCL 4 MG/2ML IJ SOLN: 4.0000 mg | Freq: Four times a day (QID) | INTRAMUSCULAR | Status: DC | PRN

## 2014-09-08 MED ORDER — RAMIPRIL 5 MG PO CAPS
5.0000 mg | ORAL_CAPSULE | Freq: Every day | ORAL | Status: DC
  Administered 2014-09-08 – 2014-09-11 (×4): 5 mg via ORAL
  Filled 2014-09-08 (×4): qty 1

## 2014-09-08 MED ORDER — SODIUM CHLORIDE 0.9 % IV SOLN: 250.0000 mL | INTRAVENOUS | Status: DC | PRN

## 2014-09-08 MED ORDER — ACETAMINOPHEN 325 MG PO TABS: 650.0000 mg | ORAL_TABLET | ORAL | Status: DC | PRN

## 2014-09-08 MED ORDER — HYDROCODONE-ACETAMINOPHEN 5-325 MG PO TABS: 1.0000 | ORAL_TABLET | Freq: Four times a day (QID) | ORAL | Status: DC | PRN

## 2014-09-08 MED ORDER — LABETALOL HCL 5 MG/ML IV SOLN
5.0000 mg | INTRAVENOUS | Status: DC | PRN
  Administered 2014-09-08: 5 mg via INTRAVENOUS
  Filled 2014-09-08: qty 4

## 2014-09-08 MED ORDER — SODIUM CHLORIDE 0.9 % IJ SOLN
3.0000 mL | Freq: Two times a day (BID) | INTRAMUSCULAR | Status: DC
  Administered 2014-09-08 – 2014-09-11 (×4): 3 mL via INTRAVENOUS

## 2014-09-08 MED ORDER — LEVOTHYROXINE SODIUM 25 MCG PO TABS
25.0000 ug | ORAL_TABLET | Freq: Every day | ORAL | Status: DC
  Administered 2014-09-08 – 2014-09-11 (×4): 25 ug via ORAL
  Filled 2014-09-08 (×4): qty 1

## 2014-09-08 MED ORDER — WARFARIN - PHARMACIST DOSING INPATIENT: Freq: Every day | Status: DC

## 2014-09-08 MED ORDER — FUROSEMIDE 10 MG/ML IJ SOLN
40.0000 mg | Freq: Two times a day (BID) | INTRAMUSCULAR | Status: DC
  Administered 2014-09-08 – 2014-09-10 (×6): 40 mg via INTRAVENOUS
  Filled 2014-09-08 (×6): qty 4

## 2014-09-08 MED ORDER — HEPARIN BOLUS VIA INFUSION
4000.0000 [IU] | Freq: Once | INTRAVENOUS | Status: DC
  Filled 2014-09-08: qty 4000

## 2014-09-08 MED ORDER — ASPIRIN EC 81 MG PO TBEC
81.0000 mg | DELAYED_RELEASE_TABLET | Freq: Every day | ORAL | Status: DC
  Administered 2014-09-08 – 2014-09-11 (×4): 81 mg via ORAL
  Filled 2014-09-08 (×4): qty 1

## 2014-09-08 MED ORDER — WARFARIN VIDEO
Freq: Once | Status: AC
  Administered 2014-09-08: 12:00:00

## 2014-09-08 MED ORDER — DORZOLAMIDE HCL-TIMOLOL MAL 2-0.5 % OP SOLN
1.0000 [drp] | Freq: Two times a day (BID) | OPHTHALMIC | Status: DC
  Administered 2014-09-08 – 2014-09-11 (×7): 1 [drp] via OPHTHALMIC
  Filled 2014-09-08 (×2): qty 10

## 2014-09-08 NOTE — Progress Notes (Addendum)
ANTICOAGULATION CONSULT NOTE - Initial Consult  Pharmacy Consult for heparin/warfarin Indication: atrial fibrillation  No Known Allergies  Patient Measurements: Height: 5\' 11"  (180.3 cm) Weight: 174 lb 14.4 oz (79.334 kg) IBW/kg (Calculated) : 75.3 Heparin Dosing Weight: 79 kg  Vital Signs: Temp: 98.1 F (36.7 C) (07/26 0451) Temp Source: Oral (07/26 0451) BP: 166/92 mmHg (07/26 0451) Pulse Rate: 75 (07/26 0451)  Labs:  Recent Labs  09/07/14 1643 09/07/14 2045  HGB 13.7  --   HCT 39.3  --   PLT 130*  --   CREATININE 1.01  --   TROPONINI  --  <0.03    Estimated Creatinine Clearance: 55.9 mL/min (by C-G formula based on Cr of 1.01).   Medical History: Past Medical History  Diagnosis Date  . Atrial fibrillation     s/p cardioversion October 2012  . Hypertension   . AAA (abdominal aortic aneurysm)     s/p stent graft in September 2009 & with attempted embolization/occlusion of vessels for a type 2 leak around the stent graft; Managed by Dr. Oneida Alar; last CT in 2012 showing the leak had closed.   [name] History of carotid stenosis     s/p L CEA in September 2006  . Gout   . Arthritis   . Hyperlipidemia   . Wears glasses   . Coronary artery disease     Remote CABG x 5 in 2000  . Aortic stenosis     s/p AVR in September 2012 per Dr. Cyndia Bent  . PVC's (premature ventricular contractions)   . PAT (paroxysmal atrial tachycardia)   . Inguinal hernia     RIH  . Emphysema of lung   . High risk medication use     on amiodarone  . Chronic anticoagulation   . Gallstones     s/p cholecystectomy in Dec 2012  . Stroke 2006    three strokes -no residuals  . Urinary frequency     at night  . Anemia     on iron pills  . CHF (congestive heart failure)     SEPT AND OCT 2012; follow up echo in October shows EF of 50%.      Assessment: 88yoM admitted 09/07/2014 with increased SOB, increased ankle edema, and chest pressure s/p dental sx last week for removal of multiple teeth.  PMH significant for A.fib (no anticoagulants PTA), pharmacy to start heparin/warfarin. Plan is to continue warfarin for 4 weeks for elective cardioversion. Pt has already received SQ heparin dose this AM, therefore will not bolus. H/H 13.7/39.3, Plt 130, No bleeding reported. INR 1.29.  Goal of Therapy:  INR 2-3 Heparin level 0.3-0.7 units/ml Monitor platelets by anticoagulation protocol: Yes   Plan:  -Start heparin 1100 units/hr -Start warfarin 5mg  x1 -F/u 1800 HL -Daily HL/INR/CBC -D/c heparin when INR therapeutic  Dimitri Ped, PharmD. Clinical Pharmacist Resident Pager: [phone]

## 2014-09-08 NOTE — Progress Notes (Signed)
ANTICOAGULATION CONSULT NOTE - Initial Consult  Pharmacy Consult for heparin/warfarin Indication: atrial fibrillation   Labs:  Recent Labs  09/07/14 1643 09/07/14 2045 09/08/14 0950 09/08/14 1913  HGB 13.7  --   --   --   HCT 39.3  --   --   --   PLT 130*  --   --   --   LABPROT  --   --  16.2*  --   INR  --   --  1.29  --   HEPARINUNFRC  --   --   --  0.59  CREATININE 1.01  --   --   --   TROPONINI  --  <0.03  --   --     Estimated Creatinine Clearance: 55.9 mL/min (by C-G formula based on Cr of 1.01).   Assessment: 88yoM admitted 09/07/2014 with increased SOB, increased ankle edema, and chest pressure s/p dental sx last week for removal of multiple teeth. PMH significant for A.fib (no anticoagulants PTA), pharmacy to start heparin/warfarin. Plan is to continue warfarin for 4 weeks for elective cardioversion. Pt has already received SQ heparin dose this AM, therefore will not bolus. H/H 13.7/39.3, Plt 130, No bleeding reported. INR 1.29.  First heparin level is therapeutic at 0.59  Goal of Therapy:  INR 2-3 Heparin level 0.3-0.7 units/ml Monitor platelets by anticoagulation protocol: Yes   Plan:  Continue heparin at 1100 units/hr and check AM labs.  Heide Guile, PharmD, BCPS-AQ ID Clinical Pharmacist Pager [phone]

## 2014-09-08 NOTE — Care Management Note (Addendum)
Case Management Note  Patient Details  Name: MATIN MATTIOLI MRN: [id number] Date of Birth: 02-06-1927  Subjective/Objective:  Pt admitted for HTN urgency. Pt is from home with wife.                   Action/Plan: Wife had questions in regards to [facility] RN services for administration of Vitamin B 12 injections. CM did call [facility] to see if they can provide that service. Wife would like for [facility] to visit for medication/ disease management. Referral was made with [facility] for [facility]. Pt will need order for [facility] services and [id number] once medically stable for d/c. CM did contact the HF Navigator as well. Will continue to monitor.    Expected Discharge Date:                  Expected Discharge Plan:  [place]  In-House Referral:  NA  Discharge planning Services  CM Consult  Post Acute Care Choice:  NA Choice offered to:  Patient, Spouse  DME Arranged:  N/A DME Agency:  NA  HH Arranged:  RN [place] Agency:  [place]  Status of Service:  Completed, signed off  Medicare Important Message Given:    Date Medicare IM Given:    Medicare IM give by:    Date Additional Medicare IM Given:    Additional Medicare Important Message give by:     If discussed at [place] of Stay Meetings, dates discussed:    Additional Comments:  1113 09-09-14 Jacqlyn Krauss, RN,BSN CM discussed with pt and wife regards to vit B injection. Pharmacy to see when last dose was provided to pt and per MD should be able to administer here. Pt and wife agreeable to [facility]. SOC to begin within 24-48 hrs post d/c. No further needs from CM at this time.   Bethena Roys, RN 09/08/2014, 12:41 PM

## 2014-09-08 NOTE — ED Notes (Signed)
Admitting MD at bedside.

## 2014-09-08 NOTE — Discharge Instructions (Addendum)
Information on my medicine - Coumadin®   (Warfarin) ° °This medication education was reviewed with me or my healthcare representative as part of my discharge preparation.   ° °Why was Coumadin prescribed for you? °Coumadin was prescribed for you because you have a blood clot or a medical condition that can cause an increased risk of forming blood clots. Blood clots can cause serious health problems by blocking the flow of blood to the heart, lung, or brain. Coumadin can prevent harmful blood clots from forming. °As a reminder your indication for Coumadin is:   Stroke Prevention Because Of Atrial Fibrillation ° °What test will check on my response to Coumadin? °While on Coumadin (warfarin) you will need to have an INR test regularly to ensure that your dose is keeping you in the desired range. The INR (international normalized ratio) number is calculated from the result of the laboratory test called prothrombin time (PT). ° °If an INR APPOINTMENT HAS NOT ALREADY BEEN MADE FOR YOU please schedule an appointment to have this lab work done by your health care provider within 7 days. °Your INR goal is usually a number between:  2 to 3 or your provider may give you a more narrow range like 2-2.5.  Ask your health care provider during an office visit what your goal INR is. ° °What  do you need to  know  About  COUMADIN? °Take Coumadin (warfarin) exactly as prescribed by your healthcare provider about the same time each day.  DO NOT stop taking without talking to the doctor who prescribed the medication.  Stopping without other blood clot prevention medication to take the place of Coumadin may increase your risk of developing a new clot or stroke.  Get refills before you run out. ° °What do you do if you miss a dose? °If you miss a dose, take it as soon as you remember on the same day then continue your regularly scheduled regimen the next day.  Do not take two doses of Coumadin at the same time. ° °Important Safety  Information °A possible side effect of Coumadin (Warfarin) is an increased risk of bleeding. You should call your healthcare provider right away if you experience any of the following: °? Bleeding from an injury or your nose that does not stop. °? Unusual colored urine (red or dark brown) or unusual colored stools (red or black). °? Unusual bruising for unknown reasons. °? A serious fall or if you hit your head (even if there is no bleeding). ° °Some foods or medicines interact with Coumadin® (warfarin) and might alter your response to warfarin. To help avoid this: °? Eat a balanced diet, maintaining a consistent amount of Vitamin K. °? Notify your provider about major diet changes you plan to make. °? Avoid alcohol or limit your intake to 1 drink for women and 2 drinks for men per day. °(1 drink is 5 oz. wine, 12 oz. beer, or 1.5 oz. liquor.) ° °Make sure that ANY health care provider who prescribes medication for you knows that you are taking Coumadin (warfarin).  Also make sure the healthcare provider who is monitoring your Coumadin knows when you have started a new medication including herbals and non-prescription products. ° °Coumadin® (Warfarin)  Major Drug Interactions  °Increased Warfarin Effect Decreased Warfarin Effect  °Alcohol (large quantities) °Antibiotics (esp. Septra/Bactrim, Flagyl, Cipro) °Amiodarone (Cordarone) °Aspirin (ASA) °Cimetidine (Tagamet) °Megestrol (Megace) °NSAIDs (ibuprofen, naproxen, etc.) °Piroxicam (Feldene) °Propafenone (Rythmol SR) °Propranolol (Inderal) °Isoniazid (INH) °Posaconazole (Noxafil) Barbiturates (Phenobarbital) °  Carbamazepine (Tegretol) Chlordiazepoxide (Librium) Cholestyramine (Questran) Griseofulvin Oral Contraceptives Rifampin Sucralfate (Carafate) Vitamin K   Coumadin (Warfarin) Major Herbal Interactions  Increased Warfarin Effect Decreased Warfarin Effect  Garlic Ginseng Ginkgo biloba Coenzyme Q10 Green tea St. Johns wort    Coumadin (Warfarin)  FOOD Interactions  Eat a consistent number of servings per week of foods HIGH in Vitamin K (1 serving =  cup)  Collards (cooked, or boiled & drained) Kale (cooked, or boiled & drained) Mustard greens (cooked, or boiled & drained) Parsley *serving size only =  cup Spinach (cooked, or boiled & drained) Swiss chard (cooked, or boiled & drained) Turnip greens (cooked, or boiled & drained)  Eat a consistent number of servings per week of foods MEDIUM-HIGH in Vitamin K (1 serving = 1 cup)  Asparagus (cooked, or boiled & drained) Broccoli (cooked, boiled & drained, or raw & chopped) Brussel sprouts (cooked, or boiled & drained) *serving size only =  cup Lettuce, raw (green leaf, endive, romaine) Spinach, raw Turnip greens, raw & chopped   These websites have more information on Coumadin (warfarin):  FailFactory.se; VeganReport.com.au;   Hypertension Hypertension is another name for high blood pressure. High blood pressure forces your heart to work harder to pump blood. A blood pressure reading has two numbers, which includes a higher number over a lower number (example: 110/72). HOME CARE   Have your blood pressure rechecked by your doctor.  Only take medicine as told by your doctor. Follow the directions carefully. The medicine does not work as well if you skip doses. Skipping doses also puts you at risk for problems.  Do not smoke.  Monitor your blood pressure at home as told by your doctor. GET HELP IF:  You think you are having a reaction to the medicine you are taking.  You have repeat headaches or feel dizzy.  You have puffiness (swelling) in your ankles.  You have trouble with your vision. GET HELP RIGHT AWAY IF:   You get a very bad headache and are confused.  You feel weak, numb, or faint.  You get chest or belly (abdominal) pain.  You throw up (vomit).  You cannot breathe very well. MAKE SURE YOU:   Understand these instructions.  Will  watch your condition.  Will get help right away if you are not doing well or get worse. Document Released: 07/19/2007 Document Revised: 02/04/2013 Document Reviewed: 11/22/2012 [facility] Patient Information 2015 [place], [place]. This information is not intended to replace advice given to you by your health care provider. Make sure you discuss any questions you have with your health care provider.  Heart Failure Heart failure means your heart has trouble pumping blood. This makes it hard for your body to work well. Heart failure is usually a long-term (chronic) condition. You must take good care of yourself and follow your doctor's treatment plan. HOME CARE  Take your heart medicine as told by your doctor.  Do not stop taking medicine unless your doctor tells you to.  Do not skip any dose of medicine.  Refill your medicines before they run out.  Take other medicines only as told by your doctor or pharmacist.  Stay active if told by your doctor. The elderly and people with severe heart failure should talk with a doctor about physical activity.  Eat heart-healthy foods. Choose foods that are without trans fat and are low in saturated fat, cholesterol, and salt (sodium). This includes fresh or frozen fruits and vegetables, fish, lean meats, fat-free or  low-fat dairy foods, whole grains, and high-fiber foods. Lentils and dried peas and beans (legumes) are also good choices.  Limit salt if told by your doctor.  Cook in a healthy way. Roast, grill, broil, bake, poach, steam, or stir-fry foods.  Limit fluids as told by your doctor.  Weigh yourself every morning. Do this after you pee (urinate) and before you eat breakfast. Write down your weight to give to your doctor.  Take your blood pressure and write it down if your doctor tells you to.  Ask your doctor how to check your pulse. Check your pulse as told.  Lose weight if told by your doctor.  Stop smoking or chewing tobacco. Do not use  gum or patches that help you quit without your doctor's approval.  Schedule and go to doctor visits as told.  Nonpregnant women should have no more than 1 drink a day. Men should have no more than 2 drinks a day. Talk to your doctor about drinking alcohol.  Stop illegal drug use.  Stay current with shots (immunizations).  Manage your health conditions as told by your doctor.  Learn to manage your stress.  Rest when you are tired.  If it is really hot outside:  Avoid intense activities.  Use air conditioning or fans, or get in a cooler place.  Avoid caffeine and alcohol.  Wear loose-fitting, lightweight, and light-colored clothing.  If it is really cold outside:  Avoid intense activities.  Layer your clothing.  Wear mittens or gloves, a hat, and a scarf when going outside.  Avoid alcohol.  Learn about heart failure and get support as needed.  Get help to maintain or improve your quality of life and your ability to care for yourself as needed. GET HELP IF:   You gain 03 lb/1.4 kg or more in 1 day or 05 lb/2.3 kg in a week.  You are more short of breath than usual.  You cannot do your normal activities.  You tire easily.  You cough more than normal, especially with activity.  You have any or more puffiness (swelling) in areas such as your hands, feet, ankles, or belly (abdomen).  You cannot sleep because it is hard to breathe.  You feel like your heart is beating fast (palpitations).  You get dizzy or light-headed when you stand up. GET HELP RIGHT AWAY IF:   You have trouble breathing.  There is a change in mental status, such as becoming less alert or not being able to focus.  You have chest pain or discomfort.  You faint. MAKE SURE YOU:   Understand these instructions.  Will watch your condition.  Will get help right away if you are not doing well or get worse. Document Released: 11/09/2007 Document Revised: 06/16/2013 Document Reviewed:  03/18/2012 [facility] Patient Information 2015 [place], [place]. This information is not intended to replace advice given to you by your health care provider. Make sure you discuss any questions you have with your health care provider.

## 2014-09-08 NOTE — Progress Notes (Signed)
Patient admitted after midnight- await echo, added lasix BID.  Appreciate cards- heparin gtt -in a [place] DO

## 2014-09-08 NOTE — H&P (Addendum)
[facility] History and Physical  [name] CVE:[id number] DOB: [birth year] DOA: 09/07/2014  Referring physician: EDP PCP: Tamsen Roers, MD   Chief Complaint: SOB   HPI: [name] is a 81 y.o. male h/o CABG, AVR, EF 45% post op, who presents to the ED with increased SOB over the past couple of days.  He had a surgery last week for removal of multiple teeth.  He was put on IV ABx with surgery for endocarditis prophylaxis.  He has had decreased PO intake since the surgery, but over the past couple of days hasnt had any pain at the surgical site.  He has had gradual onset of SOB over the past couple of days, there has been some associated increased ankle edema.  There has been associated chest pressure.  Nothing makes symptoms better.  Symptoms are worse with exertion.  Review of Systems: Systems reviewed.  As above, otherwise negative  Past Medical History  Diagnosis Date  . Atrial fibrillation     s/p cardioversion October 2012  . Hypertension   . AAA (abdominal aortic aneurysm)     s/p stent graft in September 2009 & with attempted embolization/occlusion of vessels for a type 2 leak around the stent graft; Managed by Dr. Oneida Alar; last CT in 2012 showing the leak had closed.   [name] History of carotid stenosis     s/p L CEA in September 2006  . Gout   . Arthritis   . Hyperlipidemia   . Wears glasses   . Coronary artery disease     Remote CABG x 5 in 2000  . Aortic stenosis     s/p AVR in September 2012 per Dr. Cyndia Bent  . PVC's (premature ventricular contractions)   . PAT (paroxysmal atrial tachycardia)   . Inguinal hernia     RIH  . Emphysema of lung   . High risk medication use     on amiodarone  . Chronic anticoagulation   . Gallstones     s/p cholecystectomy in Dec 2012  . Stroke 2006    three strokes -no residuals  . Urinary frequency     at night  . Anemia     on iron pills  . CHF (congestive heart failure)     SEPT AND OCT 2012; follow up echo in  October shows EF of 50%.    Past Surgical History  Procedure Laterality Date  . Tonsillectomy    . Joint replacement      Right TKR  . Abdominal aortic aneurysm repair  2008    Gore Excluder Stent Graft repair  . Carotid endarterectomy  2006    Left Side  . Hemorrhoid surgery    . Replacement total knee  2008  . Abdominal aortic aneurysm repair  2010/2012  . Cardiac catheterization  04/29/1998    EF 60%  . US echocardiography  08/18/2009    EF 55-60%  . US echocardiography  04/22/2007    EF 55-60%  . Cardiovascular stress test  08/19/2009  . Aortic valve replacement  10/24/10    AVR  #25 mm Magna Ease pericardial valve  . Hernia repair  10/13/10    RIH  . Colon surgery      lap right colon  . Hemorrhoid surgery    . Coronary artery bypass graft  1970    bypass and open heart surgery--pt states this is incorrect--his only cabg was in 2000  . Coronary artery bypass graft  2000    5  vessel BY DR.BARTEL. LIMA GRAFT TO THE LAD, SEQUENTIAL SAPHENOUS VEIN GRAFT TO THE  FIRST DIAGONAL AND FIRST OBTUSE MARGINAL VESSELS, SAPHENOUS VEIN GRAFT TO THE SECOND OBTUSE MARGINAL VESSEL, AND SAPHENOUS VEIN GRAFT TO THE PDA  . Eye surgery      bilateral cataract extractions  . Cholecystectomy  01/23/2011    Procedure: LAPAROSCOPIC CHOLECYSTECTOMY WITH INTRAOPERATIVE CHOLANGIOGRAM;  Surgeon: Rolm Bookbinder, MD;  Location: [facility] ORS;  Service: General;  Laterality: N/A;  . Abdominal aortagram N/A 12/25/2011    Procedure: ABDOMINAL Maxcine Ham;  Surgeon: Elam Dutch, MD;  Location: [facility] CATH LAB;  Service: Cardiovascular;  Laterality: N/A;   Social History:  reports that he has been smoking Cigars.  He has never used smokeless tobacco. He reports that he drinks about 12.6 oz of alcohol per week. He reports that he does not use illicit drugs.  No Known Allergies  Family History  Problem Relation Age of Onset  . Other Mother     falopian tube during pregnancy   . Heart disease Father   . Cancer  Brother     liver      Prior to Admission medications   Medication Sig Start Date End Date Taking? Authorizing Provider  aspirin EC 81 MG tablet Take 81 mg by mouth daily.   Yes Historical Provider, MD  carvedilol (COREG) 6.25 MG tablet Take 1 tablet (6.25 mg total) by mouth 2 (two) times daily with a meal. 07/14/14  Yes Peter M Martinique, MD  dorzolamide-timolol (COSOPT) 22.3-6.8 MG/ML ophthalmic solution 1 drop 2 (two) times daily.   Yes Historical Provider, MD  latanoprost (XALATAN) 0.005 % ophthalmic solution Place 1 drop into both eyes at bedtime.   Yes Historical Provider, MD  levothyroxine (SYNTHROID, LEVOTHROID) 25 MCG tablet Take 1 tablet (25 mcg total) by mouth daily before breakfast. 06/19/14  Yes Peter M Martinique, MD  Multiple Vitamin (MULTIVITAMIN) tablet Take 1 tablet by mouth 2 (two) times daily.    Yes Historical Provider, MD  ramipril (ALTACE) 5 MG capsule Take 1 capsule (5 mg total) by mouth daily. 06/23/14  Yes Peter M Martinique, MD  traMADol (ULTRAM) 50 MG tablet Take 50 mg by mouth as needed. FOR PAIN 08/31/14  Yes Historical Provider, MD   Physical Exam: Filed Vitals:   09/08/14 0000  BP: 199/96  Pulse: 75  Temp:   Resp: 25    BP 199/96 mmHg  Pulse 75  Temp(Src) 97.6 F (36.4 C) (Axillary)  Resp 25  Ht 5\' 11"  (1.803 m)  Wt 79.379 kg (175 lb)  BMI 24.42 kg/m2  SpO2 97%  General Appearance:    Alert, oriented, no distress, appears stated age  Head:    Normocephalic, atraumatic  Eyes:    PERRL, EOMI, sclera non-icteric        Nose:   Nares without drainage or epistaxis. Mucosa, turbinates normal  Throat:   Moist mucous membranes. Oropharynx without erythema or exudate.  Neck:   Supple. No carotid bruits.  No thyromegaly.  No lymphadenopathy.   Back:     No CVA tenderness, no spinal tenderness  Lungs:     Clear to auscultation bilaterally, without wheezes, rhonchi or rales  Chest wall:    No tenderness to palpitation  Heart:    Regular rate and rhythm without murmurs,  gallops, rubs  Abdomen:     Soft, non-tender, nondistended, normal bowel sounds, no organomegaly  Genitalia:    deferred  Rectal:    deferred  Extremities:  Trace ankle edema  Pulses:   2+ and symmetric all extremities  Skin:   Skin lesion on forehead is suspicious.  Family already knows about this and he is supposed to be following up with dermatology for removal but hasnt been.  Lymph nodes:   Cervical, supraclavicular, and axillary nodes normal  Neurologic:   CNII-XII intact. Normal strength, sensation and reflexes      throughout    Labs on Admission:  Basic Metabolic Panel:  Recent Labs Lab 09/07/14 1643  NA 137  K 4.2  CL 104  CO2 25  GLUCOSE 93  BUN 11  CREATININE 1.01  CALCIUM 10.2   Liver Function Tests:  Recent Labs Lab 09/07/14 1643  AST 26  ALT 18  ALKPHOS 63  BILITOT 1.9*  PROT 6.8  ALBUMIN 4.4   No results for input(s): LIPASE, AMYLASE in the last 168 hours. No results for input(s): AMMONIA in the last 168 hours. CBC:  Recent Labs Lab 09/07/14 1643  WBC 5.9  NEUTROABS 4.3  HGB 13.7  HCT 39.3  MCV 99.0  PLT 130*   Cardiac Enzymes:  Recent Labs Lab 09/07/14 2045  TROPONINI <0.03    BNP (last 3 results) No results for input(s): PROBNP in the last 8760 hours. CBG: No results for input(s): GLUCAP in the last 168 hours.  Radiological Exams on Admission: Dg Chest 2 View  09/07/2014   CLINICAL DATA:  Chest pain and nausea.  EXAM: CHEST  2 VIEW  COMPARISON:  07/27/2014  FINDINGS: Sternotomy wires unchanged. Prostatic heart valve unchanged. Lungs are adequately inflated without focal consolidation or effusion. Mild stable cardiomegaly. Calcified plaque over the aortic arch. Remainder of the exam is unchanged.  IMPRESSION: No active cardiopulmonary disease.   Electronically Signed   By: Marin Olp M.D.   On: 09/07/2014 21:30    EKG: Independently reviewed.  Assessment/Plan Principal Problem:   Hypertensive urgency Active Problems:    Coronary artery disease   Chronic systolic CHF (congestive heart failure)   1. Hypertensive urgency - feel his SOB is most likely related to his SBPs which have multiple readings over 200 in the ED today.  By contrast he was 470 systolic at his office visit with cardiology last month. 1. PRN labetalol ordered 2. Continue home HTN meds 3. HF pathway 4. 2d echo ordered as he dosent appear to have had one of these since 2006 that I can see in his chart. 5. Tele monitor 6. CXR dosent appear too fluid overloaded, will hold off on diuretics for now. 2. CAD - continue home meds 3. Skin lesion on head - really needs to have this biopsied and/or removed in the near future. 4. AAA - with type 2 endo leak, managed medically, not candidate for open repair.    Code Status: Full  Family Communication: Family at bedside Disposition Plan: Admit to obs  Time spent: 70 min  Rayford Williamsen M. [facility] Pager [phone]  If 7AM-7PM, please contact the day team taking care of the patient [email or web address] Password [facility] 09/08/2014, 12:31 AM

## 2014-09-08 NOTE — Consult Note (Signed)
CONSULT NOTE  Date: 09/08/2014               Patient Name:  [name] MRN: [id number]  DOB: 11-Dec-1926 Age / Sex: 81 y.o., male        PCP: Tamsen Roers Primary Cardiologist: [place]             Referring Physician: Princella Ion              Reason for Consult: Chest pain            History of Present Illness: Patient is a 81 y.o. male with a PMHx of coronary artery disease, coronary artery bypass grafting with aortic valve replacement, mild congestive heart failure, atrial fib,  abdominal aortic aneurysm stent graft repair, who was admitted to [facility] on 09/07/2014 for evaluation of several days of increasing shortness of breath.  He had several teeth extracted several days ago. He took anti-biotics for SBE prophylaxis.  He's noticed several days of increasing shortness of breath and increasing ankle edema. His also had some chest pressure.      Medications: Outpatient medications: Prescriptions prior to admission  Medication Sig Dispense Refill Last Dose  . aspirin EC 81 MG tablet Take 81 mg by mouth daily.   09/07/2014 at Unknown time  . carvedilol (COREG) 6.25 MG tablet Take 1 tablet (6.25 mg total) by mouth 2 (two) times daily with a meal. 180 tablet 3 09/07/2014 at Unknown time  . dorzolamide-timolol (COSOPT) 22.3-6.8 MG/ML ophthalmic solution 1 drop 2 (two) times daily.   09/07/2014 at Unknown time  . latanoprost (XALATAN) 0.005 % ophthalmic solution Place 1 drop into both eyes at bedtime.   09/06/2014 at Unknown time  . levothyroxine (SYNTHROID, LEVOTHROID) 25 MCG tablet Take 1 tablet (25 mcg total) by mouth daily before breakfast. 30 tablet 3 09/07/2014 at Unknown time  . Multiple Vitamin (MULTIVITAMIN) tablet Take 1 tablet by mouth 2 (two) times daily.    09/07/2014 at Unknown time  . ramipril (ALTACE) 5 MG capsule Take 1 capsule (5 mg total) by mouth daily. 90 capsule 1 09/07/2014 at Unknown time  . traMADol (ULTRAM) 50 MG tablet Take 50 mg by mouth as needed. FOR PAIN    09/06/2014 at Unknown time    Current medications: Current Facility-Administered Medications  Medication Dose Route Frequency Provider Last Rate Last Dose  . 0.9 %  sodium chloride infusion  250 mL Intravenous PRN Etta Quill, DO      . acetaminophen (TYLENOL) tablet 650 mg  650 mg Oral Q4H PRN Etta Quill, DO      . aspirin EC tablet 81 mg  81 mg Oral Daily Etta Quill, DO   81 mg at 09/08/14 0805  . carvedilol (COREG) tablet 6.25 mg  6.25 mg Oral BID WC Etta Quill, DO   6.25 mg at 09/08/14 0805  . dorzolamide-timolol (COSOPT) 22.3-6.8 MG/ML ophthalmic solution 1 drop  1 drop Both Eyes BID Etta Quill, DO   1 drop at 09/08/14 0805  . heparin injection 5,000 Units  5,000 Units Subcutaneous 3 times per day Etta Quill, DO   5,000 Units at 09/08/14 5809  . labetalol (NORMODYNE,TRANDATE) injection 5-10 mg  5-10 mg Intravenous Q2H PRN Etta Quill, DO   5 mg at 09/08/14 0124  . latanoprost (XALATAN) 0.005 % ophthalmic solution 1 drop  1 drop Both Eyes QHS Etta Quill, DO   1 drop at 09/08/14 0230  .  levothyroxine (SYNTHROID, LEVOTHROID) tablet 25 mcg  25 mcg Oral QAC breakfast Etta Quill, DO   25 mcg at 09/08/14 0805  . ondansetron (ZOFRAN) injection 4 mg  4 mg Intravenous Q6H PRN Etta Quill, DO      . ramipril (ALTACE) capsule 5 mg  5 mg Oral Daily Etta Quill, DO   5 mg at 09/08/14 0805  . sodium chloride 0.9 % injection 3 mL  3 mL Intravenous Q12H Etta Quill, DO   3 mL at 09/08/14 0804  . sodium chloride 0.9 % injection 3 mL  3 mL Intravenous PRN Etta Quill, DO      . traMADol Veatrice Bourbon) tablet 50 mg  50 mg Oral PRN Etta Quill, DO         No Known Allergies   Past Medical History  Diagnosis Date  . Atrial fibrillation     s/p cardioversion October 2012  . Hypertension   . AAA (abdominal aortic aneurysm)     s/p stent graft in September 2009 & with attempted embolization/occlusion of vessels for a type 2 leak around the stent  graft; Managed by Dr. Oneida Alar; last CT in 2012 showing the leak had closed.   [name] History of carotid stenosis     s/p L CEA in September 2006  . Gout   . Arthritis   . Hyperlipidemia   . Wears glasses   . Coronary artery disease     Remote CABG x 5 in 2000  . Aortic stenosis     s/p AVR in September 2012 per Dr. Cyndia Bent  . PVC's (premature ventricular contractions)   . PAT (paroxysmal atrial tachycardia)   . Inguinal hernia     RIH  . Emphysema of lung   . High risk medication use     on amiodarone  . Chronic anticoagulation   . Gallstones     s/p cholecystectomy in Dec 2012  . Stroke 2006    three strokes -no residuals  . Urinary frequency     at night  . Anemia     on iron pills  . CHF (congestive heart failure)     SEPT AND OCT 2012; follow up echo in October shows EF of 50%.     Past Surgical History  Procedure Laterality Date  . Tonsillectomy    . Joint replacement      Right TKR  . Abdominal aortic aneurysm repair  2008    Gore Excluder Stent Graft repair  . Carotid endarterectomy  2006    Left Side  . Hemorrhoid surgery    . Replacement total knee  2008  . Abdominal aortic aneurysm repair  2010/2012  . Cardiac catheterization  04/29/1998    EF 60%  . US echocardiography  08/18/2009    EF 55-60%  . US echocardiography  04/22/2007    EF 55-60%  . Cardiovascular stress test  08/19/2009  . Aortic valve replacement  10/24/10    AVR  #25 mm Magna Ease pericardial valve  . Hernia repair  10/13/10    RIH  . Colon surgery      lap right colon  . Hemorrhoid surgery    . Coronary artery bypass graft  1970    bypass and open heart surgery--pt states this is incorrect--his only cabg was in 2000  . Coronary artery bypass graft  2000    5 vessel BY DR.BARTEL. LIMA GRAFT TO THE LAD, SEQUENTIAL SAPHENOUS VEIN GRAFT TO THE  FIRST DIAGONAL AND FIRST OBTUSE MARGINAL VESSELS, SAPHENOUS VEIN GRAFT TO THE SECOND OBTUSE MARGINAL VESSEL, AND SAPHENOUS VEIN GRAFT TO THE PDA  . Eye  surgery      bilateral cataract extractions  . Cholecystectomy  01/23/2011    Procedure: LAPAROSCOPIC CHOLECYSTECTOMY WITH INTRAOPERATIVE CHOLANGIOGRAM;  Surgeon: Rolm Bookbinder, MD;  Location: [facility] ORS;  Service: General;  Laterality: N/A;  . Abdominal aortagram N/A 12/25/2011    Procedure: ABDOMINAL Maxcine Ham;  Surgeon: Elam Dutch, MD;  [facility] C[facility] LAB;  Service: Cardiovascular;  Laterality: N/A;    Family History  Problem Relation Age of Onset  . Other Mother     falopian tube during pregnancy   . Heart disease Father   . Cancer Brother     liver     Social History:  reports that he has been smoking Cigars.  He has never used smokeless tobacco. He reports that he drinks about 12.6 oz of alcohol per week. He reports that he does not use illicit drugs.   Review of Systems: Constitutional:  denies fever, chills, diaphoresis, appetite change and fatigue.  HEENT: denies photophobia, eye pain, redness, hearing loss, ear pain, congestion, sore throat, rhinorrhea, sneezing, neck pain, neck stiffness and tinnitus.  Respiratory: admits to SOB, DOE, cough, chest tightness, and wheezing.  Cardiovascular: denies chest pain, palpitations.  He has had some mild leg swelling.  Gastrointestinal: denies nausea, vomiting, abdominal pain, diarrhea, constipation, blood in stool.  Genitourinary: denies dysuria, urgency, frequency, hematuria, flank pain and difficulty urinating.  Musculoskeletal: denies  myalgias, back pain, joint swelling, arthralgias and gait problem.   Skin: denies pallor, rash and wound.  Neurological: denies dizziness, seizures, syncope, weakness, light-headedness, numbness and headaches.   Hematological: denies adenopathy, easy bruising, personal or family bleeding history.  Psychiatric/ Behavioral: denies suicidal ideation, mood changes, confusion, nervousness, sleep disturbance and agitation.    Physical Exam: BP 166/92 mmHg  Pulse 75  Temp(Src) 98.1 F (36.7  C) (Oral)  Resp 22  Ht 5\' 11"  (1.803 m)  Wt 79.334 kg (174 lb 14.4 oz)  BMI 24.40 kg/m2  SpO2 96%  Wt Readings from Last 3 Encounters:  09/08/14 79.334 kg (174 lb 14.4 oz)  07/20/14 79.334 kg (174 lb 14.4 oz)  01/15/14 80.559 kg (177 lb 9.6 oz)    General: Vital signs reviewed and noted. Well-developed, well-nourished, in no acute distress; alert,   Head: Normocephalic, atraumatic, sclera anicteric,   Multiple dental extractions   Neck: Supple. Negative for carotid bruits. No JVD   Lungs:  Clear bilaterally, no  wheezes, rales, or rhonchi. Breathing is normal   Heart: Irreg. Irreg.  2/6 systolic murmur and a 1-2 /6 diastolic murmur   Abdomen/ GI :  Soft, non-tender, non-distended with normoactive bowel sounds. No hepatomegaly. No rebound/guarding. No obvious abdominal masses   MSK: Strength and the appear normal for age.   Extremities: No clubbing or cyanosis. 1+ leg  edema.  Distal pedal pulses are 2+ and equal   Neurologic:  CN are grossly intact,  No obvious motor or sensory defect.  Alert and oriented X 3. Moves all extremities spontaneously.  Psych: Responds to questions appropriately with a normal affect.     Lab results: Basic Metabolic Panel:  Recent Labs Lab 09/07/14 1643  NA 137  K 4.2  CL 104  CO2 25  GLUCOSE 93  BUN 11  CREATININE 1.01  CALCIUM 10.2    Liver Function Tests:  Recent Labs Lab 09/07/14 1643  AST 26  ALT 18  ALKPHOS 63  BILITOT 1.9*  PROT 6.8  ALBUMIN 4.4   No results for input(s): LIPASE, AMYLASE in the last 168 hours. No results for input(s): AMMONIA in the last 168 hours.  CBC:  Recent Labs Lab 09/07/14 1643  WBC 5.9  NEUTROABS 4.3  HGB 13.7  HCT 39.3  MCV 99.0  PLT 130*    Cardiac Enzymes:  Recent Labs Lab 09/07/14 2045  TROPONINI <0.03    BNP: Invalid input(s): POCBNP  CBG: No results for input(s): GLUCAP in the last 168 hours.  Coagulation Studies: No results for input(s): LABPROT, INR in the last 72  hours.   Other results: Personal review of EKG shows :  Atrial fibrillation at a rate of 74. He has a left bundle branch block. He has PVCs.  Imaging: Dg Chest 2 View  09/07/2014   CLINICAL DATA:  Chest pain and nausea.  EXAM: CHEST  2 VIEW  COMPARISON:  07/27/2014  FINDINGS: Sternotomy wires unchanged. Prostatic heart valve unchanged. Lungs are adequately inflated without focal consolidation or effusion. Mild stable cardiomegaly. Calcified plaque over the aortic arch. Remainder of the exam is unchanged.  IMPRESSION: No active cardiopulmonary disease.   Electronically Signed   By: Marin Olp M.D.   On: 09/07/2014 21:30          Assessment & Plan:  1. Chronic systolic congestive heart failure: The patient had an echo card gram the past with an ejection fraction of 45%. He now presents with symptoms consistent with congestive heart failure. He is also back in atrial fibrillation.  Agree with Lasix twice a day for now. We'll repeat his echocardiogram. He has evidence of aortic insufficiency on exam. I'm not sure if he had that previously. He recently had multiple dental extractions but he denies any fever or other symptoms of endocarditis. He took his SPE prophylaxis prior to the extraction as instructed..  2. Status post aortic valve replacement: We'll repeat the echocardiogram. He has a soft systolic and diastolic murmur.  2. Atrial fibrillation. He's been on amiodarone in the past.  He's currently not on any anticoagulation.  We'll go ahead and start him on  Iv heparin and Coumadin.  Will DC the [facility]. I would favor 4 weeks of anticoagulation with elective cardioversion instead of an urgent TEE / cardioversion.      Thayer Headings, Brooke Bonito., MD, [facility] 09/08/2014, 8:14 AM Office - [phone] Pager [phone]

## 2014-09-09 ENCOUNTER — Ambulatory Visit (HOSPITAL_COMMUNITY): Payer: Medicare HMO

## 2014-09-09 DIAGNOSIS — I480 Paroxysmal atrial fibrillation: Secondary | ICD-10-CM

## 2014-09-09 DIAGNOSIS — I5023 Acute on chronic systolic (congestive) heart failure: Secondary | ICD-10-CM

## 2014-09-09 DIAGNOSIS — I10 Essential (primary) hypertension: Secondary | ICD-10-CM | POA: Insufficient documentation

## 2014-09-09 DIAGNOSIS — I509 Heart failure, unspecified: Secondary | ICD-10-CM

## 2014-09-09 DIAGNOSIS — R0602 Shortness of breath: Secondary | ICD-10-CM

## 2014-09-09 LAB — CBC
HCT: 35.6 % — ABNORMAL LOW (ref 39.0–52.0)
Hemoglobin: 12.6 g/dL — ABNORMAL LOW (ref 13.0–17.0)
MCH: 35.1 pg — AB (ref 26.0–34.0)
MCHC: 35.4 g/dL (ref 30.0–36.0)
MCV: 99.2 fL (ref 78.0–100.0)
PLATELETS: 106 10*3/uL — AB (ref 150–400)
RBC: 3.59 MIL/uL — AB (ref 4.22–5.81)
RDW: 13.8 % (ref 11.5–15.5)
WBC: 4 10*3/uL (ref 4.0–10.5)

## 2014-09-09 LAB — BASIC METABOLIC PANEL
ANION GAP: 9 (ref 5–15)
Anion gap: 9 (ref 5–15)
BUN: 19 mg/dL (ref 6–20)
BUN: 18 mg/dL (ref 6–20)
CO2: 25 mmol/L (ref 22–32)
CO2: 27 mmol/L (ref 22–32)
CREATININE: 1.05 mg/dL (ref 0.61–1.24)
CREATININE: 1.07 mg/dL (ref 0.61–1.24)
Calcium: 9.3 mg/dL (ref 8.9–10.3)
Calcium: 9.2 mg/dL (ref 8.9–10.3)
Chloride: 103 mmol/L (ref 101–111)
Chloride: 101 mmol/L (ref 101–111)
GFR calc Af Amer: >60 mL/min (ref >60)
GFR calc Af Amer: >60 mL/min (ref >60)
GFR calc non Af Amer: >60 mL/min (ref >60)
Glucose, Bld: 97 mg/dL (ref 65–99)
Glucose, Bld: 85 mg/dL (ref 65–99)
Potassium: 3.1 mmol/L — ABNORMAL LOW (ref 3.5–5.1)
Potassium: 3.4 mmol/L — ABNORMAL LOW (ref 3.5–5.1)
SODIUM: 137 mmol/L (ref 135–145)
Sodium: 137 mmol/L (ref 135–145)

## 2014-09-09 LAB — HEPARIN LEVEL (UNFRACTIONATED)
HEPARIN UNFRACTIONATED: 0.82 [IU]/mL — AB (ref 0.30–0.70)
HEPARIN UNFRACTIONATED: 0.5 [IU]/mL (ref 0.30–0.70)
Heparin Unfractionated: 0.89 IU/mL — ABNORMAL HIGH (ref 0.30–0.70)

## 2014-09-09 LAB — PROTIME-INR
INR: 1.34 (ref 0.00–1.49)
PROTHROMBIN TIME: 16.7 s — AB (ref 11.6–15.2)

## 2014-09-09 LAB — MAGNESIUM: Magnesium: 1.9 mg/dL (ref 1.7–2.4)

## 2014-09-09 MED ORDER — WARFARIN SODIUM 5 MG PO TABS
5.0000 mg | ORAL_TABLET | Freq: Once | ORAL | Status: AC
  Administered 2014-09-09: 5 mg via ORAL
  Filled 2014-09-09: qty 1

## 2014-09-09 MED ORDER — POTASSIUM CHLORIDE CRYS ER 20 MEQ PO TBCR
20.0000 meq | EXTENDED_RELEASE_TABLET | Freq: Every day | ORAL | Status: DC
  Administered 2014-09-10: 20 meq via ORAL
  Filled 2014-09-09: qty 1

## 2014-09-09 MED ORDER — CYANOCOBALAMIN 1000 MCG/ML IJ SOLN
1000.0000 ug | Freq: Once | INTRAMUSCULAR | Status: AC
  Administered 2014-09-09: 1000 ug via INTRAMUSCULAR
  Filled 2014-09-09: qty 1

## 2014-09-09 MED ORDER — POTASSIUM CHLORIDE CRYS ER 20 MEQ PO TBCR
30.0000 meq | EXTENDED_RELEASE_TABLET | Freq: Once | ORAL | Status: AC
  Administered 2014-09-09: 30 meq via ORAL
  Filled 2014-09-09 (×2): qty 1

## 2014-09-09 NOTE — Progress Notes (Addendum)
ANTICOAGULATION CONSULT NOTE - Follow-Up Pharmacy Consult for heparin Indication: atrial fibrillation  No Known Allergies  Patient Measurements: Height: 5\' 11"  (180.3 cm) Weight: 174 lb 14.4 oz (79.334 kg) IBW/kg (Calculated) : 75.3 Heparin Dosing Weight: 79 kg  Vital Signs: Temp: 98.1 F (36.7 C) (07/27 0816) Temp Source: Oral (07/27 0816) BP: 164/77 mmHg (07/27 0816) Pulse Rate: 65 (07/27 0816)  Labs:  Recent Labs  09/07/14 1643 09/07/14 2045 09/08/14 0950 09/08/14 1913 09/09/14 0044 09/09/14 0442 09/09/14 1420  HGB 13.7  --   --   --   --  12.6*  --   HCT 39.3  --   --   --   --  35.6*  --   PLT 130*  --   --   --   --  106*  --   LABPROT  --   --  16.2*  --   --  16.7*  --   INR  --   --  1.29  --   --  1.34  --   HEPARINUNFRC  --   --   --  0.59  --  0.89* 0.82*  CREATININE 1.01  --   --   --  1.05 1.07  --   TROPONINI  --  <0.03  --   --   --   --   --     Estimated Creatinine Clearance: 52.8 mL/min (by C-G formula based on Cr of 1.07).  Assessment: 82 yo Male with Afib for anticoagulation.  Heparin level remains above goal despite rate reduction.  No bleeding noted.  Goal of Therapy:  INR 2-3 Heparin level 0.3-0.7 units/ml Monitor platelets by anticoagulation protocol: Yes   Plan:  Decrease Heparin 800 units/hr Check heparin level in 6 hours.  Coumadin 5 mg again today  [employer], Pharm.D., BCPS Clinical Pharmacist Pager [phone] 09/09/2014 3:44 PM   Addendum: Evening heparin level is therapeutic at 0.5. Plan:  Continue heparin at the same rate and check AM labs.  Heide Guile, PharmD, BCPS-AQ ID Clinical Pharmacist Pager [phone]

## 2014-09-09 NOTE — Progress Notes (Signed)
Client is having frequent paired PVC's and multiple 3 beat runs. Lab work was completed magnesium came back normal at 1.9 and potassium came back low at 3.1. Client has also had a 2.11s pause. NP Baltazar Najjar has been notified and potassium was ordered PO 30 mEq. I will continue to monitor the client.

## 2014-09-09 NOTE — Progress Notes (Signed)
PROGRESS NOTE  ISAAK DELMUNDO ZRA:[id number] DOB: 1927-01-30 DOA: 09/07/2014 PCP: Tamsen Roers, MD  Assessment/Plan: SOB due to prob CHF with volume overload -await echo -lasix IV -daily labs -i/o, daily weights  Atrial fib - cards consult -heparin gtt to PO coumadin with cardioversion in the future  Hypertensive urgency - -resume home meds with PRNs -better controlled with volume loss  CAD - continue home meds Skin lesion on head - really needs to have this biopsied and/or removed in the near future. AAA - with type 2 endo leak, managed medically, not candidate for open repair.  Code Status: full Family Communication: wife at bedside Disposition Plan:    Consultants:  cards  Procedures:      HPI/Subjective: Breathing better, anxious to go home  Objective: Filed Vitals:   09/09/14 0816  BP: 164/77  Pulse: 65  Temp: 98.1 F (36.7 C)  Resp: 18    Intake/Output Summary (Last 24 hours) at 09/09/14 1048 Last data filed at 09/09/14 0816  Gross per 24 hour  Intake 386.48 ml  Output   2735 ml  Net -2348.52 ml   Filed Weights   09/07/14 1630 09/08/14 0152  Weight: 79.379 kg (175 lb) 79.334 kg (174 lb 14.4 oz)    Exam:   General:  Awake, NAD- getting echo  Cardiovascular: rrr  Respiratory: clear  Abdomen: +BS, soft  Musculoskeletal: no pitting edema  Data Reviewed: Basic Metabolic Panel:  Recent Labs Lab 09/07/14 1643 09/09/14 0044 09/09/14 0442  NA 137 137 137  K 4.2 3.1* 3.4*  CL 104 103 101  CO2 25 25 27   GLUCOSE 93 97 85  BUN 11 19 18   CREATININE 1.01 1.05 1.07  CALCIUM 10.2 9.3 9.2  MG  --  1.9  --    Liver Function Tests:  Recent Labs Lab 09/07/14 1643  AST 26  ALT 18  ALKPHOS 63  BILITOT 1.9*  PROT 6.8  ALBUMIN 4.4   No results for input(s): LIPASE, AMYLASE in the last 168 hours. No results for input(s): AMMONIA in the last 168 hours. CBC:  Recent Labs Lab 09/07/14 1643 09/09/14 0442  WBC 5.9 4.0    NEUTROABS 4.3  --   HGB 13.7 12.6*  HCT 39.3 35.6*  MCV 99.0 99.2  PLT 130* 106*   Cardiac Enzymes:  Recent Labs Lab 09/07/14 2045  TROPONINI <0.03   BNP (last 3 results)  Recent Labs  09/07/14 2045  BNP 1227.4*    ProBNP (last 3 results) No results for input(s): PROBNP in the last 8760 hours.  CBG: No results for input(s): GLUCAP in the last 168 hours.  Recent Results (from the past 240 hour(s))  Culture, blood (routine x 2)     Status: None (Preliminary result)   Collection Time: 09/08/14 12:35 AM  Result Value Ref Range Status   Specimen Description BLOOD LEFT ARM  Final   Special Requests BOTTLES DRAWN AEROBIC AND ANAEROBIC 10CC EA  Final   Culture PENDING  Incomplete   Report Status PENDING  Incomplete     Studies: Dg Chest 2 View  09/07/2014   CLINICAL DATA:  Chest pain and nausea.  EXAM: CHEST  2 VIEW  COMPARISON:  07/27/2014  FINDINGS: Sternotomy wires unchanged. Prostatic heart valve unchanged. Lungs are adequately inflated without focal consolidation or effusion. Mild stable cardiomegaly. Calcified plaque over the aortic arch. Remainder of the exam is unchanged.  IMPRESSION: No active cardiopulmonary disease.   Electronically Signed   By: Marin Olp M.D.  On: 09/07/2014 21:30    Scheduled Meds: . aspirin EC  81 mg Oral Daily  . carvedilol  6.25 mg Oral BID WC  . dorzolamide-timolol  1 drop Both Eyes BID  . furosemide  40 mg Intravenous BID  . latanoprost  1 drop Both Eyes QHS  . levothyroxine  25 mcg Oral QAC breakfast  . ramipril  5 mg Oral Daily  . sodium chloride  3 mL Intravenous Q12H  . warfarin  5 mg Oral ONCE-1800  . Warfarin - Pharmacist Dosing Inpatient   Does not apply q1800   Continuous Infusions: . heparin 950 Units/hr (09/09/14 1009)   Antibiotics Given (last 72 hours)    None      Principal Problem:   Hypertensive urgency Active Problems:   Atrial fibrillation   On warfarin therapy   Coronary artery disease   S/P AVR  (aortic valve replacement)   Acute on chronic systolic CHF (congestive heart failure)    Time spent: 25  min    VANN, JESSICA  [facility] Pager [phone]. If 7PM-7AM, please contact night-coverage at [email or web address], password [facility] 09/09/2014, 10:48 AM  LOS: 1 day

## 2014-09-09 NOTE — Progress Notes (Signed)
  Echocardiogram 2D Echocardiogram has been performed.  [name] 09/09/2014, 11:20 AM

## 2014-09-09 NOTE — Progress Notes (Signed)
[place] for heparin/warfarin Indication: atrial fibrillation  No Known Allergies  Patient Measurements: Height: 5\' 11"  (180.3 cm) Weight: 174 lb 14.4 oz (79.334 kg) IBW/kg (Calculated) : 75.3 Heparin Dosing Weight: 79 kg  Vital Signs: Temp: 98.2 F (36.8 C) (07/26 2101) Temp Source: Oral (07/26 2101) BP: 157/66 mmHg (07/26 2101) Pulse Rate: 70 (07/26 2101)  Labs:  Recent Labs  09/07/14 1643 09/07/14 2045 09/08/14 0950 09/08/14 1913 09/09/14 0044 09/09/14 0442  HGB 13.7  --   --   --   --  12.6*  HCT 39.3  --   --   --   --  35.6*  PLT 130*  --   --   --   --  PENDING  LABPROT  --   --  16.2*  --   --  16.7*  INR  --   --  1.29  --   --  1.34  HEPARINUNFRC  --   --   --  0.59  --  0.89*  CREATININE 1.01  --   --   --  1.05  --   TROPONINI  --  <0.03  --   --   --   --     Estimated Creatinine Clearance: 53.8 mL/min (by C-G formula based on Cr of 1.05).  Assessment: 81 yo Male with Afib for anticoagulation  Goal of Therapy:  INR 2-3 Heparin level 0.3-0.7 units/ml Monitor platelets by anticoagulation protocol: Yes   Plan:  Decrease Heparin 950 units/hr Check heparin level in 8 hours.  Coumadin 5 mg again today  Phillis Knack, PharmD, BCPS

## 2014-09-09 NOTE — Progress Notes (Signed)
SUBJECTIVE: Overall the patient does not feel great but he says that his breathing is somewhat better than yesterday. His wife is in the room and she acknowledges this. He has had a good urine output.   Filed Vitals:   09/08/14 0451 09/08/14 1533 09/08/14 2101 09/09/14 0816  BP: 166/92 161/48 157/66 164/77  Pulse: 75 65 70 65  Temp: 98.1 F (36.7 C) 98.1 F (36.7 C) 98.2 F (36.8 C) 98.1 F (36.7 C)  TempSrc: Oral Oral Oral Oral  Resp: 22 18  18   Height:      Weight:      SpO2: 96% 97% 96% 97%     Intake/Output Summary (Last 24 hours) at 09/09/14 1020 Last data filed at 09/09/14 0816  Gross per 24 hour  Intake 386.48 ml  Output   2735 ml  Net -2348.52 ml    LABS: Basic Metabolic Panel:  Recent Labs  09/09/14 0044 09/09/14 0442  NA 137 137  K 3.1* 3.4*  CL 103 101  CO2 25 27  GLUCOSE 97 85  BUN 19 18  CREATININE 1.05 1.07  CALCIUM 9.3 9.2  MG 1.9  --    Liver Function Tests:  Recent Labs  09/07/14 1643  AST 26  ALT 18  ALKPHOS 63  BILITOT 1.9*  PROT 6.8  ALBUMIN 4.4   No results for input(s): LIPASE, AMYLASE in the last 72 hours. CBC:  Recent Labs  09/07/14 1643 09/09/14 0442  WBC 5.9 4.0  NEUTROABS 4.3  --   HGB 13.7 12.6*  HCT 39.3 35.6*  MCV 99.0 99.2  PLT 130* 106*   Cardiac Enzymes:  Recent Labs  09/07/14 2045  TROPONINI <0.03   BNP: Invalid input(s): POCBNP D-Dimer: No results for input(s): DDIMER in the last 72 hours. Hemoglobin A1C: No results for input(s): HGBA1C in the last 72 hours. Fasting Lipid Panel: No results for input(s): CHOL, HDL, LDLCALC, TRIG, CHOLHDL, LDLDIRECT in the last 72 hours. Thyroid Function Tests: No results for input(s): TSH, T4TOTAL, T3FREE, THYROIDAB in the last 72 hours.  Invalid input(s): FREET3  RADIOLOGY: Dg Chest 2 View  09/07/2014   CLINICAL DATA:  Chest pain and nausea.  EXAM: CHEST  2 VIEW  COMPARISON:  07/27/2014  FINDINGS: Sternotomy wires unchanged. Prostatic heart valve  unchanged. Lungs are adequately inflated without focal consolidation or effusion. Mild stable cardiomegaly. Calcified plaque over the aortic arch. Remainder of the exam is unchanged.  IMPRESSION: No active cardiopulmonary disease.   Electronically Signed   By: Marin Olp M.D.   On: 09/07/2014 21:30    PHYSICAL EXAM  patient is oriented to person time and place. Affect is normal. His right eye wanders to the right. Lungs reveal scattered rhonchi. There is no respiratory distress on oxygen. Cardiac exam reveals an S1 and S2. The abdomen is soft. There is no pitting edema.   TELEMETRY: I have reviewed telemetry today September 09, 2014. There is atrial fibrillation. The rate is controlled. There is an underlying wide QRS.   ASSESSMENT AND PLAN:    Hypertensive urgency    Blood pressure is reasonable for him today. No change in therapy.    Atrial fibrillation     His rate is controlled. Heparin and Coumadin were started yesterday. The plan will be to consider cardioversion in 4 weeks.    On warfarin therapy     Coumadin was started yesterday. He is on heparin.    Coronary artery disease     Coronary  disease is stable.     S/P AVR (aortic valve replacement)      Two-dimensional echo is to be done today.    Acute on Chronic systolic CHF (congestive heart failure)     The patient is diuresis and his shortness of breath is improving. Plan continue therapy today. Check his chemistry tomorrow.   Dola Argyle 09/09/2014 10:20 AM

## 2014-09-10 DIAGNOSIS — I4891 Unspecified atrial fibrillation: Secondary | ICD-10-CM

## 2014-09-10 LAB — BASIC METABOLIC PANEL
ANION GAP: 8 (ref 5–15)
BUN: 24 mg/dL — ABNORMAL HIGH (ref 6–20)
CALCIUM: 9.2 mg/dL (ref 8.9–10.3)
CO2: 26 mmol/L (ref 22–32)
Chloride: 103 mmol/L (ref 101–111)
Creatinine, Ser: 1.17 mg/dL (ref 0.61–1.24)
GFR, EST NON AFRICAN AMERICAN: 55 mL/min — AB (ref >60)
Glucose, Bld: 90 mg/dL (ref 65–99)
Potassium: 3.3 mmol/L — ABNORMAL LOW (ref 3.5–5.1)
SODIUM: 137 mmol/L (ref 135–145)

## 2014-09-10 LAB — PROTIME-INR
INR: 1.26 (ref 0.00–1.49)
PROTHROMBIN TIME: 15.9 s — AB (ref 11.6–15.2)

## 2014-09-10 LAB — CBC
HCT: 34.2 % — ABNORMAL LOW (ref 39.0–52.0)
Hemoglobin: 11.9 g/dL — ABNORMAL LOW (ref 13.0–17.0)
MCH: 34.7 pg — AB (ref 26.0–34.0)
MCHC: 34.8 g/dL (ref 30.0–36.0)
MCV: 99.7 fL (ref 78.0–100.0)
Platelets: 111 10*3/uL — ABNORMAL LOW (ref 150–400)
RBC: 3.43 MIL/uL — AB (ref 4.22–5.81)
RDW: 14 % (ref 11.5–15.5)
WBC: 4.6 10*3/uL (ref 4.0–10.5)

## 2014-09-10 LAB — HEPARIN LEVEL (UNFRACTIONATED): Heparin Unfractionated: 0.46 IU/mL (ref 0.30–0.70)

## 2014-09-10 MED ORDER — LIDOCAINE-EPINEPHRINE 1 %-1:100000 IJ SOLN
10.0000 mL | Freq: Once | INTRAMUSCULAR | Status: DC
  Filled 2014-09-10: qty 10

## 2014-09-10 MED ORDER — POTASSIUM CHLORIDE CRYS ER 20 MEQ PO TBCR
20.0000 meq | EXTENDED_RELEASE_TABLET | Freq: Two times a day (BID) | ORAL | Status: DC
  Administered 2014-09-10 – 2014-09-11 (×2): 20 meq via ORAL
  Filled 2014-09-10 (×2): qty 1

## 2014-09-10 MED ORDER — WARFARIN SODIUM 5 MG PO TABS
5.0000 mg | ORAL_TABLET | Freq: Once | ORAL | Status: AC
  Administered 2014-09-10: 5 mg via ORAL
  Filled 2014-09-10: qty 1

## 2014-09-10 NOTE — Progress Notes (Signed)
Echo was done yesterday. EF has improved to 55%. The prosthetic aortic valve is working well. Plan to continue therapy as outlined.  Daryel November, MD

## 2014-09-10 NOTE — Care Management Important Message (Signed)
Important Message  Patient Details  Name: [name] MRN: [id number] Date of Birth: June 06, 1926   Medicare Important Message Given:  Yes-second notification given    Pricilla Handler 09/10/2014, 12:31 PM

## 2014-09-10 NOTE — Progress Notes (Signed)
ANTICOAGULATION CONSULT NOTE - Initial Consult  Pharmacy Consult for heparin/warfarin Indication: atrial fibrillation  No Known Allergies  Patient Measurements: Height: 5\' 11"  (180.3 cm) Weight: 170 lb 4.8 oz (77.248 kg) IBW/kg (Calculated) : 75.3 Heparin Dosing Weight: 79 kg  Vital Signs: Temp: 97.5 F (36.4 C) (07/28 0438) Temp Source: Oral (07/28 0438) BP: 166/60 mmHg (07/28 0438) Pulse Rate: 54 (07/28 0438)  Labs:  Recent Labs  09/07/14 1643 09/07/14 2045 09/08/14 0950  09/09/14 0044 09/09/14 0442 09/09/14 1420 09/09/14 2201 09/10/14 0448  HGB 13.7  --   --   --   --  12.6*  --   --  11.9*  HCT 39.3  --   --   --   --  35.6*  --   --  34.2*  PLT 130*  --   --   --   --  106*  --   --  111*  LABPROT  --   --  16.2*  --   --  16.7*  --   --  15.9*  INR  --   --  1.29  --   --  1.34  --   --  1.26  HEPARINUNFRC  --   --   --   < >  --  0.89* 0.82* 0.50 0.46  CREATININE 1.01  --   --   --  1.05 1.07  --   --  1.17  TROPONINI  --  <0.03  --   --   --   --   --   --   --   < > = values in this interval not displayed.  Estimated Creatinine Clearance: 48.3 mL/min (by C-G formula based on Cr of 1.17).   Medical History: Past Medical History  Diagnosis Date  . Atrial fibrillation     s/p cardioversion October 2012  . Hypertension   . AAA (abdominal aortic aneurysm)     s/p stent graft in September 2009 & with attempted embolization/occlusion of vessels for a type 2 leak around the stent graft; Managed by Dr. Oneida Alar; last CT in 2012 showing the leak had closed.   [name] History of carotid stenosis     s/p L CEA in September 2006  . Gout   . Arthritis   . Hyperlipidemia   . Wears glasses   . Coronary artery disease     Remote CABG x 5 in 2000  . Aortic stenosis     s/p AVR in September 2012 per Dr. Cyndia Bent  . PVC's (premature ventricular contractions)   . PAT (paroxysmal atrial tachycardia)   . Inguinal hernia     RIH  . Emphysema of lung   . High risk medication  use     on amiodarone  . Chronic anticoagulation   . Gallstones     s/p cholecystectomy in Dec 2012  . Stroke 2006    three strokes -no residuals  . Urinary frequency     at night  . Anemia     on iron pills  . CHF (congestive heart failure)     SEPT AND OCT 2012; follow up echo in October shows EF of 50%.      Assessment: [name] admitted 09/07/2014 with increased SOB, increased ankle edema, and chest pressure s/p dental surgery last week for removal of multiple teeth. PMH significant for A.fib (no anticoagulants PTA).  Plan is to continue warfarin for 4 weeks for elective cardioversion.  H/H 11.9/34, Plt 111. INR 1.26,  as expected after two days of warfarin. HL was therapeutic this morning.  Heparin is currently off due to bleeding from mouth, likely due to recent tooth extraction, exacerbated by heparin gtt.  Would recommend stopping heparin bridge and continuing warfarin with slow INR increase.  Goal of Therapy:  INR 2-3 Heparin level 0.3-0.7 units/ml Monitor platelets by anticoagulation protocol: Yes   Plan:  -Warfarin 5mg  x1 -Daily HL/INR/CBC -F/u plan for heparin     Hughes Better, PharmD, BCPS Clinical Pharmacist Pager: [phone] 09/10/2014 8:14 AM

## 2014-09-10 NOTE — Progress Notes (Signed)
RN paged this NP right before shift change stating pt was coughing up bright red blood with some small clots. Pt has blood in his mouth. S/p teeth extraction 9 days ago and RN unable to isolate bleeding. Pt on Heparin drip for Afib presently being bridged to Coumadin. Heparin drip turned off and NP to bedside.   S: Not SOB, no difficulty swallowing. Per wife, she brushed his teeth some time during the night. O: Elderly, chronically ill WM in no distress. Bright red blood in mouth, small amount. Able to cough up drainage. NP able to isolate bleeding to one area of gum top left. Pressure held for minutes along with rinsing of mouth with ice water. After awhile, hemostasis achieved. VSS. A/P: 1. Gum bleeding s/p teeth extraction on Heparin with Coumadin bridge. Hold heparin for now. Per MAR, can not find where the Coumadin was given yesterday. INR this am 1.26. Asked wife and pt not to brush teeth or eat breakfast until seen by attending on rounds. Verbal report given to Dr. Eliseo Squires about incident. She is aware of medicine situation.  1% Lidocaine with Epi 10cc vial was ordered from pharmacy in case was needed. Plan was to use on a sponge and hold pressure. Not needed at this point. MD aware of availability if needed.  Pt stable and not bleeding when NP left unit. Discussed plan with charge RN from nights and oncoming day shift.  Clance Boll, NP [facility]

## 2014-09-10 NOTE — Progress Notes (Addendum)
PROGRESS NOTE  [name] KPV:[id number] DOB: 1926/10/10 DOA: 09/07/2014 PCP: Tamsen Roers, MD  Assessment/Plan: SOB due to volume overload and atrial fib - echo Compared to a prior study in 2012, the LVEF is improved to 55-60%, however, there are pauses noted and the underlying rhythm is a-fib. There is no evidence for obstruction of the bioprosthetic AVR. There is severe LAE and moderate RAE. -lasix IV per cards -daily labs -i/o, daily weights  Atrial fib - cards consult -heparin gtt d/c'd as patient had significant bleeding in his gums where his teeth were pulled(allow INR to drift up slowly with PO coumadin with cardioversion in the future)  Hypertensive urgency - -resume home meds with PRNs -better controlled with volume loss  CAD - continue home meds Skin lesion on head - really needs to have this biopsied and/or removed in the near future. AAA - with type 2 endo leak, managed medically, not candidate for open repair.  Code Status: full Family Communication: wife at bedside Disposition Plan:    Consultants:  cards  Procedures:      HPI/Subjective: Had bleeding in his gums today  Objective: Filed Vitals:   09/10/14 0438  BP: 166/60  Pulse: 54  Temp: 97.5 F (36.4 C)  Resp:     Intake/Output Summary (Last 24 hours) at 09/10/14 1226 Last data filed at 09/10/14 0930  Gross per 24 hour  Intake    240 ml  Output    690 ml  Net   -450 ml   Filed Weights   09/07/14 1630 09/08/14 0152 09/10/14 0438  Weight: 79.379 kg (175 lb) 79.334 kg (174 lb 14.4 oz) 77.248 kg (170 lb 4.8 oz)    Exam:   General:  Awake, NAD  Cardiovascular: irr  Respiratory: clear  Abdomen: +BS, soft  Musculoskeletal: no pitting edema  Data Reviewed: Basic Metabolic Panel:  Recent Labs Lab 09/07/14 1643 09/09/14 0044 09/09/14 0442 09/10/14 0448  NA 137 137 137 137  K 4.2 3.1* 3.4* 3.3*  CL 104 103 101 103  CO2 25 25 27 26   GLUCOSE 93 97 85 90  BUN  11 19 18  24*  CREATININE 1.01 1.05 1.07 1.17  CALCIUM 10.2 9.3 9.2 9.2  MG  --  1.9  --   --    Liver Function Tests:  Recent Labs Lab 09/07/14 1643  AST 26  ALT 18  ALKPHOS 63  BILITOT 1.9*  PROT 6.8  ALBUMIN 4.4   No results for input(s): LIPASE, AMYLASE in the last 168 hours. No results for input(s): AMMONIA in the last 168 hours. CBC:  Recent Labs Lab 09/07/14 1643 09/09/14 0442 09/10/14 0448  WBC 5.9 4.0 4.6  NEUTROABS 4.3  --   --   HGB 13.7 12.6* 11.9*  HCT 39.3 35.6* 34.2*  MCV 99.0 99.2 99.7  PLT 130* 106* 111*   Cardiac Enzymes:  Recent Labs Lab 09/07/14 2045  TROPONINI <0.03   BNP (last 3 results)  Recent Labs  09/07/14 2045  BNP 1227.4*    ProBNP (last 3 results) No results for input(s): PROBNP in the last 8760 hours.  CBG: No results for input(s): GLUCAP in the last 168 hours.  Recent Results (from the past 240 hour(s))  Culture, blood (routine x 2)     Status: None (Preliminary result)   Collection Time: 09/08/14 12:35 AM  Result Value Ref Range Status   Specimen Description BLOOD LEFT ARM  Final   Special Requests BOTTLES DRAWN AEROBIC AND ANAEROBIC 10CC  EA  Final   Culture NO GROWTH 1 DAY  Final   Report Status PENDING  Incomplete  Culture, blood (routine x 2)     Status: None (Preliminary result)   Collection Time: 09/08/14 12:42 AM  Result Value Ref Range Status   Specimen Description BLOOD LEFT HAND  Final   Special Requests BOTTLES DRAWN AEROBIC ONLY 10CC  Final   Culture NO GROWTH 1 DAY  Final   Report Status PENDING  Incomplete     Studies: No results found.  Scheduled Meds: . aspirin EC  81 mg Oral Daily  . carvedilol  6.25 mg Oral BID WC  . dorzolamide-timolol  1 drop Both Eyes BID  . furosemide  40 mg Intravenous BID  . latanoprost  1 drop Both Eyes QHS  . levothyroxine  25 mcg Oral QAC breakfast  . lidocaine-EPINEPHrine  10 mL Other Once  . potassium chloride  20 mEq Oral BID  . ramipril  5 mg Oral Daily  .  sodium chloride  3 mL Intravenous Q12H  . warfarin  5 mg Oral ONCE-1800  . Warfarin - Pharmacist Dosing Inpatient   Does not apply q1800   Continuous Infusions:   Antibiotics Given (last 72 hours)    None      Principal Problem:   Hypertensive urgency Active Problems:   Atrial fibrillation   On warfarin therapy   Coronary artery disease   S/P AVR (aortic valve replacement)   Acute on chronic systolic CHF (congestive heart failure)   Essential hypertension   Shortness of breath    Time spent: 25  min    Darleene Cumpian  [facility] Pager [phone]. If 7PM-7AM, please contact night-coverage at [email or web address], password [facility] 09/10/2014, 12:26 PM  LOS: 2 days

## 2014-09-11 DIAGNOSIS — Z7901 Long term (current) use of anticoagulants: Secondary | ICD-10-CM

## 2014-09-11 LAB — CBC
HEMATOCRIT: 35.6 % — AB (ref 39.0–52.0)
Hemoglobin: 12.3 g/dL — ABNORMAL LOW (ref 13.0–17.0)
MCH: 34.7 pg — AB (ref 26.0–34.0)
MCHC: 34.6 g/dL (ref 30.0–36.0)
MCV: 100.6 fL — ABNORMAL HIGH (ref 78.0–100.0)
PLATELETS: 120 10*3/uL — AB (ref 150–400)
RBC: 3.54 MIL/uL — ABNORMAL LOW (ref 4.22–5.81)
RDW: 13.9 % (ref 11.5–15.5)
WBC: 4.8 10*3/uL (ref 4.0–10.5)

## 2014-09-11 LAB — BASIC METABOLIC PANEL
ANION GAP: 6 (ref 5–15)
BUN: 26 mg/dL — AB (ref 6–20)
CO2: 29 mmol/L (ref 22–32)
Calcium: 9.5 mg/dL (ref 8.9–10.3)
Chloride: 103 mmol/L (ref 101–111)
Creatinine, Ser: 1.22 mg/dL (ref 0.61–1.24)
GFR calc Af Amer: >60 mL/min (ref >60)
GFR, EST NON AFRICAN AMERICAN: 52 mL/min — AB (ref >60)
GLUCOSE: 91 mg/dL (ref 65–99)
Potassium: 3.8 mmol/L (ref 3.5–5.1)
SODIUM: 138 mmol/L (ref 135–145)

## 2014-09-11 LAB — PROTIME-INR
INR: 1.24 (ref 0.00–1.49)
Prothrombin Time: 15.8 seconds — ABNORMAL HIGH (ref 11.6–15.2)

## 2014-09-11 MED ORDER — RAMIPRIL 5 MG PO CAPS: 5.0000 mg | ORAL_CAPSULE | Freq: Two times a day (BID) | ORAL | Status: DC

## 2014-09-11 MED ORDER — FUROSEMIDE 20 MG PO TABS: 20.0000 mg | ORAL_TABLET | Freq: Every day | ORAL | Status: DC | PRN

## 2014-09-11 MED ORDER — WARFARIN SODIUM 7.5 MG PO TABS: 7.5000 mg | ORAL_TABLET | Freq: Once | ORAL | Status: DC

## 2014-09-11 MED ORDER — WARFARIN SODIUM 7.5 MG PO TABS: 7.5000 mg | ORAL_TABLET | Freq: Every day | ORAL | Status: DC

## 2014-09-11 MED ORDER — FUROSEMIDE 10 MG/ML IJ SOLN
40.0000 mg | Freq: Every day | INTRAMUSCULAR | Status: DC
  Administered 2014-09-11: 40 mg via INTRAVENOUS
  Filled 2014-09-11: qty 4

## 2014-09-11 NOTE — Progress Notes (Signed)
[place] for heparin/warfarin Indication: atrial fibrillation  No Known Allergies  Patient Measurements: Height: 5\' 11"  (180.3 cm) Weight: 170 lb 4.8 oz (77.248 kg) IBW/kg (Calculated) : 75.3 Heparin Dosing Weight: 79 kg  Vital Signs: Temp: 98 F (36.7 C) (07/29 0640) Temp Source: Axillary (07/29 0640) BP: 142/26 mmHg (07/29 0640) Pulse Rate: 67 (07/29 0640)  Labs:  Recent Labs  09/09/14 0442 09/09/14 1420 09/09/14 2201 09/10/14 0448 09/11/14 0330  HGB 12.6*  --   --  11.9* 12.3*  HCT 35.6*  --   --  34.2* 35.6*  PLT 106*  --   --  111* 120*  LABPROT 16.7*  --   --  15.9* 15.8*  INR 1.34  --   --  1.26 1.24  HEPARINUNFRC 0.89* 0.82* 0.50 0.46  --   CREATININE 1.07  --   --  1.17 1.22    Estimated Creatinine Clearance: 46.3 mL/min (by C-G formula based on Cr of 1.22).   Medical History: Past Medical History  Diagnosis Date  . Atrial fibrillation     s/p cardioversion October 2012  . Hypertension   . AAA (abdominal aortic aneurysm)     s/p stent graft in September 2009 & with attempted embolization/occlusion of vessels for a type 2 leak around the stent graft; Managed by Dr. Oneida Alar; last CT in 2012 showing the leak had closed.   [name] History of carotid stenosis     s/p L CEA in September 2006  . Gout   . Arthritis   . Hyperlipidemia   . Wears glasses   . Coronary artery disease     Remote CABG x 5 in 2000  . Aortic stenosis     s/p AVR in September 2012 per Dr. Cyndia Bent  . PVC's (premature ventricular contractions)   . PAT (paroxysmal atrial tachycardia)   . Inguinal hernia     RIH  . Emphysema of lung   . High risk medication use     on amiodarone  . Chronic anticoagulation   . Gallstones     s/p cholecystectomy in Dec 2012  . Stroke 2006    three strokes -no residuals  . Urinary frequency     at night  . Anemia     on iron pills  . CHF (congestive heart failure)     SEPT AND OCT 2012; follow up echo in  October shows EF of 50%.      Assessment: [name] admitted 09/07/2014 with increased SOB, increased ankle edema, and chest pressure s/p dental surgery last week for removal of multiple teeth. PMH significant for A.fib (no anticoagulants PTA).  Plan is to continue warfarin for 4 weeks for elective cardioversion.  H/H 11.9/34, Plt 111. INR 1.24. Heparin gtt was dc'd yesterday after pt experienced bleeding from his mouth; bleeding is now resolved.    Goal of Therapy:  INR 2-3 Heparin level 0.3-0.7 units/ml Monitor platelets by anticoagulation protocol: Yes   Plan:  -Warfarin 7.5 mg x1 -Daily INR    Hughes Better, PharmD, BCPS Clinical Pharmacist Pager: [phone] 09/11/2014 10:52 AM

## 2014-09-11 NOTE — Progress Notes (Signed)
Patient Name: [name] Date of Encounter: 09/11/2014  Primary Cardiologist: Dr. Martinique    Principal Problem:   Hypertensive urgency Active Problems:   Atrial fibrillation   On warfarin therapy   Coronary artery disease   S/P AVR (aortic valve replacement)   Acute on chronic systolic CHF (congestive heart failure)   Essential hypertension   Shortness of breath    SUBJECTIVE  Denies any CP. SOB improved since admission. No bleeding today  CURRENT MEDS . aspirin EC  81 mg Oral Daily  . carvedilol  6.25 mg Oral BID WC  . dorzolamide-timolol  1 drop Both Eyes BID  . furosemide  40 mg Intravenous Daily  . latanoprost  1 drop Both Eyes QHS  . levothyroxine  25 mcg Oral QAC breakfast  . lidocaine-EPINEPHrine  10 mL Patrick Once  . potassium chloride  20 mEq Oral BID  . ramipril  5 mg Oral Daily  . sodium chloride  3 mL Intravenous Q12H  . warfarin  7.5 mg Oral ONCE-1800  . Warfarin - Pharmacist Dosing Inpatient   Does not apply q1800    OBJECTIVE  Filed Vitals:   09/10/14 0438 09/10/14 1300 09/10/14 2035 09/11/14 0640  BP: 166/60 102/45 151/60 142/26  Pulse: 54 57 69 67  Temp: 97.5 F (36.4 C) 97.4 F (36.3 C) 97.6 F (36.4 C) 98 F (36.7 C)  TempSrc: Oral Axillary Axillary Axillary  Resp:  16 18 18   Height:      Weight: 170 lb 4.8 oz (77.248 kg)     SpO2: 92% 99% 98% 98%   No intake or output data in the 24 hours ending 09/11/14 1116 Filed Weights   09/07/14 1630 09/08/14 0152 09/10/14 0438  Weight: 175 lb (79.379 kg) 174 lb 14.4 oz (79.334 kg) 170 lb 4.8 oz (77.248 kg)    PHYSICAL EXAM  General: Pleasant, NAD. Neuro: Alert and oriented X 3. Moves all extremities spontaneously. Psych: Normal affect. HEENT:  Normal  Neck: Supple without bruits or JVD. Lungs:  Resp regular and unlabored, CTA. Heart: irregular. no s3, s4, or murmurs. Abdomen: Soft, non-tender, non-distended, BS + x 4.  Extremities: No clubbing, cyanosis or edema. DP/PT/Radials 2+  and equal bilaterally.  Accessory Clinical Findings  CBC  Recent Labs  09/10/14 0448 09/11/14 0330  WBC 4.6 4.8  HGB 11.9* 12.3*  HCT 34.2* 35.6*  MCV 99.7 100.6*  PLT 111* 774*   Basic Metabolic Panel  Recent Labs  09/09/14 0044  09/10/14 0448 09/11/14 0330  NA 137  < > 137 138  K 3.1*  < > 3.3* 3.8  CL 103  < > 103 103  CO2 25  < > 26 29  GLUCOSE 97  < > 90 91  BUN 19  < > 24* 26*  CREATININE 1.05  < > 1.17 1.22  CALCIUM 9.3  < > 9.2 9.5  MG 1.9  --   --   --   < > = values in this interval not displayed.  TELE A-fib with occasional bradycardia with HR 40s    ECG  No new EKG  Echocardiogram 09/09/2014  LV EF: 55% -  60%  ------------------------------------------------------------------- Indications:   CHF - 428.0. Afib with pauses noted during the study.  ------------------------------------------------------------------- History:  PMH: On Warfarin therapy. S/P AVR. Atrial fibrillation. Coronary artery disease. Risk factors: Hypertension. Dyslipidemia.  ------------------------------------------------------------------- Study Conclusions  - HPI and indications: Afib with pauses noted during the study. - Left ventricle: The cavity  size was normal. Wall thickness was increased in a pattern of mild LVH. Systolic function was normal. The estimated ejection fraction was in the range of 55% to 60%. The study is not technically sufficient to allow evaluation of LV diastolic function. - Aortic valve: Bioprosthetic AVR. No obstruction. Trivial AI. - Mitral valve: Calcified annulus. Mildly thickened leaflets . There was mild regurgitation. - Left atrium: Severely dilated at 62 ml/m2. - Right ventricle: The cavity size was mildly dilated. Systolic function is reduced. - Right atrium: Moderately dilated at 26 cm2. - Tricuspid valve: There was mild regurgitation. - Pulmonary arteries: PA peak pressure: 29 mm Hg (S). - Inferior vena  cava: The vessel was normal in size. The respirophasic diameter changes were in the normal range (>= 50%), consistent with normal central venous pressure.  Impressions:  - Compared to a prior study in 2012, the LVEF is improved to 55-60%, however, there are pauses noted and the underlying rhythm is a-fib. There is no evidence for obstruction of the bioprosthetic AVR. There is severe LAE and moderate RAE.     Radiology/Studies  Dg Chest 2 View  09/07/2014   CLINICAL DATA:  Chest pain and nausea.  EXAM: CHEST  2 VIEW  COMPARISON:  07/27/2014  FINDINGS: Sternotomy wires unchanged. Prostatic heart valve unchanged. Lungs are adequately inflated without focal consolidation or effusion. Mild stable cardiomegaly. Calcified plaque over the aortic arch. Remainder of the exam is unchanged.  IMPRESSION: No active cardiopulmonary disease.   Electronically Signed   By: Marin Olp M.D.   On: 09/07/2014 21:30    ASSESSMENT AND PLAN  Patient is a 81 y.o. male with a PMHx of coronary artery disease, coronary artery bypass grafting with aortic valve replacement, mild congestive heart failure, atrial fib, abdominal aortic aneurysm stent graft repair, who was admitted to [facility] 09/07/2014 for evaluation of several days of increasing shortness of breath.  1. Acute diastolic HF  - Echo 5/78/4696 55-60% on echo. AVR stable  - s/p diuresis, I/O -2.4 L, weight down from 175 lbs to 170 lbs. Euvolemic on exam.   - d/c IV lasix, can do PRN 20mg  lasix on discharge.  2. S/p AVR in 10/2010 by Dr. Cyndia Bent  3. Proxysmal A-fib: plan will be to consider cardioversion in 4 weeks  - previously on heparin with coumadin bridge. IV heparin stopped as he had gum bleeding on 7/28 (noted to be s/p multiple dental extractions recently)  - allowing INR to slowly drift up with PO coumadin. Expect discharge once INR close to therapeutic level and no further gum bleeding.   - unable to uptitrate further given  bradycardia into the 40s (he's also on timolol eye drop). Likely outpatient DCCV at some point per Dr. Acie Fredrickson, understanding the fact echo shows his LA size is severely dilated and he may not maintain NSR despite shock. May need antiarrythmic medication in the future  4. Hypertensive urgency with SBP >200 on arrival  - unclear cause, back on home med, BP stable. Change ramipril to 5mg  BID  5. Carotid artery stenosis  6. AAA - with type 2 endo leak, managed medically  Signed, Woodward Ku Pager: [phone] Patient seen and examined. I agree with the assessment and plan as detailed above. See also my additional thoughts below.   The patient is stable. His coumadinization can be completed as an outpatient. He is ready for discharge home today.  Dola Argyle, MD, [facility] 09/11/2014 12:31 PM

## 2014-09-11 NOTE — Discharge Summary (Signed)
Physician Discharge Summary  CALVERT CHARLAND QAS:[id number] DOB: [birth year] DOA: 09/07/2014  PCP: Tamsen Roers, MD  Admit date: 09/07/2014 Discharge date: 09/11/2014  Time spent: 35 minutes  Recommendations for Outpatient Follow-up:  1. INR Monday 2. Home health RN  Discharge Diagnoses:  Principal Problem:   Hypertensive urgency Active Problems:   Atrial fibrillation   On warfarin therapy   Coronary artery disease   S/P AVR (aortic valve replacement)   Acute on chronic systolic CHF (congestive heart failure)   Essential hypertension   Shortness of breath   Discharge Condition: improved  Diet recommendation: cardiac  Filed Weights   09/07/14 1630 09/08/14 0152 09/10/14 0438  Weight: 79.379 kg (175 lb) 79.334 kg (174 lb 14.4 oz) 77.248 kg (170 lb 4.8 oz)    History of present illness:  [name] is a 81 y.o. male h/o CABG, AVR, EF 45% post op, who presents to the ED with increased SOB over the past couple of days. He had a surgery last week for removal of multiple teeth. He was put on IV ABx with surgery for endocarditis prophylaxis.  He has had decreased PO intake since the surgery, but over the past couple of days hasnt had any pain at the surgical site.  He has had gradual onset of SOB over the past couple of days, there has been some associated increased ankle edema. There has been associated chest pressure. Nothing makes symptoms better. Symptoms are worse with exertion.  Hospital Course:  Acute diastolic HF - Echo 7/98/9211 55-60% on echo. AVR stable - s/p diuresis, I/O -2.4 L, weight down from 175 lbs to 170 lbs. Euvolemic on exam- breathing improved - PRN 20mg  lasix on discharge.  S/p AVR in 10/2010 by Dr. Freda Munro A-fib: plan will be to consider cardioversion in 4 weeks - previously on heparin with coumadin bridge. IV heparin stopped as he had gum bleeding on 7/28 (noted to be s/p multiple  dental extractions recently) - allowing INR to slowly drift up with PO coumadin.  - unable to uptitrate further given bradycardia into the 40s (he's also on timolol eye drop). Likely outpatient DCCV at some point per Dr. Acie Fredrickson, understanding the fact echo shows his LA size is severely dilated and he may not maintain NSR despite shock. May need antiarrythmic medication in the future  Hypertensive urgency with SBP >200 on arrival - unclear cause, back on home med, BP stable. Change ramipril to 5mg  BID   Carotid artery stenosis  AAA - with type 2 endo leak, managed medically   Procedures:    Consultations:  cardiology  Discharge Exam: Filed Vitals:   09/11/14 0640  BP: 142/26  Pulse: 67  Temp: 98 F (36.7 C)  Resp: 18    General: awake, NAD   Discharge Instructions   Discharge Instructions    Diet - low sodium heart healthy    Complete by:  As directed      Discharge instructions    Complete by:  As directed   INR check Monday Home health RN     Increase activity slowly    Complete by:  As directed           Current Discharge Medication List    START taking these medications   Details  furosemide (LASIX) 20 MG tablet Take 1 tablet (20 mg total) by mouth daily as needed for edema (for edema and shortness of breath). Qty: 30 tablet, Refills: 0    warfarin (COUMADIN) 7.5 MG  tablet Take 1 tablet (7.5 mg total) by mouth daily. Qty: 15 tablet, Refills: 0      CONTINUE these medications which have NOT CHANGED   Details  aspirin EC 81 MG tablet Take 81 mg by mouth daily.    carvedilol (COREG) 6.25 MG tablet Take 1 tablet (6.25 mg total) by mouth 2 (two) times daily with a meal. Qty: 180 tablet, Refills: 3    cyanocobalamin (,VITAMIN B-12,) 1000 MCG/ML injection Inject 1,000 mcg into the muscle every 30 (thirty) days.    dorzolamide-timolol (COSOPT) 22.3-6.8 MG/ML ophthalmic solution 1 drop 2 (two) times daily.     latanoprost (XALATAN) 0.005 % ophthalmic solution Place 1 drop into both eyes at bedtime.    levothyroxine (SYNTHROID, LEVOTHROID) 25 MCG tablet Take 1 tablet (25 mcg total) by mouth daily before breakfast. Qty: 30 tablet, Refills: 3    Multiple Vitamin (MULTIVITAMIN) tablet Take 1 tablet by mouth 2 (two) times daily.     ramipril (ALTACE) 5 MG capsule Take 1 capsule (5 mg total) by mouth daily. Qty: 90 capsule, Refills: 1    traMADol (ULTRAM) 50 MG tablet Take 50 mg by mouth as needed. FOR PAIN       No Known Allergies Follow-up Information    Follow up with [facility].   Why:  Registered Nurse   Contact information:   [address] [place] [address] [phone]       Follow up with [facility] On 09/17/2014.   Specialty:  Cardiology   Why:  10:35am   Contact information:   [address] [place] [phone]      Follow up with Lyda Jester, PA-C On 10/12/2014.   Specialties:  Cardiology, Radiology   Why:  2:30pm   Contact information:   [address] [place] [address] [phone]       Follow up with Tamsen Roers, MD In 1 week.   Specialty:  Family Medicine   Contact information:   [address] [place] [address] [phone]        The results of significant diagnostics from this hospitalization (including imaging, microbiology, ancillary and laboratory) are listed below for reference.    Significant Diagnostic Studies: Dg Chest 2 View  09/07/2014   CLINICAL DATA:  Chest pain and nausea.  EXAM: CHEST  2 VIEW  COMPARISON:  07/27/2014  FINDINGS: Sternotomy wires unchanged. Prostatic heart valve unchanged. Lungs are adequately inflated without focal consolidation or effusion. Mild stable cardiomegaly. Calcified plaque over the aortic arch. Remainder of the exam is unchanged.  IMPRESSION: No active cardiopulmonary disease.   Electronically Signed    By: Marin Olp M.D.   On: 09/07/2014 21:30    Microbiology: Recent Results (from the past 240 hour(s))  Culture, blood (routine x 2)     Status: None (Preliminary result)   Collection Time: 09/08/14 12:35 AM  Result Value Ref Range Status   Specimen Description BLOOD LEFT ARM  Final   Special Requests BOTTLES DRAWN AEROBIC AND ANAEROBIC 10CC EA  Final   Culture NO GROWTH 2 DAYS  Final   Report Status PENDING  Incomplete  Culture, blood (routine x 2)     Status: None (Preliminary result)   Collection Time: 09/08/14 12:42 AM  Result Value Ref Range Status   Specimen Description BLOOD LEFT HAND  Final   Special Requests BOTTLES DRAWN AEROBIC ONLY 10CC  Final   Culture  NO GROWTH 2 DAYS  Final   Report Status PENDING  Incomplete     Labs: Basic Metabolic Panel:  Recent Labs Lab 09/07/14 1643 09/09/14 0044 09/09/14 0442 09/10/14 0448 09/11/14 0330  NA 137 137 137 137 138  K 4.2 3.1* 3.4* 3.3* 3.8  CL 104 103 101 103 103  CO2 25 25 27 26 29   GLUCOSE 93 97 85 90 91  BUN 11 19 18  24* 26*  CREATININE 1.01 1.05 1.07 1.17 1.22  CALCIUM 10.2 9.3 9.2 9.2 9.5  MG  --  1.9  --   --   --    Liver Function Tests:  Recent Labs Lab 09/07/14 1643  AST 26  ALT 18  ALKPHOS 63  BILITOT 1.9*  PROT 6.8  ALBUMIN 4.4   No results for input(s): LIPASE, AMYLASE in the last 168 hours. No results for input(s): AMMONIA in the last 168 hours. CBC:  Recent Labs Lab 09/07/14 1643 09/09/14 0442 09/10/14 0448 09/11/14 0330  WBC 5.9 4.0 4.6 4.8  NEUTROABS 4.3  --   --   --   HGB 13.7 12.6* 11.9* 12.3*  HCT 39.3 35.6* 34.2* 35.6*  MCV 99.0 99.2 99.7 100.6*  PLT 130* 106* 111* 120*   Cardiac Enzymes:  Recent Labs Lab 09/07/14 2045  TROPONINI <0.03   BNP: BNP (last 3 results)  Recent Labs  09/07/14 2045  BNP 1227.4*    ProBNP (last 3 results) No results for input(s): PROBNP in the last 8760 hours.  CBG: No results for input(s): GLUCAP in the last 168  hours.     SignedEulogio Bear  [facility] 09/11/2014, 12:42 PM

## 2014-09-13 LAB — CULTURE, BLOOD (ROUTINE X 2)
CULTURE: NO GROWTH
Culture: NO GROWTH

## 2014-09-14 ENCOUNTER — Ambulatory Visit (INDEPENDENT_AMBULATORY_CARE_PROVIDER_SITE_OTHER): Payer: Medicare HMO | Admitting: Pharmacist Clinician (PhC)/ Clinical Pharmacy Specialist

## 2014-09-14 DIAGNOSIS — I4891 Unspecified atrial fibrillation: Secondary | ICD-10-CM

## 2014-09-14 DIAGNOSIS — Z7901 Long term (current) use of anticoagulants: Secondary | ICD-10-CM

## 2014-09-14 LAB — POCT INR: INR: 1.6

## 2014-09-18 ENCOUNTER — Ambulatory Visit (INDEPENDENT_AMBULATORY_CARE_PROVIDER_SITE_OTHER): Payer: Medicare HMO | Admitting: Pharmacist Clinician (PhC)/ Clinical Pharmacy Specialist

## 2014-09-18 DIAGNOSIS — I4891 Unspecified atrial fibrillation: Secondary | ICD-10-CM

## 2014-09-18 DIAGNOSIS — Z7901 Long term (current) use of anticoagulants: Secondary | ICD-10-CM

## 2014-09-18 LAB — POCT INR: INR: 2.9

## 2014-09-22 ENCOUNTER — Other Ambulatory Visit: Payer: Self-pay | Admitting: Pharmacist Clinician (PhC)/ Clinical Pharmacy Specialist

## 2014-09-22 MED ORDER — WARFARIN SODIUM 7.5 MG PO TABS: ORAL_TABLET | ORAL | Status: DC

## 2014-09-23 ENCOUNTER — Other Ambulatory Visit: Payer: Self-pay | Admitting: Pharmacist Clinician (PhC)/ Clinical Pharmacy Specialist

## 2014-09-23 MED ORDER — WARFARIN SODIUM 7.5 MG PO TABS: ORAL_TABLET | ORAL | Status: DC

## 2014-09-24 ENCOUNTER — Ambulatory Visit (INDEPENDENT_AMBULATORY_CARE_PROVIDER_SITE_OTHER): Payer: Medicare HMO | Admitting: Pharmacist Clinician (PhC)/ Clinical Pharmacy Specialist

## 2014-09-24 ENCOUNTER — Telehealth: Payer: Self-pay | Admitting: Cardiology

## 2014-09-24 DIAGNOSIS — I4891 Unspecified atrial fibrillation: Secondary | ICD-10-CM

## 2014-09-24 DIAGNOSIS — Z7901 Long term (current) use of anticoagulants: Secondary | ICD-10-CM

## 2014-09-24 LAB — POCT INR: INR: 3

## 2014-09-24 NOTE — Telephone Encounter (Signed)
Tressia Miners [phone]) from [facility] called with patients PT/INR results  PT/INR = 36.0/3.0  Routed to Tommy Medal, pharmacist

## 2014-09-24 NOTE — Telephone Encounter (Signed)
See anticoag note

## 2014-10-01 ENCOUNTER — Ambulatory Visit (INDEPENDENT_AMBULATORY_CARE_PROVIDER_SITE_OTHER): Payer: Medicare HMO | Admitting: Pharmacist Clinician (PhC)/ Clinical Pharmacy Specialist

## 2014-10-01 DIAGNOSIS — Z7901 Long term (current) use of anticoagulants: Secondary | ICD-10-CM

## 2014-10-01 DIAGNOSIS — I4891 Unspecified atrial fibrillation: Secondary | ICD-10-CM

## 2014-10-01 LAB — POCT INR: INR: 2.2

## 2014-10-06 ENCOUNTER — Telehealth: Payer: Self-pay | Admitting: Cardiology

## 2014-10-06 NOTE — Telephone Encounter (Signed)
Received records from [facility] for appointment with Ellen Henri, [place] on 10/12/14.  Records given to [employer] (medical records) for Brittany's schedule on 10/12/14. lp

## 2014-10-07 ENCOUNTER — Ambulatory Visit (INDEPENDENT_AMBULATORY_CARE_PROVIDER_SITE_OTHER): Payer: Medicare HMO | Admitting: Pharmacist Clinician (PhC)/ Clinical Pharmacy Specialist

## 2014-10-07 DIAGNOSIS — Z7901 Long term (current) use of anticoagulants: Secondary | ICD-10-CM

## 2014-10-07 DIAGNOSIS — I4891 Unspecified atrial fibrillation: Secondary | ICD-10-CM | POA: Diagnosis not present

## 2014-10-07 LAB — POCT INR: INR: 1.8

## 2014-10-12 ENCOUNTER — Encounter: Payer: Self-pay | Admitting: Cardiology

## 2014-10-12 ENCOUNTER — Ambulatory Visit (INDEPENDENT_AMBULATORY_CARE_PROVIDER_SITE_OTHER): Payer: Medicare HMO | Admitting: Cardiology

## 2014-10-12 ENCOUNTER — Ambulatory Visit (INDEPENDENT_AMBULATORY_CARE_PROVIDER_SITE_OTHER): Payer: Medicare HMO | Admitting: Pharmacist Clinician (PhC)/ Clinical Pharmacy Specialist

## 2014-10-12 VITALS — BP 140/52 | HR 53 | Ht 71.0 in | Wt 174.0 lb

## 2014-10-12 DIAGNOSIS — Z7901 Long term (current) use of anticoagulants: Secondary | ICD-10-CM

## 2014-10-12 DIAGNOSIS — I4891 Unspecified atrial fibrillation: Secondary | ICD-10-CM

## 2014-10-12 DIAGNOSIS — I10 Essential (primary) hypertension: Secondary | ICD-10-CM

## 2014-10-12 LAB — POCT INR: INR: 1.8

## 2014-10-12 MED ORDER — CARVEDILOL 6.25 MG PO TABS: 3.1250 mg | ORAL_TABLET | Freq: Two times a day (BID) | ORAL | Status: DC

## 2014-10-12 NOTE — Patient Instructions (Addendum)
Your physician has recommended you make the following change in your medication: cut the carvedilol into half. Take 1/2 tablet twice daily. If you develop palpitations, you can take a extra 1/2 tablet.  Your physician recommends that you schedule a follow-up appointment in 1 week for  EKG with nurse at the time of your next coumadin check.

## 2014-10-12 NOTE — Progress Notes (Signed)
10/12/2014 [name]   [birth year]  [id number]  Primary Physician Tamsen Roers, MD Primary Cardiologist: Dr. Martinique   Reason for Visit/CC: [facility] F/u for acute diastolicCHF and Atrial Fibrillation  HPI:  The patient is a 81 year old male, followed by Dr. Martinique, who presents to clinic today for post hospital follow-up. He has a history of coronary disease and is status post CABG in 2000. Cardiac catheterization September 2012 showed patent grafts. He developed severe aortic stenosis and underwent aortic valve replacement with a tissue prosthesis. Prior to surgery he had congestive heart failure with ejection fraction 25%. Postoperatively his ejection fraction improved to 45%. He did have atrial fibrillation during that time and was placed on amiodarone and Coumadin. He was cardioverted in October of 2012. His amiodarone was then discontinued. He also has an abdominal aortic aneurysm and is status post stent grafting in 2009. He has a history of type II endoleak and had a coil procedure on one occasion. 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 This is being managed conservatively.   He presents to clinic today for post hospital follow-up. He is accompanied to clinic by his wife. He was admitted to [facility] September 07, 2014 for evaluation of several days of increasing shortness of breath. He was found to be in acute on chronic CHF and was also noted to be back in atrial fibrillation. He was treated with IV diuretic therapy. Repeat 2-D echo showed improvement in left ventricular systolic function with EF at 55-60%. Mild left ventricular hypertrophy was noted. Visualization of his bioprosthetic aortic valve showed his prosthesis to be functioning properly. He was noted to have mild mitral regurgitation. He responded well to IV diuretic therapy. His discharge weight was 170 pounds. Day of discharge, he continued in atrial fibrillation although his rate was  well-controlled. He was continued on carvedilol and Coumadin with plans to consider direct-current cardioversion as an outpatient if still in A. Fib.  In clinic today, he notes that he  continues to feel very tired and fatigued. He states he has no energy. He denies chest pain. Overall his breathing has improved without significant resting dyspnea but he continues to have mild dyspnea on exertion. He reports that he has been fully compliant with his Lasix along with daily weights and low-sodium diet. His weight in clinic today is currently 174 pounds. He denies any resting dyspnea currently. EKG in the office demonstrates persistent atrial fibrillation with slow ventricular response. His heart rates is 51 bpm. INR is subtherapeutic at 1.8. His INR 5 days ago was also subtherapeutic at 1.8.   Current Outpatient Prescriptions  Medication Sig Dispense Refill  . aspirin EC 81 MG tablet Take 81 mg by mouth daily.    . carvedilol (COREG) 6.25 MG tablet Take 1 tablet (6.25 mg total) by mouth 2 (two) times daily with a meal. 180 tablet 3  . cyanocobalamin (,VITAMIN B-12,) 1000 MCG/ML injection Inject 1,000 mcg into the muscle every 30 (thirty) days.    . dorzolamide-timolol (COSOPT) 22.3-6.8 MG/ML ophthalmic solution 1 drop 2 (two) times daily.    . furosemide (LASIX) 20 MG tablet Take 1 tablet (20 mg total) by mouth daily as needed for edema (for edema and shortness of breath). 30 tablet 0  . latanoprost (XALATAN) 0.005 % ophthalmic solution Place 1 drop into both eyes at bedtime.    [name] levothyroxine (SYNTHROID, LEVOTHROID) 25 MCG tablet Take 1 tablet (25 mcg total) by mouth daily before breakfast. 30  tablet 3  . Multiple Vitamin (MULTIVITAMIN) tablet Take 1 tablet by mouth 2 (two) times daily.     . ramipril (ALTACE) 5 MG capsule Take 1 capsule (5 mg total) by mouth daily. 90 capsule 1  . traMADol (ULTRAM) 50 MG tablet Take 50 mg by mouth as needed. FOR PAIN    . warfarin (COUMADIN) 7.5 MG tablet Take 1  tablet by mouth daily or as directed by coumadin clinic 30 tablet 3   No current facility-administered medications for this visit.    No Known Allergies  Social History   Social History  . Marital Status: Married    Spouse Name: N/A  . Number of Children: N/A  . Years of Education: N/A   Occupational History  . Not on file.   Social History Main Topics  . Smoking status: Current Every Day Smoker    Types: Cigars  . Smokeless tobacco: Never Used     Comment: pt states he smokes 3-4 cigars every day  . Alcohol Use: 12.6 oz/week    21 Cans of beer per week  . Drug Use: No  . Sexual Activity: Not on file   Other Topics Concern  . Not on file   Social History Narrative     Review of Systems: General: negative for chills, fever, night sweats or weight changes.  Cardiovascular: negative for chest pain, dyspnea on exertion, edema, orthopnea, palpitations, paroxysmal nocturnal dyspnea or shortness of breath Dermatological: negative for rash Respiratory: negative for cough or wheezing Urologic: negative for hematuria Abdominal: negative for nausea, vomiting, diarrhea, bright red blood per rectum, melena, or hematemesis Neurologic: negative for visual changes, syncope, or dizziness All other systems reviewed and are otherwise negative except as noted above.    Blood pressure 140/52, pulse 53, height 5\' 11"  (1.803 m), weight 174 lb (78.926 kg).  General appearance: alert, cooperative and no distress Neck: no carotid bruit and no JVD Lungs: clear to auscultation bilaterally Heart: Irregularly irregular rhythm, bradycardic Extremities: trace LEE Pulses: 2+ and symmetric Skin: warm and dry  EKG atrial fibrillation with a slow ventricular response. Heart rate 51 bpm.  ASSESSMENT AND PLAN:   1. Chronic diastolic heart failure: Recent assessment of left ventricular systolic function revealed improvement in ejection fraction from 45% up to 55-60%. His lungs are clear to  auscultation bilaterally however he has trace bilateral lower extermity edema. His weight is slightly up compared to discharge weight. However, he denies any resting dyspnea. Continue diuretic therapy with Lasix as prescribed along with daily weights and low-sodium diet.  2. Atrial fibrillation with a slow ventricular response: HR is low at 51 bpm. I feel that his bradycardia may be contracting to his symptoms of fatigue. He is currently on 6.25 mg of carvedilol twice a day. He is on no other rate control agents. Recommended that he cut his carvedilol dose in half to 3.125 mg twice a day. He has been instructed to take an extra half tablet if he develops any symptoms of tachycardia/ palpitations. Ultimately, he may require direct current cardioversion to restore normal sinus rhythm however this will need to be delayed until he has 4 consecutive weeks of therapeutic INRs, or could pursue TEE guided direct current cardioversion. He is scheduled for repeat INR check in 1 week with our office pharmacist. Will also discuss plans regarding rate versus rhythm control strategy with his primary cardiologist Dr. Martinique.    PLAN  continue weekly INR checks with office pharmacist. Continue beta blocker therapy  for atrial fibrillation. Consider direct-current cardioversion. We'll discuss with his primary cardiologist Dr. Martinique if we will wait until 4 weeks of therapeutic INRs with Coumadin therapy versus TEE guided direct current cardioversion.  Lyda Jester PA-C 10/12/2014 2:44 PM

## 2014-10-20 ENCOUNTER — Other Ambulatory Visit: Payer: Self-pay | Admitting: Cardiology

## 2014-10-20 NOTE — Telephone Encounter (Signed)
Rx request sent to pharmacy.  

## 2014-10-21 ENCOUNTER — Ambulatory Visit (INDEPENDENT_AMBULATORY_CARE_PROVIDER_SITE_OTHER): Payer: Medicare HMO | Admitting: *Deleted

## 2014-10-21 ENCOUNTER — Telehealth: Payer: Self-pay

## 2014-10-21 ENCOUNTER — Ambulatory Visit (INDEPENDENT_AMBULATORY_CARE_PROVIDER_SITE_OTHER): Payer: Medicare HMO | Admitting: Pharmacist Clinician (PhC)/ Clinical Pharmacy Specialist

## 2014-10-21 DIAGNOSIS — I4891 Unspecified atrial fibrillation: Secondary | ICD-10-CM | POA: Diagnosis not present

## 2014-10-21 DIAGNOSIS — Z7901 Long term (current) use of anticoagulants: Secondary | ICD-10-CM

## 2014-10-21 LAB — POCT INR: INR: 2.2

## 2014-10-21 NOTE — Progress Notes (Signed)
Patient here for repeat EKG and INR.  EKG complete and interpreted by DOD  Dr. Oval Linsey  Patient without questions,  Ambulatory with wheeled walker device with wife

## 2014-10-21 NOTE — Telephone Encounter (Signed)
Patient was in office today.  Had his INR complete per Erasmo Downer.  EKG complete and reviewed by Dr. Oval Linsey DOD.

## 2014-10-30 ENCOUNTER — Ambulatory Visit (INDEPENDENT_AMBULATORY_CARE_PROVIDER_SITE_OTHER): Payer: Medicare HMO | Admitting: Pharmacist Clinician (PhC)/ Clinical Pharmacy Specialist

## 2014-10-30 DIAGNOSIS — I4891 Unspecified atrial fibrillation: Secondary | ICD-10-CM

## 2014-10-30 DIAGNOSIS — Z7901 Long term (current) use of anticoagulants: Secondary | ICD-10-CM | POA: Diagnosis not present

## 2014-10-30 LAB — POCT INR: INR: 2.1

## 2014-11-06 ENCOUNTER — Ambulatory Visit (INDEPENDENT_AMBULATORY_CARE_PROVIDER_SITE_OTHER): Payer: Medicare HMO | Admitting: Pharmacist Clinician (PhC)/ Clinical Pharmacy Specialist

## 2014-11-06 DIAGNOSIS — Z7901 Long term (current) use of anticoagulants: Secondary | ICD-10-CM

## 2014-11-06 DIAGNOSIS — I4891 Unspecified atrial fibrillation: Secondary | ICD-10-CM

## 2014-11-06 LAB — POCT INR: INR: 2.1

## 2014-11-10 ENCOUNTER — Ambulatory Visit (INDEPENDENT_AMBULATORY_CARE_PROVIDER_SITE_OTHER): Payer: Medicare HMO | Admitting: Physician Assistant

## 2014-11-10 ENCOUNTER — Ambulatory Visit (INDEPENDENT_AMBULATORY_CARE_PROVIDER_SITE_OTHER): Payer: Medicare HMO | Admitting: *Deleted

## 2014-11-10 ENCOUNTER — Encounter: Payer: Self-pay | Admitting: Physician Assistant

## 2014-11-10 VITALS — BP 126/60 | HR 60 | Ht 71.0 in | Wt 176.9 lb

## 2014-11-10 DIAGNOSIS — I10 Essential (primary) hypertension: Secondary | ICD-10-CM | POA: Diagnosis not present

## 2014-11-10 DIAGNOSIS — Z7901 Long term (current) use of anticoagulants: Secondary | ICD-10-CM | POA: Diagnosis not present

## 2014-11-10 DIAGNOSIS — I5022 Chronic systolic (congestive) heart failure: Secondary | ICD-10-CM | POA: Insufficient documentation

## 2014-11-10 DIAGNOSIS — I481 Persistent atrial fibrillation: Secondary | ICD-10-CM | POA: Diagnosis not present

## 2014-11-10 DIAGNOSIS — I4819 Other persistent atrial fibrillation: Secondary | ICD-10-CM

## 2014-11-10 DIAGNOSIS — I4891 Unspecified atrial fibrillation: Secondary | ICD-10-CM

## 2014-11-10 LAB — BASIC METABOLIC PANEL
BUN: 17 mg/dL (ref 7–25)
CHLORIDE: 101 mmol/L (ref 98–110)
CO2: 29 mmol/L (ref 20–31)
Calcium: 10 mg/dL (ref 8.6–10.3)
Creat: 1.13 mg/dL — ABNORMAL HIGH (ref 0.70–1.11)
GLUCOSE: 77 mg/dL (ref 65–99)
POTASSIUM: 5 mmol/L (ref 3.5–5.3)
Sodium: 134 mmol/L — ABNORMAL LOW (ref 135–146)

## 2014-11-10 LAB — CBC WITH DIFFERENTIAL/PLATELET
BASOS ABS: 0 10*3/uL (ref 0.0–0.1)
Basophils Relative: 0 % (ref 0–1)
Eosinophils Absolute: 0.1 10*3/uL (ref 0.0–0.7)
Eosinophils Relative: 3 % (ref 0–5)
HEMATOCRIT: 38.7 % — AB (ref 39.0–52.0)
HEMOGLOBIN: 13.2 g/dL (ref 13.0–17.0)
LYMPHS PCT: 21 % (ref 12–46)
Lymphs Abs: 0.9 10*3/uL (ref 0.7–4.0)
MCH: 34.6 pg — ABNORMAL HIGH (ref 26.0–34.0)
MCHC: 34.1 g/dL (ref 30.0–36.0)
MCV: 101.3 fL — ABNORMAL HIGH (ref 78.0–100.0)
MPV: 12.1 fL (ref 8.6–12.4)
Monocytes Absolute: 0.4 10*3/uL (ref 0.1–1.0)
Monocytes Relative: 10 % (ref 3–12)
NEUTROS ABS: 2.9 10*3/uL (ref 1.7–7.7)
NEUTROS PCT: 66 % (ref 43–77)
Platelets: 142 10*3/uL — ABNORMAL LOW (ref 150–400)
RBC: 3.82 MIL/uL — AB (ref 4.22–5.81)
RDW: 14.6 % (ref 11.5–15.5)
WBC: 4.4 10*3/uL (ref 4.0–10.5)

## 2014-11-10 LAB — POCT INR: INR: 2.2

## 2014-11-10 NOTE — Progress Notes (Signed)
Patient ID: [name], male   DOB: 15-Jan-1927, 81 y.o.   MRN: [id number]    Date:  11/10/2014   ID:  [name], DOB 1926-09-07, MRN [id number]  PCP:  Tamsen Roers, MD  Primary Cardiologist:  Martinique  Chief Complaint  Patient presents with  . Follow-up    a fib, c/o SOB, swelling in legs     History of Present Illness: [name] is a 81 y.o. male with a history of coronary disease and is status post CABG in 2000. Cardiac catheterization September 2012 showed patent grafts. He developed severe aortic stenosis and underwent aortic valve replacement with a tissue prosthesis. Prior to surgery he had congestive heart failure with ejection fraction 25%. Postoperatively his ejection fraction improved to 45%. He did have atrial fibrillation during that time and was placed on amiodarone and Coumadin. He was cardioverted in October of 2012. His amiodarone was then discontinued. He also has an abdominal aortic aneurysm and is status post stent grafting in 2009. He has a history of type II endoleak and had a coil procedure on one occasion. 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 This is being managed conservatively.   He was admitted to [facility] September 07, 2014 for evaluation of several days of increasing shortness of breath. He was found to be in acute on chronic CHF and was also noted to be back in atrial fibrillation. He was treated with IV diuretic therapy. Repeat 2-D echo showed improvement in left ventricular systolic function with EF at 55-60%. Mild left ventricular hypertrophy was noted. Visualization of his bioprosthetic aortic valve showed his prosthesis to be functioning properly. He was noted to have mild mitral regurgitation.  He responded well to IV diuretic therapy. His discharge weight was 170 pounds. Day of discharge, he continued in atrial fibrillation although his rate was well-controlled. He was continued on carvedilol and Coumadin  with plans to consider direct-current cardioversion as an outpatient if still in A. Fib.  He was seen in the clinic on August 23 for posthospital follow-up and at that time he complained of feeling very tired and fatigued.  No energy. INR was subtherapeutic at 1.8. Subsequent INRs: September 7- 2.2, September 16- 2.1, September 23- 2.1  He is here for one-month follow-up. He reports always feeling fatigued and tired.  He monitors his weight daily and takes Lasix as needed. He has he feels short of breath occasionally but denies dizziness. He does cough a lot.[name]  He denies any orthopnea, PND as well as nausea, vomiting, fever, chest pain,, PND, cough, congestion, abdominal pain, hematochezia, melena, lower extremity edema, claudication.  Wt Readings from Last 3 Encounters:  11/10/14 176 lb 14.4 oz (80.241 kg)  10/12/14 174 lb (78.926 kg)  09/10/14 170 lb 4.8 oz (77.248 kg)     Past Medical History  Diagnosis Date  . Atrial fibrillation     s/p cardioversion October 2012  . Hypertension   . AAA (abdominal aortic aneurysm)     s/p stent graft in September 2009 & with attempted embolization/occlusion of vessels for a type 2 leak around the stent graft; Managed by Dr. Oneida Alar; last CT in 2012 showing the leak had closed.   [name] History of carotid stenosis     s/p L CEA in September 2006  . Gout   . Arthritis   . Hyperlipidemia   . Wears glasses   . Coronary artery disease     Remote CABG x 5  in 2000  . Aortic stenosis     s/p AVR in September 2012 per Dr. Cyndia Bent  . PVC's (premature ventricular contractions)   . PAT (paroxysmal atrial tachycardia)   . Inguinal hernia     RIH  . Emphysema of lung   . High risk medication use     on amiodarone  . Chronic anticoagulation   . Gallstones     s/p cholecystectomy in Dec 2012  . Stroke 2006    three strokes -no residuals  . Urinary frequency     at night  . Anemia     on iron pills  . CHF (congestive heart failure)     SEPT AND OCT 2012;  follow up echo in October shows EF of 50%.     Current Outpatient Prescriptions  Medication Sig Dispense Refill  . aspirin EC 81 MG tablet Take 81 mg by mouth daily.    . carvedilol (COREG) 6.25 MG tablet Take 0.5 tablets (3.125 mg total) by mouth 2 (two) times daily with a meal. 180 tablet 3  . cyanocobalamin (,VITAMIN B-12,) 1000 MCG/ML injection Inject 1,000 mcg into the muscle every 30 (thirty) days.    . dorzolamide-timolol (COSOPT) 22.3-6.8 MG/ML ophthalmic solution 1 drop 2 (two) times daily.    . furosemide (LASIX) 20 MG tablet Take 1 tablet (20 mg total) by mouth daily as needed for edema (for edema and shortness of breath). 30 tablet 0  . latanoprost (XALATAN) 0.005 % ophthalmic solution Place 1 drop into both eyes at bedtime.    [name] levothyroxine (SYNTHROID, LEVOTHROID) 25 MCG tablet TAKE ONE TABLET BY MOUTH ONCE DAILY BEFORE  BREAKFAST 30 tablet 1  . Multiple Vitamin (MULTIVITAMIN) tablet Take 1 tablet by mouth 2 (two) times daily.     . ramipril (ALTACE) 5 MG capsule Take 1 capsule (5 mg total) by mouth daily. 90 capsule 1  . traMADol (ULTRAM) 50 MG tablet Take 50 mg by mouth as needed. FOR PAIN    . warfarin (COUMADIN) 7.5 MG tablet Take 1 tablet by mouth daily or as directed by coumadin clinic 30 tablet 3   No current facility-administered medications for this visit.    Allergies:   No Known Allergies  Social History:  The patient  reports that he has been smoking Cigars.  He has never used smokeless tobacco. He reports that he drinks about 12.6 oz of alcohol per week. He reports that he does not use illicit drugs.   Family history:   Family History  Problem Relation Age of Onset  . Other Mother     falopian tube during pregnancy   . Heart disease Father   . Cancer Brother     liver     ROS:  Please see the history of present illness.  All other systems reviewed and negative.   PHYSICAL EXAM: VS:  BP 126/60 mmHg  Pulse 60  Ht 5\' 11"  (1.803 m)  Wt 176 lb 14.4 oz  (80.241 kg)  BMI 24.68 kg/m2  SpO2 98% Well nourished, well developed, in no acute distress HEENT: Pupils are equal round react to light accommodation extraocular movements are intact.  Neck: no JVDNo cervical lymphadenopathy. Cardiac: Irregular rate and rhythm, no murmurs, S3, rate slow. Lungs:  clear to auscultation bilaterally, no wheezing, rhonchi or rales Abd: soft, nontender, positive bowel sounds all quadrants, no hepatosplenomegaly Ext: Trace lower extremity edema.  2+ radial and dorsalis pedis pulses. Skin: warm and dry Neuro:  Grossly normal  EKG:  A trivial fibrillation left bundle branch block, rate 60 bpm  ASSESSMENT AND PLAN:  Problem List Items Addressed This Visit    Long-term (current) use of anticoagulants   Hypertension   Chronic systolic heart failure   Atrial fibrillation - Primary     Persistent atrial fibrillation: Persistent fatigue could be related to atrial fibrillation.[name] He's had four consecutive weeks of therapeutic INR. DC CV scheduled for 1400 hrs. with Dr. Meda Coffee tomorrow Hypertension Blood pressure well-controlled  Chronic systolic heart failure Weight is up 2 pounds from his office visit 1 month ago. Doesn't have any JVD and is not complaining any orthopnea. Continue Lasix as needed based on weight.  He is on ACE inhibitor.

## 2014-11-10 NOTE — Patient Instructions (Signed)
Cardioversion scheduled at [facility] Wednesday 11/11/14 arrive at 12:30 pm  Follow instructions given.   Schedule post hospital follow up with a EKG in 2 weeks with a PA    Your physician wants you to follow-up with Dr.Jordan in 02/2015. You will receive a reminder letter in the mail two months in advance. If you don't receive a letter, please call our office to schedule the follow-up appointment.

## 2014-11-11 ENCOUNTER — Ambulatory Visit (HOSPITAL_COMMUNITY): Payer: Medicare HMO | Admitting: Certified Registered Nurse Anesthetist

## 2014-11-11 ENCOUNTER — Encounter (HOSPITAL_COMMUNITY): Payer: Self-pay | Admitting: *Deleted

## 2014-11-11 ENCOUNTER — Encounter (HOSPITAL_COMMUNITY)
Admission: RE | Disposition: A | Discharge status: Home / Self Care | Payer: Self-pay | Source: Ambulatory Visit | Attending: Cardiology

## 2014-11-11 ENCOUNTER — Ambulatory Visit (HOSPITAL_COMMUNITY)
Admission: RE | Admit: 2014-11-11 | Discharge: 2014-11-11 | Disposition: A | Discharge status: Home / Self Care | Payer: Medicare HMO | Source: Ambulatory Visit | Attending: Cardiology | Admitting: Cardiology

## 2014-11-11 DIAGNOSIS — I10 Essential (primary) hypertension: Secondary | ICD-10-CM | POA: Insufficient documentation

## 2014-11-11 DIAGNOSIS — Z7901 Long term (current) use of anticoagulants: Secondary | ICD-10-CM | POA: Insufficient documentation

## 2014-11-11 DIAGNOSIS — J439 Emphysema, unspecified: Secondary | ICD-10-CM | POA: Diagnosis not present

## 2014-11-11 DIAGNOSIS — Z7982 Long term (current) use of aspirin: Secondary | ICD-10-CM | POA: Diagnosis not present

## 2014-11-11 DIAGNOSIS — Z79899 Other long term (current) drug therapy: Secondary | ICD-10-CM | POA: Insufficient documentation

## 2014-11-11 DIAGNOSIS — I739 Peripheral vascular disease, unspecified: Secondary | ICD-10-CM | POA: Insufficient documentation

## 2014-11-11 DIAGNOSIS — I2510 Atherosclerotic heart disease of native coronary artery without angina pectoris: Secondary | ICD-10-CM | POA: Diagnosis not present

## 2014-11-11 DIAGNOSIS — Z951 Presence of aortocoronary bypass graft: Secondary | ICD-10-CM | POA: Insufficient documentation

## 2014-11-11 DIAGNOSIS — M109 Gout, unspecified: Secondary | ICD-10-CM | POA: Diagnosis not present

## 2014-11-11 DIAGNOSIS — D649 Anemia, unspecified: Secondary | ICD-10-CM | POA: Diagnosis not present

## 2014-11-11 DIAGNOSIS — E785 Hyperlipidemia, unspecified: Secondary | ICD-10-CM | POA: Insufficient documentation

## 2014-11-11 DIAGNOSIS — Z953 Presence of xenogenic heart valve: Secondary | ICD-10-CM | POA: Diagnosis not present

## 2014-11-11 DIAGNOSIS — I5022 Chronic systolic (congestive) heart failure: Secondary | ICD-10-CM | POA: Diagnosis not present

## 2014-11-11 DIAGNOSIS — I4891 Unspecified atrial fibrillation: Secondary | ICD-10-CM | POA: Diagnosis not present

## 2014-11-11 DIAGNOSIS — Z8673 Personal history of transient ischemic attack (TIA), and cerebral infarction without residual deficits: Secondary | ICD-10-CM | POA: Diagnosis not present

## 2014-11-11 DIAGNOSIS — I481 Persistent atrial fibrillation: Secondary | ICD-10-CM | POA: Diagnosis not present

## 2014-11-11 DIAGNOSIS — E039 Hypothyroidism, unspecified: Secondary | ICD-10-CM | POA: Diagnosis not present

## 2014-11-11 DIAGNOSIS — F17290 Nicotine dependence, other tobacco product, uncomplicated: Secondary | ICD-10-CM | POA: Insufficient documentation

## 2014-11-11 HISTORY — PX: CARDIOVERSION: SHX1299

## 2014-11-11 SURGERY — CARDIOVERSION
Anesthesia: General

## 2014-11-11 MED ORDER — SUCCINYLCHOLINE CHLORIDE 20 MG/ML IJ SOLN
INTRAMUSCULAR | Status: AC
  Filled 2014-11-11: qty 1

## 2014-11-11 MED ORDER — LIDOCAINE HCL (CARDIAC) 20 MG/ML IV SOLN
INTRAVENOUS | Status: AC
  Filled 2014-11-11: qty 5

## 2014-11-11 MED ORDER — EPHEDRINE SULFATE 50 MG/ML IJ SOLN
INTRAMUSCULAR | Status: AC
  Filled 2014-11-11: qty 1

## 2014-11-11 MED ORDER — PHENYLEPHRINE 40 MCG/ML (10ML) SYRINGE FOR IV PUSH (FOR BLOOD PRESSURE SUPPORT)
PREFILLED_SYRINGE | INTRAVENOUS | Status: AC
  Filled 2014-11-11: qty 10

## 2014-11-11 MED ORDER — STERILE WATER FOR INJECTION IJ SOLN
INTRAMUSCULAR | Status: AC
  Filled 2014-11-11: qty 10

## 2014-11-11 MED ORDER — PROPOFOL 10 MG/ML IV BOLUS
INTRAVENOUS | Status: AC
  Filled 2014-11-11: qty 20

## 2014-11-11 MED ORDER — LIDOCAINE HCL (CARDIAC) 20 MG/ML IV SOLN
INTRAVENOUS | Status: DC | PRN
  Administered 2014-11-11: 60 mg via INTRAVENOUS

## 2014-11-11 MED ORDER — PROPOFOL 10 MG/ML IV BOLUS
INTRAVENOUS | Status: DC | PRN
  Administered 2014-11-11: 30 mg via INTRAVENOUS
  Administered 2014-11-11: 60 mg via INTRAVENOUS

## 2014-11-11 MED ORDER — SODIUM CHLORIDE 0.9 % IV SOLN
INTRAVENOUS | Status: DC
  Administered 2014-11-11: 12:00:00 via INTRAVENOUS

## 2014-11-11 NOTE — Transfer of Care (Signed)
Immediate Anesthesia Transfer of Care Note  Patient: Fortino Sic  Procedure(s) Performed: Procedure(s): CARDIOVERSION (N/A)  Patient Location: PACU and Endoscopy Unit  Anesthesia Type:General  Level of Consciousness: awake and alert   Airway & Oxygen Therapy: Patient Spontanous Breathing and Patient connected to nasal cannula oxygen  Post-op Assessment: Report given to RN and Post -op Vital signs reviewed and stable  Post vital signs: Reviewed and stable  Last Vitals:  Filed Vitals:   11/11/14 1215  BP:   Pulse:   Temp:   Resp: 17    Complications: No apparent anesthesia complications

## 2014-11-11 NOTE — Anesthesia Preprocedure Evaluation (Addendum)
Anesthesia Evaluation  Patient identified by MRN, date of birth, ID band Patient awake    Reviewed: Allergy & Precautions, NPO status , Patient's Chart, lab work & pertinent test results  Airway Mallampati: II  TM Distance: >3 FB Neck ROM: Full    Dental  (+) Poor Dentition, Missing   Pulmonary COPD, Current Smoker,    breath sounds clear to auscultation       Cardiovascular hypertension, Pt. on medications + CAD, + Peripheral Vascular Disease and +CHF   Rhythm:Irregular     Neuro/Psych    GI/Hepatic   Endo/Other  Hypothyroidism   Renal/GU      Musculoskeletal  (+) Arthritis ,   Abdominal   Peds  Hematology  (+) anemia ,   Anesthesia Other Findings   Reproductive/Obstetrics                            Anesthesia Physical Anesthesia Plan  ASA: III  Anesthesia Plan: General   Post-op Pain Management:    Induction: Intravenous  Airway Management Planned: Mask  Additional Equipment:   Intra-op Plan:   Post-operative Plan:   Informed Consent: I have reviewed the patients History and Physical, chart, labs and discussed the procedure including the risks, benefits and alternatives for the proposed anesthesia with the patient or authorized representative who has indicated his/her understanding and acceptance.   Dental advisory given  Plan Discussed with:   Anesthesia Plan Comments:         Anesthesia Quick Evaluation

## 2014-11-11 NOTE — Discharge Instructions (Signed)

## 2014-11-11 NOTE — H&P (View-Only) (Signed)
Patient ID: JADON HARBAUGH, male   DOB: 02-Aug-1926, 81 y.o.   MRN: [id number]    Date:  11/10/2014   ID:  FATE CASTER, DOB [birth year], MRN [id number]  PCP:  Tamsen Roers, MD  Primary Cardiologist:  [place]  Chief Complaint  Patient presents with  . Follow-up    a fib, c/o SOB, swelling in legs     History of Present Illness: KEEON ZURN is a 81 y.o. male with a history of coronary disease and is status post CABG in 2000. Cardiac catheterization September 2012 showed patent grafts. He developed severe aortic stenosis and underwent aortic valve replacement with a tissue prosthesis. Prior to surgery he had congestive heart failure with ejection fraction 25%. Postoperatively his ejection fraction improved to 45%. He did have atrial fibrillation during that time and was placed on amiodarone and Coumadin. He was cardioverted in October of 2012. His amiodarone was then discontinued. He also has an abdominal aortic aneurysm and is status post stent grafting in 2009. He has a history of type II endoleak and had a coil procedure on one occasion. 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 This is being managed conservatively.   He was admitted to [facility] September 07, 2014 for evaluation of several days of increasing shortness of breath. He was found to be in acute on chronic CHF and was also noted to be back in atrial fibrillation. He was treated with IV diuretic therapy. Repeat 2-D echo showed improvement in left ventricular systolic function with EF at 55-60%. Mild left ventricular hypertrophy was noted. Visualization of his bioprosthetic aortic valve showed his prosthesis to be functioning properly. He was noted to have mild mitral regurgitation.  He responded well to IV diuretic therapy. His discharge weight was 170 pounds. Day of discharge, he continued in atrial fibrillation although his rate was well-controlled. He was continued on carvedilol and Coumadin  with plans to consider direct-current cardioversion as an outpatient if still in A. Fib.  He was seen in the clinic on August 23 for posthospital follow-up and at that time he complained of feeling very tired and fatigued.  No energy. INR was subtherapeutic at 1.8. Subsequent INRs: September 7- 2.2, September 16- 2.1, September 23- 2.1  He is here for one-month follow-up. He reports always feeling fatigued and tired.  He monitors his weight daily and takes Lasix as needed. He has he feels short of breath occasionally but denies dizziness. He does cough a lot.[name]  He denies any orthopnea, PND as well as nausea, vomiting, fever, chest pain,, PND, cough, congestion, abdominal pain, hematochezia, melena, lower extremity edema, claudication.  Wt Readings from Last 3 Encounters:  11/10/14 176 lb 14.4 oz (80.241 kg)  10/12/14 174 lb (78.926 kg)  09/10/14 170 lb 4.8 oz (77.248 kg)     Past Medical History  Diagnosis Date  . Atrial fibrillation     s/p cardioversion October 2012  . Hypertension   . AAA (abdominal aortic aneurysm)     s/p stent graft in September 2009 & with attempted embolization/occlusion of vessels for a type 2 leak around the stent graft; Managed by Dr. Oneida Alar; last CT in 2012 showing the leak had closed.   [name] History of carotid stenosis     s/p L CEA in September 2006  . Gout   . Arthritis   . Hyperlipidemia   . Wears glasses   . Coronary artery disease     Remote CABG x 5  in 2000  . Aortic stenosis     s/p AVR in September 2012 per Dr. Cyndia Bent  . PVC's (premature ventricular contractions)   . PAT (paroxysmal atrial tachycardia)   . Inguinal hernia     RIH  . Emphysema of lung   . High risk medication use     on amiodarone  . Chronic anticoagulation   . Gallstones     s/p cholecystectomy in Dec 2012  . Stroke 2006    three strokes -no residuals  . Urinary frequency     at night  . Anemia     on iron pills  . CHF (congestive heart failure)     SEPT AND OCT 2012;  follow up echo in October shows EF of 50%.     Current Outpatient Prescriptions  Medication Sig Dispense Refill  . aspirin EC 81 MG tablet Take 81 mg by mouth daily.    . carvedilol (COREG) 6.25 MG tablet Take 0.5 tablets (3.125 mg total) by mouth 2 (two) times daily with a meal. 180 tablet 3  . cyanocobalamin (,VITAMIN B-12,) 1000 MCG/ML injection Inject 1,000 mcg into the muscle every 30 (thirty) days.    . dorzolamide-timolol (COSOPT) 22.3-6.8 MG/ML ophthalmic solution 1 drop 2 (two) times daily.    . furosemide (LASIX) 20 MG tablet Take 1 tablet (20 mg total) by mouth daily as needed for edema (for edema and shortness of breath). 30 tablet 0  . latanoprost (XALATAN) 0.005 % ophthalmic solution Place 1 drop into both eyes at bedtime.    [name] levothyroxine (SYNTHROID, LEVOTHROID) 25 MCG tablet TAKE ONE TABLET BY MOUTH ONCE DAILY BEFORE  BREAKFAST 30 tablet 1  . Multiple Vitamin (MULTIVITAMIN) tablet Take 1 tablet by mouth 2 (two) times daily.     . ramipril (ALTACE) 5 MG capsule Take 1 capsule (5 mg total) by mouth daily. 90 capsule 1  . traMADol (ULTRAM) 50 MG tablet Take 50 mg by mouth as needed. FOR PAIN    . warfarin (COUMADIN) 7.5 MG tablet Take 1 tablet by mouth daily or as directed by coumadin clinic 30 tablet 3   No current facility-administered medications for this visit.    Allergies:   No Known Allergies  Social History:  The patient  reports that he has been smoking Cigars.  He has never used smokeless tobacco. He reports that he drinks about 12.6 oz of alcohol per week. He reports that he does not use illicit drugs.   Family history:   Family History  Problem Relation Age of Onset  . Other Mother     falopian tube during pregnancy   . Heart disease Father   . Cancer Brother     liver     ROS:  Please see the history of present illness.  All other systems reviewed and negative.   PHYSICAL EXAM: VS:  BP 126/60 mmHg  Pulse 60  Ht 5\' 11"  (1.803 m)  Wt 176 lb 14.4 oz  (80.241 kg)  BMI 24.68 kg/m2  SpO2 98% Well nourished, well developed, in no acute distress HEENT: Pupils are equal round react to light accommodation extraocular movements are intact.  Neck: no JVDNo cervical lymphadenopathy. Cardiac: Irregular rate and rhythm, no murmurs, S3, rate slow. Lungs:  clear to auscultation bilaterally, no wheezing, rhonchi or rales Abd: soft, nontender, positive bowel sounds all quadrants, no hepatosplenomegaly Ext: Trace lower extremity edema.  2+ radial and dorsalis pedis pulses. Skin: warm and dry Neuro:  Grossly normal  EKG:  A trivial fibrillation left bundle branch block, rate 60 bpm  ASSESSMENT AND PLAN:  Problem List Items Addressed This Visit    Long-term (current) use of anticoagulants   Hypertension   Chronic systolic heart failure   Atrial fibrillation - Primary     Persistent atrial fibrillation: Persistent fatigue could be related to atrial fibrillation.[name] He's had four consecutive weeks of therapeutic INR. DC CV scheduled for 1400 hrs. with Dr. Meda Coffee tomorrow Hypertension Blood pressure well-controlled  Chronic systolic heart failure Weight is up 2 pounds from his office visit 1 month ago. Doesn't have any JVD and is not complaining any orthopnea. Continue Lasix as needed based on weight.  He is on ACE inhibitor.

## 2014-11-11 NOTE — CV Procedure (Signed)
    Cardioversion Note  [name] [id number] 04/26/1926  Procedure: DC Cardioversion Indications: atrial fibrillation  Procedure Details Consent: Obtained Time Out: Verified patient identification, verified procedure, site/side was marked, verified correct patient position, special equipment/implants available, Radiology Safety Procedures followed,  medications/allergies/relevent history reviewed, required imaging and test results available.  Performed  The patient has been on adequate anticoagulation.  The patient received IV Propofol by anesthesia staff for sedation.  Synchronous cardioversion was performed at 120 joules.  The cardioversion was successful.   Complications: No apparent complications Patient did tolerate procedure well.   [name], MD, [facility] 11/11/2014, 12:30 PM

## 2014-11-11 NOTE — Anesthesia Postprocedure Evaluation (Signed)
  Anesthesia Post-op Note  Patient: [name]  Procedure(s) Performed: Procedure(s): CARDIOVERSION (N/A)  Patient Location: PACU  Anesthesia Type:General  Level of Consciousness: awake and alert   Airway and Oxygen Therapy: Patient Spontanous Breathing  Post-op Pain: Controlled  Post-op Assessment: Post-op Vital signs reviewed, Patient's Cardiovascular Status Stable and Respiratory Function Stable  Post-op Vital Signs: Reviewed  Filed Vitals:   11/11/14 1230  BP: 161/70  Pulse: 79  Temp:   Resp: 18    Complications: No apparent anesthesia complications

## 2014-11-11 NOTE — Interval H&P Note (Signed)
History and Physical Interval Note:  11/11/2014 12:30 PM  [name]  has presented today for surgery, with the diagnosis of AFIB  The various methods of treatment have been discussed with the patient and family. After consideration of risks, benefits and other options for treatment, the patient has consented to  Procedure(s): CARDIOVERSION (N/A) as a surgical intervention .  The patient's history has been reviewed, patient examined, no change in status, stable for surgery.  I have reviewed the patient's chart and labs.  Questions were answered to the patient's satisfaction.     [name]

## 2014-11-12 ENCOUNTER — Encounter (HOSPITAL_COMMUNITY): Payer: Self-pay | Admitting: Cardiology

## 2014-11-25 ENCOUNTER — Ambulatory Visit (INDEPENDENT_AMBULATORY_CARE_PROVIDER_SITE_OTHER): Payer: Medicare HMO | Admitting: Pharmacist

## 2014-11-25 ENCOUNTER — Ambulatory Visit (INDEPENDENT_AMBULATORY_CARE_PROVIDER_SITE_OTHER): Payer: Medicare HMO | Admitting: Cardiology

## 2014-11-25 ENCOUNTER — Encounter: Payer: Self-pay | Admitting: Cardiology

## 2014-11-25 VITALS — BP 132/74 | HR 79 | Ht 71.0 in | Wt 175.4 lb

## 2014-11-25 DIAGNOSIS — I481 Persistent atrial fibrillation: Secondary | ICD-10-CM

## 2014-11-25 DIAGNOSIS — I6523 Occlusion and stenosis of bilateral carotid arteries: Secondary | ICD-10-CM

## 2014-11-25 DIAGNOSIS — Z952 Presence of prosthetic heart valve: Secondary | ICD-10-CM

## 2014-11-25 DIAGNOSIS — I4891 Unspecified atrial fibrillation: Secondary | ICD-10-CM

## 2014-11-25 DIAGNOSIS — I5022 Chronic systolic (congestive) heart failure: Secondary | ICD-10-CM | POA: Diagnosis not present

## 2014-11-25 DIAGNOSIS — I714 Abdominal aortic aneurysm, without rupture, unspecified: Secondary | ICD-10-CM

## 2014-11-25 DIAGNOSIS — I2510 Atherosclerotic heart disease of native coronary artery without angina pectoris: Secondary | ICD-10-CM

## 2014-11-25 DIAGNOSIS — Z7901 Long term (current) use of anticoagulants: Secondary | ICD-10-CM

## 2014-11-25 DIAGNOSIS — I4819 Other persistent atrial fibrillation: Secondary | ICD-10-CM

## 2014-11-25 DIAGNOSIS — Z954 Presence of other heart-valve replacement: Secondary | ICD-10-CM

## 2014-11-25 LAB — POCT INR: INR: 2.1

## 2014-11-25 NOTE — Progress Notes (Signed)
11/25/2014 [name]   1926/12/25  [id number]  Primary Physician Tamsen Roers, MD Primary Cardiologist: Dr. [place]   Reason for Visit/CC:  F/u for  Atrial Fibrillation  HPI:  The patient is a 81 year old male seen for follow up s/p DCCV for atrial fibrillation. He has a history of coronary disease and is status post CABG in 2000. Cardiac catheterization September 2012 showed patent grafts. He developed severe aortic stenosis and underwent aortic valve replacement with a tissue prosthesis. Prior to surgery he had congestive heart failure with ejection fraction 25%. Postoperatively his ejection fraction improved to 45%. He did have atrial fibrillation during that time and was placed on amiodarone and Coumadin. He was cardioverted in October of 2012. His amiodarone was then discontinued. He also has an abdominal aortic aneurysm and is status post stent grafting in 2009. He has a history of type II endoleak and had a coil procedure on one occasion. 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 This is being managed conservatively. he is s/p left CEA.  In July he was admitted with acute diastolic CHF and atrial fibrillation. He was severely hypertensive. He was treated with rate control and anticoagulation. He underwent DCCV on 11/11/14. Says he can't tell a lot of difference but his wife states he is able to walk with a walker to the end of their road (1000 ft) and he could not do this before. Still feels bad in the morning and it takes him a while to get going.   Current Outpatient Prescriptions  Medication Sig Dispense Refill  . aspirin EC 81 MG tablet Take 81 mg by mouth daily.    . carvedilol (COREG) 6.25 MG tablet Take 0.5 tablets (3.125 mg total) by mouth 2 (two) times daily with a meal. 180 tablet 3  . cyanocobalamin (,VITAMIN B-12,) 1000 MCG/ML injection Inject 1,000 mcg into the muscle every 30 (thirty) days.    . dorzolamide-timolol (COSOPT) 22.3-6.8 MG/ML  ophthalmic solution 1 drop 2 (two) times daily.    . furosemide (LASIX) 20 MG tablet Take 1 tablet (20 mg total) by mouth daily as needed for edema (for edema and shortness of breath). 30 tablet 0  . latanoprost (XALATAN) 0.005 % ophthalmic solution Place 1 drop into both eyes at bedtime.    [name] levothyroxine (SYNTHROID, LEVOTHROID) 25 MCG tablet TAKE ONE TABLET BY MOUTH ONCE DAILY BEFORE  BREAKFAST 30 tablet 1  . Multiple Vitamin (MULTIVITAMIN) tablet Take 1 tablet by mouth 2 (two) times daily.     . ramipril (ALTACE) 5 MG capsule Take 1 capsule (5 mg total) by mouth daily. 90 capsule 1  . warfarin (COUMADIN) 7.5 MG tablet Take 1 tablet by mouth daily or as directed by coumadin clinic 30 tablet 3   No current facility-administered medications for this visit.    No Known Allergies  Social History   Social History  . Marital Status: Married    Spouse Name: N/A  . Number of Children: N/A  . Years of Education: N/A   Occupational History  . Not on file.   Social History Main Topics  . Smoking status: Current Every Day Smoker    Types: Cigars  . Smokeless tobacco: Never Used     Comment: pt states he smokes 3-4 cigars every day  . Alcohol Use: 12.6 oz/week    21 Cans of beer per week  . Drug Use: No  . Sexual Activity: Not on file   Other Topics Concern  .  Not on file   Social History Narrative     Review of Systems: General: negative for chills, fever, night sweats or weight changes.  Cardiovascular: negative for chest pain, dyspnea on exertion, edema, orthopnea, palpitations, paroxysmal nocturnal dyspnea or shortness of breath Dermatological: negative for rash Respiratory: negative for cough or wheezing Urologic: negative for hematuria Abdominal: negative for nausea, vomiting, diarrhea, bright red blood per rectum, melena, or hematemesis Neurologic: negative for visual changes, syncope, or dizziness All other systems reviewed and are otherwise negative except as noted  above.    Blood pressure 132/74, pulse 79, height 5\' 11"  (1.803 m), weight 79.561 kg (175 lb 6.4 oz).  GENERAL:  Elderly WM in NAD.  HEENT:  PERRL, EOMI, sclera are clear. Oropharynx is clear. Missing several upper teeth. NECK:  No jugular venous distention, carotid upstroke brisk and symmetric, no bruits, no thyromegaly or adenopathy LUNGS:  Clear to auscultation bilaterally CHEST:  Unremarkable HEART:  RRR,  PMI not displaced or sustained,S1 and S2 within normal limits, no S3, no S4: no clicks, no rubs, no murmurs ABD:  Soft, nontender. BS +, no masses or bruits. No hepatomegaly, no splenomegaly EXT:  2 + pulses throughout, no edema, no cyanosis no clubbing SKIN:  Warm and dry.  No rashes NEURO:  Alert and oriented x 3. Cranial nerves II through XII intact. PSYCH:  Cognitively intact    EKG today shows NSR with LBBB. First degree AV block. I have personally reviewed and interpreted this study.  Lab Results  Component Value Date   WBC 4.4 11/10/2014   HGB 13.2 11/10/2014   HCT 38.7* 11/10/2014   PLT 142* 11/10/2014   GLUCOSE 77 11/10/2014   CHOL 147 07/31/2011   TRIG 64.0 07/31/2011   HDL 60.50 07/31/2011   LDLCALC 74 07/31/2011   ALT 18 09/07/2014   AST 26 09/07/2014   NA 134* 11/10/2014   K 5.0 11/10/2014   CL 101 11/10/2014   CREATININE 1.13* 11/10/2014   BUN 17 11/10/2014   CO2 29 11/10/2014   TSH 5.85* 11/20/2011   INR 2.1 11/25/2014   Echo: 09/09/14:Study Conclusions  - HPI and indications: Afib with pauses noted during the study. - Left ventricle: The cavity size was normal. Wall thickness was increased in a pattern of mild LVH. Systolic function was normal. The estimated ejection fraction was in the range of 55% to 60%. The study is not technically sufficient to allow evaluation of LV diastolic function. - Aortic valve: Bioprosthetic AVR. No obstruction. Trivial AI. - Mitral valve: Calcified annulus. Mildly thickened leaflets . There was mild  regurgitation. - Left atrium: Severely dilated at 62 ml/m2. - Right ventricle: The cavity size was mildly dilated. Systolic function is reduced. - Right atrium: Moderately dilated at 26 cm2. - Tricuspid valve: There was mild regurgitation. - Pulmonary arteries: PA peak pressure: 29 mm Hg (S). - Inferior vena cava: The vessel was normal in size. The respirophasic diameter changes were in the normal range (>= 50%), consistent with normal central venous pressure.  Impressions:  - Compared to a prior study in 2012, the LVEF is improved to 55-60%, however, there are pauses noted and the underlying rhythm is a-fib. There is no evidence for obstruction of the bioprosthetic AVR. There is severe LAE and moderate RAE.  ASSESSMENT AND PLAN:   1. Chronic diastolic heart failure: Appears to be euvolemic on exam. Continue diuretic therapy with Lasix as prescribed along with daily weights and low-sodium diet.  2. Atrial fibrillation with  a slow ventricular response:  Now back in NSR s/p successful DCCV. Continue beta blocker and coumadin. INR therapeutic at 2.1. If afib recurs will need to assess symptom status. If asymptomatic would treat with rate control and anticoagulation. If symptomatic would resume amiodarone and repeat DCCV.   3. AS s/p tissue AVR in 9/12. Echo showed good valve function.  4. CAD s/p CABG in 2000. Cath in 2012 showed patent grafts. No angina.  5. AAA s/p endograft with persistent endoleak. Manage conservatively.  6. S/p left CEA. Last dopplers 3 years ago. Will update.   7. Spinal stenosis. S/p surgery.       Decari Duggar [place] MD,FACC  11/25/2014 3:13 PM

## 2014-11-25 NOTE — Patient Instructions (Signed)
Continue your current therapy  We will schedule carotid dopplers  I will see you in 3 months.

## 2014-11-30 ENCOUNTER — Ambulatory Visit (HOSPITAL_COMMUNITY)
Admission: RE | Admit: 2014-11-30 | Discharge: 2014-11-30 | Disposition: A | Discharge status: Home / Self Care | Payer: Medicare HMO | Source: Ambulatory Visit | Attending: Cardiology | Admitting: Cardiology

## 2014-11-30 DIAGNOSIS — I2510 Atherosclerotic heart disease of native coronary artery without angina pectoris: Secondary | ICD-10-CM | POA: Diagnosis not present

## 2014-11-30 DIAGNOSIS — E785 Hyperlipidemia, unspecified: Secondary | ICD-10-CM | POA: Insufficient documentation

## 2014-11-30 DIAGNOSIS — I6523 Occlusion and stenosis of bilateral carotid arteries: Secondary | ICD-10-CM

## 2014-11-30 DIAGNOSIS — I10 Essential (primary) hypertension: Secondary | ICD-10-CM | POA: Diagnosis not present

## 2014-11-30 DIAGNOSIS — I6521 Occlusion and stenosis of right carotid artery: Secondary | ICD-10-CM | POA: Insufficient documentation

## 2014-12-08 ENCOUNTER — Ambulatory Visit: Payer: Medicare HMO | Admitting: Nurse Practitioner

## 2014-12-21 ENCOUNTER — Other Ambulatory Visit: Payer: Self-pay | Admitting: Cardiology

## 2014-12-21 MED ORDER — LEVOTHYROXINE SODIUM 25 MCG PO TABS: ORAL_TABLET | ORAL | Status: DC

## 2014-12-21 MED ORDER — RAMIPRIL 5 MG PO CAPS: 5.0000 mg | ORAL_CAPSULE | Freq: Every day | ORAL | Status: DC

## 2014-12-21 NOTE — Telephone Encounter (Signed)
°*  STAT* If patient is at the pharmacy, call can be transferred to refill team.   1. Which medications need to be refilled? (please list name of each medication and dose if known) Levothryoxine 23mcg( 1 tb po daily) Ramipril( 1 cap po daily)  2. Which pharmacy/location (including street and city if local pharmacy) is medication to be sent to?[facility] on [address]  3. Do they need a 30 day or 90 day supply? He would like 90 for both

## 2014-12-21 NOTE — Telephone Encounter (Signed)
Pt's Rx sent to his pharmacy

## 2014-12-23 ENCOUNTER — Ambulatory Visit: Payer: Medicare HMO | Admitting: Pharmacist Clinician (PhC)/ Clinical Pharmacy Specialist

## 2014-12-25 ENCOUNTER — Ambulatory Visit (INDEPENDENT_AMBULATORY_CARE_PROVIDER_SITE_OTHER): Payer: Medicare HMO | Admitting: Pharmacist Clinician (PhC)/ Clinical Pharmacy Specialist

## 2014-12-25 DIAGNOSIS — I4891 Unspecified atrial fibrillation: Secondary | ICD-10-CM

## 2014-12-25 DIAGNOSIS — I481 Persistent atrial fibrillation: Secondary | ICD-10-CM | POA: Diagnosis not present

## 2014-12-25 DIAGNOSIS — Z7901 Long term (current) use of anticoagulants: Secondary | ICD-10-CM | POA: Diagnosis not present

## 2014-12-25 DIAGNOSIS — I4819 Other persistent atrial fibrillation: Secondary | ICD-10-CM

## 2014-12-25 LAB — POCT INR: INR: 2.2

## 2015-01-19 ENCOUNTER — Other Ambulatory Visit: Payer: Self-pay | Admitting: Cardiology

## 2015-01-22 ENCOUNTER — Ambulatory Visit (INDEPENDENT_AMBULATORY_CARE_PROVIDER_SITE_OTHER): Payer: Medicare HMO | Admitting: Pharmacist Clinician (PhC)/ Clinical Pharmacy Specialist

## 2015-01-22 DIAGNOSIS — I4891 Unspecified atrial fibrillation: Secondary | ICD-10-CM | POA: Diagnosis not present

## 2015-01-22 DIAGNOSIS — I481 Persistent atrial fibrillation: Secondary | ICD-10-CM

## 2015-01-22 DIAGNOSIS — I4819 Other persistent atrial fibrillation: Secondary | ICD-10-CM

## 2015-01-22 DIAGNOSIS — Z7901 Long term (current) use of anticoagulants: Secondary | ICD-10-CM

## 2015-01-22 LAB — POCT INR: INR: 1.9

## 2015-02-10 ENCOUNTER — Ambulatory Visit (INDEPENDENT_AMBULATORY_CARE_PROVIDER_SITE_OTHER): Payer: Medicare HMO | Admitting: Pharmacist Clinician (PhC)/ Clinical Pharmacy Specialist

## 2015-02-10 DIAGNOSIS — Z7901 Long term (current) use of anticoagulants: Secondary | ICD-10-CM

## 2015-02-10 DIAGNOSIS — I481 Persistent atrial fibrillation: Secondary | ICD-10-CM

## 2015-02-10 DIAGNOSIS — I4891 Unspecified atrial fibrillation: Secondary | ICD-10-CM

## 2015-02-10 DIAGNOSIS — I4819 Other persistent atrial fibrillation: Secondary | ICD-10-CM

## 2015-02-10 LAB — POCT INR: INR: 2.2

## 2015-03-01 ENCOUNTER — Ambulatory Visit (INDEPENDENT_AMBULATORY_CARE_PROVIDER_SITE_OTHER): Payer: Medicare HMO | Admitting: Pharmacist Clinician (PhC)/ Clinical Pharmacy Specialist

## 2015-03-01 ENCOUNTER — Ambulatory Visit: Payer: Medicare HMO | Admitting: Cardiology

## 2015-03-01 ENCOUNTER — Encounter: Payer: Self-pay | Admitting: Cardiology

## 2015-03-01 ENCOUNTER — Ambulatory Visit (INDEPENDENT_AMBULATORY_CARE_PROVIDER_SITE_OTHER): Payer: Medicare HMO | Admitting: Cardiology

## 2015-03-01 VITALS — BP 140/68 | HR 76 | Ht 71.0 in | Wt 176.4 lb

## 2015-03-01 DIAGNOSIS — I10 Essential (primary) hypertension: Secondary | ICD-10-CM | POA: Diagnosis not present

## 2015-03-01 DIAGNOSIS — I481 Persistent atrial fibrillation: Secondary | ICD-10-CM | POA: Diagnosis not present

## 2015-03-01 DIAGNOSIS — Z954 Presence of other heart-valve replacement: Secondary | ICD-10-CM

## 2015-03-01 DIAGNOSIS — I5022 Chronic systolic (congestive) heart failure: Secondary | ICD-10-CM | POA: Diagnosis not present

## 2015-03-01 DIAGNOSIS — I25810 Atherosclerosis of coronary artery bypass graft(s) without angina pectoris: Secondary | ICD-10-CM | POA: Diagnosis not present

## 2015-03-01 DIAGNOSIS — I4891 Unspecified atrial fibrillation: Secondary | ICD-10-CM

## 2015-03-01 DIAGNOSIS — I4819 Other persistent atrial fibrillation: Secondary | ICD-10-CM

## 2015-03-01 DIAGNOSIS — Z7901 Long term (current) use of anticoagulants: Secondary | ICD-10-CM | POA: Diagnosis not present

## 2015-03-01 DIAGNOSIS — Z952 Presence of prosthetic heart valve: Secondary | ICD-10-CM

## 2015-03-01 LAB — POCT INR: INR: 1.8

## 2015-03-01 NOTE — Patient Instructions (Signed)
Continue your current therapy  I will see you in 6 months.   

## 2015-03-01 NOTE — Progress Notes (Signed)
03/01/2015 [name]   1926-03-14  [id number]  Primary Physician Tamsen Roers, MD Primary Cardiologist: Dr. Martinique   Reason for Visit/CC:  F/u for  Atrial Fibrillation  HPI:  The patient is a 82 year old male seen for follow up  atrial fibrillation. He has a history of coronary disease and is status post CABG in 2000. Cardiac catheterization September 2012 showed patent grafts. He developed severe aortic stenosis and underwent aortic valve replacement with a tissue prosthesis. Prior to surgery he had congestive heart failure with ejection fraction 25%. Postoperatively his ejection fraction improved to 45%. He did have atrial fibrillation during that time and was placed on amiodarone and Coumadin. He was cardioverted in October of 2012. His amiodarone was then discontinued. He also has an abdominal aortic aneurysm and is status post stent grafting in 2009. He has a history of type II endoleak and had a coil procedure on one occasion. 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 This is being managed conservatively. He is s/p left CEA.  In July 2016 he was admitted with acute diastolic CHF and atrial fibrillation. He was severely hypertensive. He was treated with rate control and anticoagulation. He underwent DCCV on 11/11/14.  On follow up today he is doing well. His wife notes that his mood has improved and he is not talking about dying every day. He denies any SOB or chest pain. Sometimes just doesn't feel well. Notes he spits up phlegm at times. No fever.   Current Outpatient Prescriptions  Medication Sig Dispense Refill  . aspirin EC 81 MG tablet Take 81 mg by mouth daily.    . carvedilol (COREG) 6.25 MG tablet Take 0.5 tablets (3.125 mg total) by mouth 2 (two) times daily with a meal. 180 tablet 3  . clotrimazole ([facility] ITCH RELIEF) 1 % cream Apply 1 application topically as needed.    . cyanocobalamin (,VITAMIN B-12,) 1000 MCG/ML injection Inject 1,000 mcg into  the muscle every 30 (thirty) days.    . dorzolamide-timolol (COSOPT) 22.3-6.8 MG/ML ophthalmic solution 1 drop 2 (two) times daily.    . furosemide (LASIX) 20 MG tablet Take 1 tablet (20 mg total) by mouth daily as needed for edema (for edema and shortness of breath). 30 tablet 0  . latanoprost (XALATAN) 0.005 % ophthalmic solution Place 1 drop into both eyes at bedtime.    [name] levothyroxine (SYNTHROID, LEVOTHROID) 25 MCG tablet TAKE ONE TABLET BY MOUTH ONCE DAILY BEFORE  BREAKFAST 30 tablet 2  . Multiple Vitamin (MULTIVITAMIN) tablet Take 1 tablet by mouth 2 (two) times daily.     . Naproxen Sodium (ALEVE) 220 MG CAPS Take 1 capsule by mouth as needed.    . Oxymetazoline HCl (NASAL SPRAY) 0.05 % SOLN Place into the nose as needed.    . ramipril (ALTACE) 5 MG capsule Take 1 capsule (5 mg total) by mouth daily. 90 capsule 0  . warfarin (COUMADIN) 7.5 MG tablet Take 1 to 1.5 tablets by mouth daily as directed by coumadin clinic 40 tablet 3   No current facility-administered medications for this visit.    No Known Allergies  Social History   Social History  . Marital Status: Married    Spouse Name: N/A  . Number of Children: N/A  . Years of Education: N/A   Occupational History  . Not on file.   Social History Main Topics  . Smoking status: Current Every Day Smoker    Types: Cigars  . Smokeless tobacco: Never  Used     Comment: pt states he smokes 3-4 cigars every day  . Alcohol Use: 12.6 oz/week    21 Cans of beer per week  . Drug Use: No  . Sexual Activity: Not on file   Other Topics Concern  . Not on file   Social History Narrative     Review of Systems: As noted in HPI. All other systems reviewed and are otherwise negative except as noted above.    Blood pressure 140/68, pulse 76, height 5\' 11"  (1.803 m), weight 80.003 kg (176 lb 6 oz).  GENERAL:  Elderly WM in NAD.  HEENT:  PERRL, EOMI, sclera are clear. Oropharynx is clear. Missing several upper teeth. NECK:  No  jugular venous distention, carotid upstroke brisk and symmetric, no bruits, no thyromegaly or adenopathy LUNGS:  Clear to auscultation bilaterally CHEST:  Unremarkable HEART:  RRR,  PMI not displaced or sustained,S1 and S2 within normal limits, no S3, no S4: no clicks, no rubs, no murmurs ABD:  Soft, nontender. BS +, no masses or bruits. No hepatomegaly, no splenomegaly EXT:  2 + pulses throughout, no edema, no cyanosis no clubbing SKIN:  Warm and dry.  No rashes NEURO:  Alert and oriented x 3. Cranial nerves II through XII intact. PSYCH:  Cognitively intact    Lab Results  Component Value Date   WBC 4.4 11/10/2014   HGB 13.2 11/10/2014   HCT 38.7* 11/10/2014   PLT 142* 11/10/2014   GLUCOSE 77 11/10/2014   CHOL 147 07/31/2011   TRIG 64.0 07/31/2011   HDL 60.50 07/31/2011   LDLCALC 74 07/31/2011   ALT 18 09/07/2014   AST 26 09/07/2014   NA 134* 11/10/2014   K 5.0 11/10/2014   CL 101 11/10/2014   CREATININE 1.13* 11/10/2014   BUN 17 11/10/2014   CO2 29 11/10/2014   TSH 5.85* 11/20/2011   INR 1.8 03/01/2015   Echo: 09/09/14:Study Conclusions  - HPI and indications: Afib with pauses noted during the study. - Left ventricle: The cavity size was normal. Wall thickness was increased in a pattern of mild LVH. Systolic function was normal. The estimated ejection fraction was in the range of 55% to 60%. The study is not technically sufficient to allow evaluation of LV diastolic function. - Aortic valve: Bioprosthetic AVR. No obstruction. Trivial AI. - Mitral valve: Calcified annulus. Mildly thickened leaflets . There was mild regurgitation. - Left atrium: Severely dilated at 62 ml/m2. - Right ventricle: The cavity size was mildly dilated. Systolic function is reduced. - Right atrium: Moderately dilated at 26 cm2. - Tricuspid valve: There was mild regurgitation. - Pulmonary arteries: PA peak pressure: 29 mm Hg (S). - Inferior vena cava: The vessel was normal in  size. The respirophasic diameter changes were in the normal range (>= 50%), consistent with normal central venous pressure.  Impressions:  - Compared to a prior study in 2012, the LVEF is improved to 55-60%, however, there are pauses noted and the underlying rhythm is a-fib. There is no evidence for obstruction of the bioprosthetic AVR. There is severe LAE and moderate RAE.  ASSESSMENT AND PLAN:   1. Chronic diastolic heart failure: Appears to be euvolemic on exam. Continue diuretic therapy with Lasix as prescribed along with daily weights and low-sodium diet.  2. Atrial fibrillation with a slow ventricular response:  s/p successful DCCV in September. Rhythm is regular today. Continue beta blocker and coumadin. INR 1.8 today and dose of coumadin adjusted.   3. AS s/p  tissue AVR in 9/12. Echo showed good valve function.  4. CAD s/p CABG in 2000. Cath in 2012 showed patent grafts. No angina.  5. AAA s/p endograft with persistent endoleak. Manage conservatively.  6. S/p left CEA. Carotid dopplers in October showed no significant stenosis.  7. Spinal stenosis. S/p surgery.       Mang Hazelrigg Martinique MD,FACC  03/01/2015 5:38 PM

## 2015-03-15 ENCOUNTER — Ambulatory Visit (INDEPENDENT_AMBULATORY_CARE_PROVIDER_SITE_OTHER): Payer: Medicare HMO | Admitting: Pharmacist Clinician (PhC)/ Clinical Pharmacy Specialist

## 2015-03-15 DIAGNOSIS — I4891 Unspecified atrial fibrillation: Secondary | ICD-10-CM | POA: Diagnosis not present

## 2015-03-15 DIAGNOSIS — Z7901 Long term (current) use of anticoagulants: Secondary | ICD-10-CM | POA: Diagnosis not present

## 2015-03-15 DIAGNOSIS — I481 Persistent atrial fibrillation: Secondary | ICD-10-CM

## 2015-03-15 DIAGNOSIS — I4819 Other persistent atrial fibrillation: Secondary | ICD-10-CM

## 2015-03-15 LAB — POCT INR: INR: 2.7

## 2015-03-19 ENCOUNTER — Other Ambulatory Visit: Payer: Self-pay | Admitting: Cardiology

## 2015-04-12 ENCOUNTER — Ambulatory Visit (INDEPENDENT_AMBULATORY_CARE_PROVIDER_SITE_OTHER): Payer: Medicare HMO | Admitting: Pharmacist Clinician (PhC)/ Clinical Pharmacy Specialist

## 2015-04-12 DIAGNOSIS — I481 Persistent atrial fibrillation: Secondary | ICD-10-CM | POA: Diagnosis not present

## 2015-04-12 DIAGNOSIS — Z7901 Long term (current) use of anticoagulants: Secondary | ICD-10-CM | POA: Diagnosis not present

## 2015-04-12 DIAGNOSIS — I4891 Unspecified atrial fibrillation: Secondary | ICD-10-CM

## 2015-04-12 DIAGNOSIS — I4819 Other persistent atrial fibrillation: Secondary | ICD-10-CM

## 2015-04-12 LAB — POCT INR: INR: 2.6

## 2015-05-10 ENCOUNTER — Ambulatory Visit (INDEPENDENT_AMBULATORY_CARE_PROVIDER_SITE_OTHER): Payer: Medicare HMO | Admitting: Pharmacist Clinician (PhC)/ Clinical Pharmacy Specialist

## 2015-05-10 DIAGNOSIS — Z7901 Long term (current) use of anticoagulants: Secondary | ICD-10-CM

## 2015-05-10 DIAGNOSIS — I481 Persistent atrial fibrillation: Secondary | ICD-10-CM

## 2015-05-10 DIAGNOSIS — I4819 Other persistent atrial fibrillation: Secondary | ICD-10-CM

## 2015-05-10 DIAGNOSIS — I4891 Unspecified atrial fibrillation: Secondary | ICD-10-CM

## 2015-05-10 LAB — POCT INR: INR: 3.2

## 2015-05-31 ENCOUNTER — Ambulatory Visit (INDEPENDENT_AMBULATORY_CARE_PROVIDER_SITE_OTHER): Payer: Medicare HMO | Admitting: Pharmacist Clinician (PhC)/ Clinical Pharmacy Specialist

## 2015-05-31 DIAGNOSIS — I481 Persistent atrial fibrillation: Secondary | ICD-10-CM | POA: Diagnosis not present

## 2015-05-31 DIAGNOSIS — I4819 Other persistent atrial fibrillation: Secondary | ICD-10-CM

## 2015-05-31 DIAGNOSIS — Z7901 Long term (current) use of anticoagulants: Secondary | ICD-10-CM | POA: Diagnosis not present

## 2015-05-31 DIAGNOSIS — I4891 Unspecified atrial fibrillation: Secondary | ICD-10-CM

## 2015-05-31 LAB — POCT INR: INR: 3.8

## 2015-06-11 ENCOUNTER — Other Ambulatory Visit: Payer: Self-pay | Admitting: Cardiology

## 2015-06-14 ENCOUNTER — Ambulatory Visit (INDEPENDENT_AMBULATORY_CARE_PROVIDER_SITE_OTHER): Payer: Medicare HMO | Admitting: Pharmacist

## 2015-06-14 DIAGNOSIS — Z7901 Long term (current) use of anticoagulants: Secondary | ICD-10-CM

## 2015-06-14 DIAGNOSIS — I481 Persistent atrial fibrillation: Secondary | ICD-10-CM | POA: Diagnosis not present

## 2015-06-14 DIAGNOSIS — I4819 Other persistent atrial fibrillation: Secondary | ICD-10-CM

## 2015-06-14 DIAGNOSIS — I4891 Unspecified atrial fibrillation: Secondary | ICD-10-CM

## 2015-06-14 LAB — POCT INR: INR: 2.1

## 2015-06-29 ENCOUNTER — Emergency Department (HOSPITAL_COMMUNITY): Payer: Medicare HMO

## 2015-06-29 ENCOUNTER — Emergency Department (HOSPITAL_COMMUNITY)
Admission: EM | Admit: 2015-06-29 | Discharge: 2015-06-30 | Disposition: A | Discharge status: Home / Self Care | Payer: Medicare HMO | Attending: Emergency Medicine | Admitting: Emergency Medicine

## 2015-06-29 ENCOUNTER — Encounter (HOSPITAL_COMMUNITY): Payer: Self-pay

## 2015-06-29 DIAGNOSIS — Z96651 Presence of right artificial knee joint: Secondary | ICD-10-CM | POA: Diagnosis not present

## 2015-06-29 DIAGNOSIS — W19XXXA Unspecified fall, initial encounter: Secondary | ICD-10-CM | POA: Insufficient documentation

## 2015-06-29 DIAGNOSIS — Z7982 Long term (current) use of aspirin: Secondary | ICD-10-CM | POA: Diagnosis not present

## 2015-06-29 DIAGNOSIS — Y998 Other external cause status: Secondary | ICD-10-CM | POA: Insufficient documentation

## 2015-06-29 DIAGNOSIS — Y9289 Other specified places as the place of occurrence of the external cause: Secondary | ICD-10-CM | POA: Diagnosis not present

## 2015-06-29 DIAGNOSIS — Y9301 Activity, walking, marching and hiking: Secondary | ICD-10-CM | POA: Diagnosis not present

## 2015-06-29 DIAGNOSIS — F17210 Nicotine dependence, cigarettes, uncomplicated: Secondary | ICD-10-CM | POA: Diagnosis not present

## 2015-06-29 DIAGNOSIS — Z8673 Personal history of transient ischemic attack (TIA), and cerebral infarction without residual deficits: Secondary | ICD-10-CM | POA: Insufficient documentation

## 2015-06-29 DIAGNOSIS — Z951 Presence of aortocoronary bypass graft: Secondary | ICD-10-CM | POA: Insufficient documentation

## 2015-06-29 DIAGNOSIS — I509 Heart failure, unspecified: Secondary | ICD-10-CM | POA: Diagnosis not present

## 2015-06-29 DIAGNOSIS — R531 Weakness: Secondary | ICD-10-CM | POA: Diagnosis present

## 2015-06-29 DIAGNOSIS — Z7901 Long term (current) use of anticoagulants: Secondary | ICD-10-CM | POA: Diagnosis not present

## 2015-06-29 DIAGNOSIS — I110 Hypertensive heart disease with heart failure: Secondary | ICD-10-CM | POA: Diagnosis not present

## 2015-06-29 DIAGNOSIS — I2510 Atherosclerotic heart disease of native coronary artery without angina pectoris: Secondary | ICD-10-CM | POA: Diagnosis not present

## 2015-06-29 LAB — CBC WITH DIFFERENTIAL/PLATELET
BASOS PCT: 0 %
Basophils Absolute: 0 10*3/uL (ref 0.0–0.1)
EOS ABS: 0.1 10*3/uL (ref 0.0–0.7)
Eosinophils Relative: 4 %
HCT: 35.6 % — ABNORMAL LOW (ref 39.0–52.0)
HEMOGLOBIN: 12.7 g/dL — AB (ref 13.0–17.0)
Lymphocytes Relative: 22 %
Lymphs Abs: 0.8 10*3/uL (ref 0.7–4.0)
MCH: 35.1 pg — ABNORMAL HIGH (ref 26.0–34.0)
MCHC: 35.7 g/dL (ref 30.0–36.0)
MCV: 98.3 fL (ref 78.0–100.0)
MONO ABS: 0.4 10*3/uL (ref 0.1–1.0)
MONOS PCT: 12 %
NEUTROS PCT: 62 %
Neutro Abs: 2.3 10*3/uL (ref 1.7–7.7)
Platelets: 123 10*3/uL — ABNORMAL LOW (ref 150–400)
RBC: 3.62 MIL/uL — ABNORMAL LOW (ref 4.22–5.81)
RDW: 14 % (ref 11.5–15.5)
WBC: 3.7 10*3/uL — ABNORMAL LOW (ref 4.0–10.5)

## 2015-06-29 LAB — BASIC METABOLIC PANEL
Anion gap: 8 (ref 5–15)
BUN: 13 mg/dL (ref 6–20)
CALCIUM: 9.6 mg/dL (ref 8.9–10.3)
CO2: 25 mmol/L (ref 22–32)
CREATININE: 0.99 mg/dL (ref 0.61–1.24)
Chloride: 100 mmol/L — ABNORMAL LOW (ref 101–111)
GFR calc non Af Amer: >60 mL/min (ref >60)
Glucose, Bld: 80 mg/dL (ref 65–99)
Potassium: 4.1 mmol/L (ref 3.5–5.1)
SODIUM: 133 mmol/L — AB (ref 135–145)

## 2015-06-29 LAB — PROTIME-INR
INR: 2.59 — AB (ref 0.00–1.49)
PROTHROMBIN TIME: 27.4 s — AB (ref 11.6–15.2)

## 2015-06-29 NOTE — ED Provider Notes (Signed)
CSN: [id number]     Arrival date & time 06/29/15  2032 History   First MD Initiated Contact with Patient 06/29/15 2045     Chief Complaint  Patient presents with  . Extremity Weakness  . Fall     (Consider location/radiation/quality/duration/timing/severity/associated sxs/prior Treatment) Patient is a 82 y.o. male presenting with general illness. The history is provided by the patient and the spouse.  Illness Location:  Mechanical fall Severity:  Mild Onset quality:  Sudden Duration:  1 hour Timing:  Constant Progression:  Unchanged Chronicity:  New Context:  Patient reports he has a history of a weak right knee after having a knee replacement. Today he was walking outside, mechanical fall. No prodromal symptoms. Attempted to stand. Had another mechanical fall. No prodromal symptoms again. Denies headache or nausea. Denies any pain at all. No open wounds. Associated symptoms: no abdominal pain, no chest pain, no cough, no diarrhea, no fever, no headaches, no nausea, no shortness of breath and no vomiting     Past Medical History  Diagnosis Date  . Atrial fibrillation [facility])     s/p cardioversion October 2012  . Hypertension   . AAA (abdominal aortic aneurysm) ([place])     s/p stent graft in September 2009 & with attempted embolization/occlusion of vessels for a type 2 leak around the stent graft; Managed by Dr. Oneida Alar; last CT in 2012 showing the leak had closed.   [name] History of carotid stenosis     s/p L CEA in September 2006  . Gout   . Arthritis   . Hyperlipidemia   . Wears glasses   . Coronary artery disease     Remote CABG x 5 in 2000  . Aortic stenosis     s/p AVR in September 2012 per Dr. Cyndia Bent  . PVC's (premature ventricular contractions)   . PAT (paroxysmal atrial tachycardia) ([place])   . Inguinal hernia     RIH  . Emphysema of lung ([place])   . High risk medication use     on amiodarone  . Chronic anticoagulation   . Gallstones     s/p cholecystectomy in Dec 2012   . Stroke [facility]) 2006    three strokes -no residuals  . Urinary frequency     at night  . Anemia     on iron pills  . CHF (congestive heart failure) ([place])     SEPT AND OCT 2012; follow up echo in October shows EF of 50%.    Past Surgical History  Procedure Laterality Date  . Tonsillectomy    . Joint replacement      Right TKR  . Abdominal aortic aneurysm repair  2008    Gore Excluder Stent Graft repair  . Carotid endarterectomy  2006    Left Side  . Hemorrhoid surgery    . Replacement total knee  2008  . Abdominal aortic aneurysm repair  2010/2012  . Cardiac catheterization  04/29/1998    EF 60%  . US echocardiography  08/18/2009    EF 55-60%  . US echocardiography  04/22/2007    EF 55-60%  . Cardiovascular stress test  08/19/2009  . Aortic valve replacement  10/24/10    AVR  #25 mm Magna Ease pericardial valve  . Hernia repair  10/13/10    RIH  . Colon surgery      lap right colon  . Hemorrhoid surgery    . Coronary artery bypass graft  1970    bypass and open heart  surgery--pt states this is incorrect--his only cabg was in 2000  . Coronary artery bypass graft  2000    5 vessel BY DR.BARTEL. LIMA GRAFT TO THE LAD, SEQUENTIAL SAPHENOUS VEIN GRAFT TO THE  FIRST DIAGONAL AND FIRST OBTUSE MARGINAL VESSELS, SAPHENOUS VEIN GRAFT TO THE SECOND OBTUSE MARGINAL VESSEL, AND SAPHENOUS VEIN GRAFT TO THE PDA  . Eye surgery      bilateral cataract extractions  . Cholecystectomy  01/23/2011    Procedure: LAPAROSCOPIC CHOLECYSTECTOMY WITH INTRAOPERATIVE CHOLANGIOGRAM;  Surgeon: Rolm Bookbinder, MD;  Location: [facility] ORS;  Service: General;  Laterality: N/A;  . Abdominal aortagram N/A 12/25/2011    Procedure: ABDOMINAL Maxcine Ham;  Surgeon: Elam Dutch, MD;  Location: [facility] CATH LAB;  Service: Cardiovascular;  Laterality: N/A;  . Cardioversion N/A 11/11/2014    Procedure: CARDIOVERSION;  Surgeon: [name], MD;  Location: [facility] ENDOSCOPY;  Service: Cardiovascular;  Laterality: N/A;   Family  History  Problem Relation Age of Onset  . Other Mother     falopian tube during pregnancy   . Heart disease Father   . Cancer Brother     liver    Social History  Substance Use Topics  . Smoking status: Current Every Day Smoker    Types: Cigars  . Smokeless tobacco: Never Used     Comment: pt states he smokes 3-4 cigars every day  . Alcohol Use: 12.6 oz/week    21 Cans of beer per week    Review of Systems  Constitutional: Negative for fever and chills.  Respiratory: Negative for cough and shortness of breath.   Cardiovascular: Negative for chest pain.  Gastrointestinal: Negative for nausea, vomiting, abdominal pain and diarrhea.  Musculoskeletal: Negative for back pain and neck pain.  Skin: Negative for wound.  Neurological: Negative for syncope, facial asymmetry, speech difficulty, weakness, light-headedness, numbness and headaches.  All other systems reviewed and are negative.     Allergies  Review of patient's allergies indicates no known allergies.  Home Medications   Prior to Admission medications   Medication Sig Start Date End Date Taking? Authorizing Provider  aspirin EC 81 MG tablet Take 81 mg by mouth daily.   Yes Historical Provider, MD  carvedilol (COREG) 6.25 MG tablet Take 0.5 tablets (3.125 mg total) by mouth 2 (two) times daily with a meal. 10/12/14  Yes Brittainy Erie Noe, PA-C  clotrimazole ([facility] RELIEF) 1 % cream Apply 1 application topically as needed.   Yes Historical Provider, MD  cyanocobalamin (,VITAMIN B-12,) 1000 MCG/ML injection Inject 1,000 mcg into the muscle every 30 (thirty) days.   Yes Historical Provider, MD  dorzolamide-timolol (COSOPT) 22.3-6.8 MG/ML ophthalmic solution 1 drop 2 (two) times daily.   Yes Historical Provider, MD  doxylamine, Sleep, (UNISOM) 25 MG tablet Take 25 mg by mouth at bedtime.   Yes Historical Provider, MD  furosemide (LASIX) 20 MG tablet Take 1 tablet (20 mg total) by mouth daily as needed for edema (for edema  and shortness of breath). 09/11/14  Yes Jessica U Vann, DO  latanoprost (XALATAN) 0.005 % ophthalmic solution Place 1 drop into both eyes at bedtime.   Yes Historical Provider, MD  levothyroxine (SYNTHROID, LEVOTHROID) 25 MCG tablet TAKE ONE TABLET BY MOUTH ONCE DAILY BEFORE BREAKFAST 03/19/15  Yes Peter M Martinique, MD  Multiple Vitamin (MULTIVITAMIN) tablet Take 1 tablet by mouth 2 (two) times daily.    Yes Historical Provider, MD  Naproxen Sodium (ALEVE) 220 MG CAPS Take 1 capsule by mouth as needed.  Yes Historical Provider, MD  Oxymetazoline HCl (NASAL SPRAY) 0.05 % SOLN Place into the nose as needed.   Yes Historical Provider, MD  warfarin (COUMADIN) 7.5 MG tablet TAKE ONE TO ONE & ONE-HALF TABLETS BY MOUTH ONCE DAILY AS DIRECTED BY  COUMADIN  CLINIC 06/11/15  Yes Peter M Martinique, MD  ramipril (ALTACE) 5 MG capsule TAKE ONE CAPSULE BY MOUTH ONCE DAILY 03/19/15   Peter M Martinique, MD   BP 180/73 mmHg  Pulse 36  Temp(Src) 98.4 F (36.9 C) (Oral)  Resp 18  SpO2 98% Physical Exam  Constitutional: He is oriented to person, place, and time. He appears well-developed and well-nourished. No distress.  HENT:  Head: Normocephalic and atraumatic.  Eyes: EOM are normal. Pupils are equal, round, and reactive to light.  Neck: No spinous process tenderness present.  Cardiovascular: Normal rate, regular rhythm and intact distal pulses.   Pulmonary/Chest: Effort normal. No respiratory distress. He exhibits no tenderness.  Status post open heart surgery.  Abdominal: Soft. There is no tenderness. There is no guarding.  Musculoskeletal: He exhibits no edema.       Cervical back: He exhibits no tenderness.       Thoracic back: He exhibits no tenderness.       Lumbar back: He exhibits no tenderness.  Neurological: He is alert and oriented to person, place, and time. He has normal strength and normal reflexes. No cranial nerve deficit or sensory deficit. He displays a negative Romberg sign. Coordination normal. GCS  eye subscore is 4. GCS verbal subscore is 5. GCS motor subscore is 6.  Skin: Skin is warm and dry.    ED Course  Procedures (including critical care time) Labs Review Labs Reviewed  CBC WITH DIFFERENTIAL/PLATELET - Abnormal; Notable for the following:    WBC 3.7 (*)    RBC 3.62 (*)    Hemoglobin 12.7 (*)    HCT 35.6 (*)    MCH 35.1 (*)    Platelets 123 (*)    All other components within normal limits  BASIC METABOLIC PANEL - Abnormal; Notable for the following:    Sodium 133 (*)    Chloride 100 (*)    All other components within normal limits  PROTIME-INR - Abnormal; Notable for the following:    Prothrombin Time 27.4 (*)    INR 2.59 (*)    All other components within normal limits    Imaging Review Ct Head Wo Contrast  06/29/2015  CLINICAL DATA:  Patient fell.  History of multiple strokes. EXAM: CT HEAD WITHOUT CONTRAST CT CERVICAL SPINE WITHOUT CONTRAST TECHNIQUE: Multidetector CT imaging of the head and cervical spine was performed following the standard protocol without intravenous contrast. Multiplanar CT image reconstructions of the cervical spine were also generated. COMPARISON:  CT head 11/01/2004.  MRI brain 10/28/2004. FINDINGS: CT HEAD FINDINGS Diffuse cerebral atrophy. Ventricular dilatation consistent with central atrophy. Low-attenuation changes throughout the deep white matter consistent small vessel ischemia. Encephalomalacia in the anterior frontal lobes bilaterally consistent with old infarcts. There appears to been some progression on the right since the prior study. No mass effect or midline shift. No abnormal extra-axial fluid collections. Gray-white matter junctions are distinct. Basal cisterns are not effaced. No evidence of acute intracranial hemorrhage. No depressed skull fractures. Visualized paranasal sinuses and mastoid air cells are not opacified. Heterogeneous vague sclerotic pattern to the bones suggesting metabolic bone disease. No focal lesion or bone  destruction. CT CERVICAL SPINE FINDINGS Slight anterior subluxation at C2-3 and C7-T1.  These changes are likely degenerative but ligamentous injury is not entirely excluded. Patient is symptomatic to these levels, consider MRI for further evaluation. Diffuse degenerative changes are present throughout the cervical spine with narrowed cervical interspaces and associated endplate hypertrophic changes. Degenerative changes throughout the cervical facet joints. Prominent disc osteophyte complex protrudes upon the central canal at C5-6. Facet joint and uncovertebral spurring causes neural foraminal narrowing bilaterally at multiple levels. Normal alignment of the facet joints and posterior elements. No vertebral compression deformities. No prevertebral soft tissue swelling. No focal bone lesion or bone destruction. C1-2 articulation appears intact. Soft tissues are unremarkable. Vascular calcifications. Postoperative changes in the sternum. IMPRESSION: No acute intracranial abnormalities. Chronic atrophy and small vessel ischemic changes. Vague bone sclerosis suggesting metabolic bone disease. Diffuse degenerative change throughout the cervical spine. Slight anterior subluxations of C2 on C3 and C7 on T1, likely degenerative. Ligamentous injury not entirely excluded. No acute displaced fractures identified. Electronically Signed   By: Lucienne Capers M.D.   On: 06/29/2015 21:51   Ct Cervical Spine Wo Contrast  06/29/2015  CLINICAL DATA:  Patient fell.  History of multiple strokes. EXAM: CT HEAD WITHOUT CONTRAST CT CERVICAL SPINE WITHOUT CONTRAST TECHNIQUE: Multidetector CT imaging of the head and cervical spine was performed following the standard protocol without intravenous contrast. Multiplanar CT image reconstructions of the cervical spine were also generated. COMPARISON:  CT head 11/01/2004.  MRI brain 10/28/2004. FINDINGS: CT HEAD FINDINGS Diffuse cerebral atrophy. Ventricular dilatation consistent with central  atrophy. Low-attenuation changes throughout the deep white matter consistent small vessel ischemia. Encephalomalacia in the anterior frontal lobes bilaterally consistent with old infarcts. There appears to been some progression on the right since the prior study. No mass effect or midline shift. No abnormal extra-axial fluid collections. Gray-white matter junctions are distinct. Basal cisterns are not effaced. No evidence of acute intracranial hemorrhage. No depressed skull fractures. Visualized paranasal sinuses and mastoid air cells are not opacified. Heterogeneous vague sclerotic pattern to the bones suggesting metabolic bone disease. No focal lesion or bone destruction. CT CERVICAL SPINE FINDINGS Slight anterior subluxation at C2-3 and C7-T1. These changes are likely degenerative but ligamentous injury is not entirely excluded. Patient is symptomatic to these levels, consider MRI for further evaluation. Diffuse degenerative changes are present throughout the cervical spine with narrowed cervical interspaces and associated endplate hypertrophic changes. Degenerative changes throughout the cervical facet joints. Prominent disc osteophyte complex protrudes upon the central canal at C5-6. Facet joint and uncovertebral spurring causes neural foraminal narrowing bilaterally at multiple levels. Normal alignment of the facet joints and posterior elements. No vertebral compression deformities. No prevertebral soft tissue swelling. No focal bone lesion or bone destruction. C1-2 articulation appears intact. Soft tissues are unremarkable. Vascular calcifications. Postoperative changes in the sternum. IMPRESSION: No acute intracranial abnormalities. Chronic atrophy and small vessel ischemic changes. Vague bone sclerosis suggesting metabolic bone disease. Diffuse degenerative change throughout the cervical spine. Slight anterior subluxations of C2 on C3 and C7 on T1, likely degenerative. Ligamentous injury not entirely  excluded. No acute displaced fractures identified. Electronically Signed   By: Lucienne Capers M.D.   On: 06/29/2015 21:51   I have personally reviewed and evaluated these images and lab results as part of my medical decision-making.   EKG Interpretation   Date/Time:  Tuesday Jun 29 2015 20:39:18 EDT Ventricular Rate:  59 PR Interval:  162 QRS Duration: 157 QT Interval:  471 QTC Calculation: 467 R Axis:   -9 Text Interpretation:  Atrial fibrillation Supraventricular  bigeminy IVCD,  consider atypical LBBB Abnormal ekg Confirmed by Christy Gentles  MD, Elenore Rota  [phone]) on 06/29/2015 8:53:04 PM      MDM   Final diagnoses:  Fall, initial encounter    82 year old male with a past medical history of right sided knee replacement presenting after a mechanical fall. Patient denies any prodromal symptoms. EKG repeated here without new interval abnormalities or ischemic changes. No chest pain or shortness of breath. Doubt ACS. No headache or vision changes. No focal neurological deficits on exam. Doubt intracranial abnormality. CT head without evidence of acute bleed or fracture. CT cervical spine without evidence of vertebral injury. Labs within normal limits. No evidence of any other external trauma on physical exam.Patient able to be ambulated throughout the emergency department without issue.   Strict return precautions provided. Encouraged him to follow up with his primary care doctor in next couple days for reevaluation. Patient discharged in stable condition.  Maryan Puls, MD 06/30/15 [id number]  Ripley Fraise, MD 07/01/15 [phone]

## 2015-06-29 NOTE — ED Notes (Signed)
Pt given sandwich 

## 2015-06-29 NOTE — ED Notes (Signed)
Pt presents to er with ems from home, the patient fell twice today within the last hour, states he had two beers like usual and smoked a cigar and was outside enjoying the day when he fell on the porch and landed on his bottom, denies hitting his head, patient denies any complaints and is alert and oriented, per ems he just seemed off to the family, he feels weak in his lower extremities

## 2015-06-30 NOTE — ED Notes (Signed)
Pt ambulated with walker with steady gait

## 2015-07-05 ENCOUNTER — Telehealth: Payer: Self-pay | Admitting: Pharmacist

## 2015-07-05 ENCOUNTER — Ambulatory Visit (INDEPENDENT_AMBULATORY_CARE_PROVIDER_SITE_OTHER): Payer: Medicare HMO | Admitting: Pharmacist

## 2015-07-05 VITALS — BP 172/72

## 2015-07-05 DIAGNOSIS — I4819 Other persistent atrial fibrillation: Secondary | ICD-10-CM

## 2015-07-05 DIAGNOSIS — I4891 Unspecified atrial fibrillation: Secondary | ICD-10-CM | POA: Diagnosis not present

## 2015-07-05 DIAGNOSIS — Z7901 Long term (current) use of anticoagulants: Secondary | ICD-10-CM

## 2015-07-05 DIAGNOSIS — I481 Persistent atrial fibrillation: Secondary | ICD-10-CM | POA: Diagnosis not present

## 2015-07-05 LAB — POCT INR: INR: 3

## 2015-07-05 MED ORDER — LOSARTAN POTASSIUM 25 MG PO TABS: 25.0000 mg | ORAL_TABLET | Freq: Every day | ORAL | Status: DC

## 2015-07-05 NOTE — Telephone Encounter (Signed)
Patient here for INR check and wife reported that ramipril was discontinued by PCP due to long standing cough. Patient is unsure if this discontinuation has improved cough, but does state that his blood pressures was elevated at another doctor visit last week. I checked his pressure and it was above goal today at 172/72. Since no new medication has been started to replace the ramipril will start losartan 25mg  daily (since this medication is less associated with cough) and recheck BP at next Coumadin visit. Also instructed him to call with any questions/concern in the meantime.

## 2015-08-02 ENCOUNTER — Ambulatory Visit (INDEPENDENT_AMBULATORY_CARE_PROVIDER_SITE_OTHER): Payer: Medicare HMO | Admitting: Pharmacist

## 2015-08-02 DIAGNOSIS — I4819 Other persistent atrial fibrillation: Secondary | ICD-10-CM

## 2015-08-02 DIAGNOSIS — I481 Persistent atrial fibrillation: Secondary | ICD-10-CM | POA: Diagnosis not present

## 2015-08-02 DIAGNOSIS — Z7901 Long term (current) use of anticoagulants: Secondary | ICD-10-CM | POA: Diagnosis not present

## 2015-08-02 DIAGNOSIS — I4891 Unspecified atrial fibrillation: Secondary | ICD-10-CM | POA: Diagnosis not present

## 2015-08-02 LAB — POCT INR: INR: 2.5

## 2015-09-13 ENCOUNTER — Ambulatory Visit (INDEPENDENT_AMBULATORY_CARE_PROVIDER_SITE_OTHER): Payer: Medicare HMO | Admitting: Pharmacist

## 2015-09-13 DIAGNOSIS — I481 Persistent atrial fibrillation: Secondary | ICD-10-CM | POA: Diagnosis not present

## 2015-09-13 DIAGNOSIS — I4819 Other persistent atrial fibrillation: Secondary | ICD-10-CM

## 2015-09-13 DIAGNOSIS — I4891 Unspecified atrial fibrillation: Secondary | ICD-10-CM

## 2015-09-13 DIAGNOSIS — Z7901 Long term (current) use of anticoagulants: Secondary | ICD-10-CM | POA: Diagnosis not present

## 2015-09-13 LAB — POCT INR: INR: 2.5

## 2015-09-15 ENCOUNTER — Other Ambulatory Visit: Payer: Self-pay | Admitting: Cardiology

## 2015-09-15 NOTE — Telephone Encounter (Signed)
Rx request sent to pharmacy.  

## 2015-10-01 ENCOUNTER — Other Ambulatory Visit: Payer: Self-pay | Admitting: Cardiology

## 2015-10-23 ENCOUNTER — Other Ambulatory Visit: Payer: Self-pay | Admitting: Cardiology

## 2015-10-25 NOTE — Telephone Encounter (Signed)
Rx(s) sent to pharmacy electronically.  

## 2015-10-27 ENCOUNTER — Ambulatory Visit (INDEPENDENT_AMBULATORY_CARE_PROVIDER_SITE_OTHER): Payer: Medicare HMO | Admitting: Pharmacist

## 2015-10-27 DIAGNOSIS — Z7901 Long term (current) use of anticoagulants: Secondary | ICD-10-CM | POA: Diagnosis not present

## 2015-10-27 DIAGNOSIS — I4891 Unspecified atrial fibrillation: Secondary | ICD-10-CM

## 2015-10-27 DIAGNOSIS — I481 Persistent atrial fibrillation: Secondary | ICD-10-CM | POA: Diagnosis not present

## 2015-10-27 DIAGNOSIS — I4819 Other persistent atrial fibrillation: Secondary | ICD-10-CM

## 2015-10-27 LAB — POCT INR: INR: 2.7

## 2015-10-27 MED ORDER — FUROSEMIDE 20 MG PO TABS: 20.0000 mg | ORAL_TABLET | Freq: Every day | ORAL | 1 refills | Status: DC | PRN

## 2015-12-08 ENCOUNTER — Ambulatory Visit (INDEPENDENT_AMBULATORY_CARE_PROVIDER_SITE_OTHER): Payer: Medicare HMO | Admitting: Pharmacist

## 2015-12-08 DIAGNOSIS — Z7901 Long term (current) use of anticoagulants: Secondary | ICD-10-CM | POA: Diagnosis not present

## 2015-12-08 DIAGNOSIS — I4891 Unspecified atrial fibrillation: Secondary | ICD-10-CM | POA: Diagnosis not present

## 2015-12-08 LAB — POCT INR: INR: 2.5

## 2015-12-12 ENCOUNTER — Other Ambulatory Visit: Payer: Self-pay | Admitting: Cardiology

## 2015-12-12 DIAGNOSIS — I10 Essential (primary) hypertension: Secondary | ICD-10-CM

## 2015-12-12 DIAGNOSIS — I5022 Chronic systolic (congestive) heart failure: Secondary | ICD-10-CM

## 2015-12-12 DIAGNOSIS — E785 Hyperlipidemia, unspecified: Secondary | ICD-10-CM

## 2015-12-12 DIAGNOSIS — E039 Hypothyroidism, unspecified: Secondary | ICD-10-CM

## 2015-12-13 ENCOUNTER — Other Ambulatory Visit: Payer: Self-pay

## 2015-12-13 DIAGNOSIS — E785 Hyperlipidemia, unspecified: Secondary | ICD-10-CM

## 2015-12-20 ENCOUNTER — Other Ambulatory Visit: Payer: Medicare HMO

## 2015-12-24 ENCOUNTER — Other Ambulatory Visit: Payer: Self-pay | Admitting: Cardiology

## 2015-12-30 ENCOUNTER — Telehealth: Payer: Self-pay | Admitting: Cardiology

## 2015-12-30 NOTE — Telephone Encounter (Signed)
Mrs. Jozwiak states her husband takes furosemide 20 mg as needed for swelling.  For the past couple of week he has had swelling in his feet and ankles.  Can she give him 40 mg instead of 20 mg until the swelling comes down?

## 2015-12-30 NOTE — Telephone Encounter (Signed)
Would recommend he take furosemide 40 mg qd x 3 days total (was he given 40 mg today) then resume with 20 mg.  If swelling continues will need to refer to MD.  Encourage an increase in potassium containing foods for the next 3 days as well

## 2015-12-30 NOTE — Telephone Encounter (Signed)
Spoke with wife she states that pt has had increased swelling for 2 weeks and pt takes lasix 20mg  PRN and fhe has been taking it every day for 2 weeks with little effect. Swelling is in bilateral ankles and feet (the right foot is the worst) pt has bad knees and has issues walking around and daily SOB so wife states that it is no more than usual.denies any chest pain or pressure, increased SOB. I informed wife to give additional lasix and call back this afternoon with any result. Please advise

## 2015-12-30 NOTE — Telephone Encounter (Signed)
Wife calls back and reports that he seems to have experienced some relief and he seems to be doing better. She reports that she is not sure if he is peeing enough. Adivsed his weights are more of an indicator. Instructed to do two more days of increased dose of lasix and increase potassium containing food. She is to call if he continues to have issues or declines further as he will need to be seen by MD. She states she understands and appreciates call.

## 2016-01-03 ENCOUNTER — Encounter (HOSPITAL_COMMUNITY): Payer: Self-pay

## 2016-01-03 ENCOUNTER — Emergency Department (HOSPITAL_COMMUNITY): Payer: Medicare HMO

## 2016-01-03 ENCOUNTER — Inpatient Hospital Stay (HOSPITAL_COMMUNITY)
Admission: EM | Admit: 2016-01-03 | Discharge: 2016-01-11 | DRG: 378 | Disposition: A | Discharge status: Home / Self Care | Payer: Medicare HMO | Attending: Oncology | Admitting: Oncology

## 2016-01-03 DIAGNOSIS — I2510 Atherosclerotic heart disease of native coronary artery without angina pectoris: Secondary | ICD-10-CM | POA: Diagnosis present

## 2016-01-03 DIAGNOSIS — Z8673 Personal history of transient ischemic attack (TIA), and cerebral infarction without residual deficits: Secondary | ICD-10-CM

## 2016-01-03 DIAGNOSIS — Z7901 Long term (current) use of anticoagulants: Secondary | ICD-10-CM

## 2016-01-03 DIAGNOSIS — R350 Frequency of micturition: Secondary | ICD-10-CM | POA: Diagnosis present

## 2016-01-03 DIAGNOSIS — T502X5A Adverse effect of carbonic-anhydrase inhibitors, benzothiadiazides and other diuretics, initial encounter: Secondary | ICD-10-CM | POA: Diagnosis present

## 2016-01-03 DIAGNOSIS — E039 Hypothyroidism, unspecified: Secondary | ICD-10-CM | POA: Diagnosis present

## 2016-01-03 DIAGNOSIS — R011 Cardiac murmur, unspecified: Secondary | ICD-10-CM | POA: Diagnosis present

## 2016-01-03 DIAGNOSIS — N182 Chronic kidney disease, stage 2 (mild): Secondary | ICD-10-CM | POA: Diagnosis present

## 2016-01-03 DIAGNOSIS — M1711 Unilateral primary osteoarthritis, right knee: Secondary | ICD-10-CM | POA: Diagnosis present

## 2016-01-03 DIAGNOSIS — I482 Chronic atrial fibrillation, unspecified: Secondary | ICD-10-CM | POA: Diagnosis present

## 2016-01-03 DIAGNOSIS — K2971 Gastritis, unspecified, with bleeding: Secondary | ICD-10-CM | POA: Diagnosis present

## 2016-01-03 DIAGNOSIS — M109 Gout, unspecified: Secondary | ICD-10-CM

## 2016-01-03 DIAGNOSIS — E785 Hyperlipidemia, unspecified: Secondary | ICD-10-CM | POA: Diagnosis present

## 2016-01-03 DIAGNOSIS — K449 Diaphragmatic hernia without obstruction or gangrene: Secondary | ICD-10-CM | POA: Diagnosis present

## 2016-01-03 DIAGNOSIS — K254 Chronic or unspecified gastric ulcer with hemorrhage: Secondary | ICD-10-CM | POA: Diagnosis not present

## 2016-01-03 DIAGNOSIS — J439 Emphysema, unspecified: Secondary | ICD-10-CM | POA: Diagnosis present

## 2016-01-03 DIAGNOSIS — R001 Bradycardia, unspecified: Secondary | ICD-10-CM | POA: Diagnosis present

## 2016-01-03 DIAGNOSIS — Z66 Do not resuscitate: Secondary | ICD-10-CM | POA: Diagnosis present

## 2016-01-03 DIAGNOSIS — D62 Acute posthemorrhagic anemia: Secondary | ICD-10-CM | POA: Diagnosis present

## 2016-01-03 DIAGNOSIS — Z953 Presence of xenogenic heart valve: Secondary | ICD-10-CM

## 2016-01-03 DIAGNOSIS — E861 Hypovolemia: Secondary | ICD-10-CM | POA: Diagnosis present

## 2016-01-03 DIAGNOSIS — Z951 Presence of aortocoronary bypass graft: Secondary | ICD-10-CM

## 2016-01-03 DIAGNOSIS — Z952 Presence of prosthetic heart valve: Secondary | ICD-10-CM

## 2016-01-03 DIAGNOSIS — K922 Gastrointestinal hemorrhage, unspecified: Secondary | ICD-10-CM | POA: Diagnosis not present

## 2016-01-03 DIAGNOSIS — I5032 Chronic diastolic (congestive) heart failure: Secondary | ICD-10-CM | POA: Diagnosis present

## 2016-01-03 DIAGNOSIS — Z7982 Long term (current) use of aspirin: Secondary | ICD-10-CM

## 2016-01-03 DIAGNOSIS — T39315A Adverse effect of propionic acid derivatives, initial encounter: Secondary | ICD-10-CM | POA: Diagnosis present

## 2016-01-03 DIAGNOSIS — K5900 Constipation, unspecified: Secondary | ICD-10-CM

## 2016-01-03 DIAGNOSIS — Z8679 Personal history of other diseases of the circulatory system: Secondary | ICD-10-CM

## 2016-01-03 DIAGNOSIS — Z9889 Other specified postprocedural states: Secondary | ICD-10-CM

## 2016-01-03 DIAGNOSIS — F17290 Nicotine dependence, other tobacco product, uncomplicated: Secondary | ICD-10-CM | POA: Diagnosis present

## 2016-01-03 DIAGNOSIS — I350 Nonrheumatic aortic (valve) stenosis: Secondary | ICD-10-CM | POA: Diagnosis present

## 2016-01-03 DIAGNOSIS — D696 Thrombocytopenia, unspecified: Secondary | ICD-10-CM

## 2016-01-03 DIAGNOSIS — I130 Hypertensive heart and chronic kidney disease with heart failure and stage 1 through stage 4 chronic kidney disease, or unspecified chronic kidney disease: Secondary | ICD-10-CM | POA: Diagnosis present

## 2016-01-03 HISTORY — DX: Gastrointestinal hemorrhage, unspecified: K92.2

## 2016-01-03 LAB — PREPARE RBC (CROSSMATCH)

## 2016-01-03 LAB — CBC WITH DIFFERENTIAL/PLATELET
BASOS ABS: 0 10*3/uL (ref 0.0–0.1)
Basophils Relative: 0 %
Eosinophils Absolute: 0 10*3/uL (ref 0.0–0.7)
Eosinophils Relative: 0 %
HCT: 25.9 % — ABNORMAL LOW (ref 39.0–52.0)
Hemoglobin: 8.9 g/dL — ABNORMAL LOW (ref 13.0–17.0)
LYMPHS ABS: 0.8 10*3/uL (ref 0.7–4.0)
Lymphocytes Relative: 9 %
MCH: 35 pg — ABNORMAL HIGH (ref 26.0–34.0)
MCHC: 34.4 g/dL (ref 30.0–36.0)
MCV: 102 fL — AB (ref 78.0–100.0)
MONO ABS: 0.5 10*3/uL (ref 0.1–1.0)
Monocytes Relative: 6 %
NEUTROS ABS: 7.5 10*3/uL (ref 1.7–7.7)
Neutrophils Relative %: 85 %
PLATELETS: 108 10*3/uL — AB (ref 150–400)
RBC: 2.54 MIL/uL — AB (ref 4.22–5.81)
RDW: 14.5 % (ref 11.5–15.5)
WBC: 8.8 10*3/uL (ref 4.0–10.5)

## 2016-01-03 LAB — URINALYSIS, ROUTINE W REFLEX MICROSCOPIC
BILIRUBIN URINE: NEGATIVE
Glucose, UA: NEGATIVE mg/dL
HGB URINE DIPSTICK: NEGATIVE
KETONES UR: NEGATIVE mg/dL
NITRITE: NEGATIVE
PH: 5 (ref 5.0–8.0)
Protein, ur: NEGATIVE mg/dL
Specific Gravity, Urine: 1.018 (ref 1.005–1.030)

## 2016-01-03 LAB — URINE MICROSCOPIC-ADD ON: RBC / HPF: NONE SEEN RBC/hpf (ref 0–5)

## 2016-01-03 LAB — COMPREHENSIVE METABOLIC PANEL
ALK PHOS: 41 U/L (ref 38–126)
ALT: 13 U/L — AB (ref 17–63)
AST: 19 U/L (ref 15–41)
Albumin: 3.4 g/dL — ABNORMAL LOW (ref 3.5–5.0)
Anion gap: 7 (ref 5–15)
BILIRUBIN TOTAL: 0.7 mg/dL (ref 0.3–1.2)
BUN: 83 mg/dL — AB (ref 6–20)
CO2: 20 mmol/L — ABNORMAL LOW (ref 22–32)
CREATININE: 1.24 mg/dL (ref 0.61–1.24)
Calcium: 9.1 mg/dL (ref 8.9–10.3)
Chloride: 113 mmol/L — ABNORMAL HIGH (ref 101–111)
GFR calc Af Amer: 58 mL/min — ABNORMAL LOW (ref >60)
GFR, EST NON AFRICAN AMERICAN: 50 mL/min — AB (ref >60)
Glucose, Bld: 129 mg/dL — ABNORMAL HIGH (ref 65–99)
Potassium: 4.5 mmol/L (ref 3.5–5.1)
Sodium: 140 mmol/L (ref 135–145)
TOTAL PROTEIN: 5.1 g/dL — AB (ref 6.5–8.1)

## 2016-01-03 LAB — I-STAT CG4 LACTIC ACID, ED: Lactic Acid, Venous: 1.82 mmol/L (ref 0.5–1.9)

## 2016-01-03 LAB — PROTIME-INR
INR: 3.09
PROTHROMBIN TIME: 32.6 s — AB (ref 11.4–15.2)

## 2016-01-03 LAB — TSH: TSH: 3.576 u[IU]/mL (ref 0.350–4.500)

## 2016-01-03 LAB — POC OCCULT BLOOD, ED: Fecal Occult Bld: POSITIVE — AB

## 2016-01-03 MED ORDER — SODIUM CHLORIDE 0.9% FLUSH
3.0000 mL | Freq: Two times a day (BID) | INTRAVENOUS | Status: DC
  Administered 2016-01-03 – 2016-01-11 (×12): 3 mL via INTRAVENOUS

## 2016-01-03 MED ORDER — ACETAMINOPHEN 325 MG PO TABS: 650.0000 mg | ORAL_TABLET | Freq: Four times a day (QID) | ORAL | Status: DC | PRN

## 2016-01-03 MED ORDER — PANTOPRAZOLE SODIUM 40 MG IV SOLR
40.0000 mg | Freq: Once | INTRAVENOUS | Status: AC
  Administered 2016-01-03: 40 mg via INTRAVENOUS
  Filled 2016-01-03: qty 40

## 2016-01-03 MED ORDER — SODIUM CHLORIDE 0.9 % IV SOLN
INTRAVENOUS | Status: DC
  Administered 2016-01-04: 06:00:00 via INTRAVENOUS

## 2016-01-03 MED ORDER — PANTOPRAZOLE SODIUM 40 MG IV SOLR: 40.0000 mg | Freq: Two times a day (BID) | INTRAVENOUS | Status: DC

## 2016-01-03 MED ORDER — SODIUM CHLORIDE 0.9 % IV SOLN
8.0000 mg/h | INTRAVENOUS | Status: DC
  Administered 2016-01-03 – 2016-01-05 (×3): 8 mg/h via INTRAVENOUS
  Filled 2016-01-03 (×9): qty 80

## 2016-01-03 MED ORDER — SODIUM CHLORIDE 0.9 % IV SOLN
Freq: Once | INTRAVENOUS | Status: AC
  Administered 2016-01-03: 10 mL/h via INTRAVENOUS

## 2016-01-03 MED ORDER — LATANOPROST 0.005 % OP SOLN
1.0000 [drp] | Freq: Every day | OPHTHALMIC | Status: DC
  Administered 2016-01-03 – 2016-01-10 (×8): 1 [drp] via OPHTHALMIC
  Filled 2016-01-03: qty 2.5

## 2016-01-03 MED ORDER — LEVOTHYROXINE SODIUM 25 MCG PO TABS
25.0000 ug | ORAL_TABLET | Freq: Every day | ORAL | Status: DC
  Administered 2016-01-04 – 2016-01-11 (×7): 25 ug via ORAL
  Filled 2016-01-03 (×7): qty 1

## 2016-01-03 MED ORDER — DEXTROSE-NACL 5-0.45 % IV SOLN
INTRAVENOUS | Status: AC
  Administered 2016-01-03: 17:00:00 via INTRAVENOUS

## 2016-01-03 MED ORDER — ACETAMINOPHEN 650 MG RE SUPP: 650.0000 mg | Freq: Four times a day (QID) | RECTAL | Status: DC | PRN

## 2016-01-03 NOTE — Progress Notes (Signed)
[name] is a 82 y.o. male patient admitted from ED awake, alert - oriented  X 4 - no acute distress noted.  VSS - Blood pressure (!) 135/54, pulse (!) 58, temperature 98.8 F (37.1 C), temperature source Oral, resp. rate 20, height 5\' 10"  (1.778 m), weight 82.5 kg (181 lb 14.1 oz), SpO2 100 %.    IV in place, occlusive dsg intact without redness.  Orientation to room, and floor completed with information packet given to patient/family.  Patient declined safety video at this time.  Admission INP armband ID verified with patient/family, and in place.   SR up x 2, fall assessment complete, with patient and family able to verbalize understanding of risk associated with falls, and verbalized understanding to call nsg before up out of bed.  Call light within reach, patient able to voice, and demonstrate understanding.  Skin, clean-dry- intact without evidence of skin tears.   No evidence of skin break down noted on exam.   Will cont to eval and treat per MD orders.  Bonnell Public, RN 01/03/2016 6:22 PM

## 2016-01-03 NOTE — ED Notes (Addendum)
EKG given to Dr. Goldston  

## 2016-01-03 NOTE — ED Notes (Signed)
Hospitalist let this RN know that the pt may have liquids.

## 2016-01-03 NOTE — Plan of Care (Signed)
Problem: Education: Goal: Knowledge of [facility] Education information/materials will improve Outcome: Completed/Met Date Met: 01/03/16 Gave patient information about our unit during admission assessment. Patient's wife present, they both verbalize understanding.

## 2016-01-03 NOTE — H&P (Signed)
Date: 01/03/2016               Patient Name:  [name] MRN: [id number]  DOB: 11-21-1926 Age / Sex: 82 y.o., male   PCP: Tamsen Roers, MD              Medical Service: Internal Medicine Teaching Service              Attending Physician: Dr. Annia Belt, MD    First Contact: Sydnee Levans, [place] [facility] Pager: [phone]  Second Contact: Dr. Wynetta Emery Pager: [phone]  Third Contact Dr. Tiburcio Pea Pager: [phone]       After Hours (After 5p/  First Contact Pager: [phone]  weekends / holidays): Second Contact Pager: [phone]   Chief Complaint: "not making it through the door"  History of Present Illness: [name], [name] is a 83 yo M with a significant PMH of atrial fibrillation, currently on warfarin, Aortic Stenosis s/p tissue AVR 10/2010, HFpEF (echo 08/2014), CAD s/p CABG 5 vessel 2000, AAA s/p endograft who presented to the EMS with syncope. The patient states he became very weak when he tried to come back into his house after finishing his beer and cigar on the porch. He was able to open the door but soon slumped over his walker. He denied any missteps. The patient then laid on the floor for about 30 minutes to regain his strength. The patient had to make a bowel movement while laying on the floor, so the patient's wife brought over a commode. She noted his stool was "black like the tv." The patient says he has had black stools for about 3 days now. The rest of the evening was uneventful. The following morning he became weak on the toilet when the he decided to call EMS. When EMS arrived, they picked up the patient off the toilet causing him to synopsize. He had pressures in the 70s, HRs in the 30s and became unresponsive. The patient responded to fluids and was brought to the ED.  The patient's wife added that for the past couple of days he was not "his perky self." The patient denies any overt chest pain or pressures but does admit to becoming nauseous and sweaty while feeling weak. The patient  denies any fevers, chills, abdominal pain, vomiting, or changes in bowel habits. He proudly states he his a regular guy when it comes to bowel movements. The patient agrees to increases in his voiding habits but attributes it to his increase in furosemide. The patient denies any other recent changes to his medications. He admits to using Aleve for right knee arthritis nightly.   In the Ed, the patient was hemodynamically stable and hypothermic. He was noted to have an acute change in his hemoglobin and an elevated BUN. Fecal occult was positive.[name] He was given a baer blanket, IV protonix, and IV fluids. GI was consulted and the patient was admitted to the medicine teaching service.   Meds: Current Facility-Administered Medications  Medication Dose Route Frequency Provider Last Rate Last Dose  . acetaminophen (TYLENOL) tablet 650 mg  650 mg Oral Q6H PRN Milagros Loll, MD       Or  . acetaminophen (TYLENOL) suppository 650 mg  650 mg Rectal Q6H PRN Milagros Loll, MD      . dextrose 5 %-0.45 % sodium chloride infusion   Intravenous Continuous Milagros Loll, MD 75 mL/hr at 01/03/16 1645    . latanoprost (XALATAN) 0.005 % ophthalmic solution 1 drop  1 drop Both Eyes QHS Milagros Loll, MD      . Derrill Memo ON 01/04/2016] levothyroxine (SYNTHROID, LEVOTHROID) tablet 25 mcg  25 mcg Oral QAC breakfast Milagros Loll, MD      . pantoprazole (PROTONIX) 80 mg in sodium chloride 0.9 % 250 mL (0.32 mg/mL) infusion  8 mg/hr Intravenous Continuous Parag Brahmbhatt, MD 25 mL/hr at 01/03/16 1719 8 mg/hr at 01/03/16 1719  . [START ON 01/07/2016] pantoprazole (PROTONIX) injection 40 mg  40 mg Intravenous Q12H Parag Brahmbhatt, MD      . sodium chloride flush (NS) 0.9 % injection 3 mL  3 mL Intravenous Q12H Milagros Loll, MD        Allergies: Allergies as of 01/03/2016  . (No Known Allergies)   Past Medical History:  Diagnosis Date  . AAA (abdominal aortic aneurysm) ([place])    s/p stent graft in  September 2009 & with attempted embolization/occlusion of vessels for a type 2 leak around the stent graft; Managed by Dr. Oneida Alar; last CT in 2012 showing the leak had closed.   . Anemia    on iron pills  . Aortic stenosis    s/p AVR in September 2012 per Dr. Cyndia Bent  . Arthritis   . Atrial fibrillation [facility])    s/p cardioversion October 2012  . CHF (congestive heart failure) ([place])    SEPT AND OCT 2012; follow up echo in October shows EF of 50%.   . Chronic anticoagulation   . Coronary artery disease    Remote CABG x 5 in 2000  . Emphysema of lung ([place])   . Gallstones    s/p cholecystectomy in Dec 2012  . Gout   . High risk medication use    on amiodarone  . History of carotid stenosis    s/p L CEA in September 2006  . Hyperlipidemia   . Hypertension   . Inguinal hernia    RIH  . PAT (paroxysmal atrial tachycardia) ([place])   . PVC's (premature ventricular contractions)   . Stroke [facility]) 2006   three strokes -no residuals  . Urinary frequency    at night  . Wears glasses    Past Surgical History:  Procedure Laterality Date  . ABDOMINAL AORTAGRAM N/A 12/25/2011   Procedure: ABDOMINAL Maxcine Ham;  Surgeon: Elam Dutch, MD;  Location: [facility] CATH LAB;  Service: Cardiovascular;  Laterality: N/A;  . ABDOMINAL AORTIC ANEURYSM REPAIR  2008   Gore Excluder Stent Graft repair  . ABDOMINAL AORTIC ANEURYSM REPAIR  2010/2012  . AORTIC VALVE REPLACEMENT  10/24/10   AVR  #25 mm Magna Ease pericardial valve  . CARDIAC CATHETERIZATION  04/29/1998   EF 60%  . CARDIOVASCULAR STRESS TEST  08/19/2009  . CARDIOVERSION N/A 11/11/2014   Procedure: CARDIOVERSION;  Surgeon: [name], MD;  Location: [place];  Service: Cardiovascular;  Laterality: N/A;  . CAROTID ENDARTERECTOMY  2006   Left Side  . CHOLECYSTECTOMY  01/23/2011   Procedure: LAPAROSCOPIC CHOLECYSTECTOMY WITH INTRAOPERATIVE CHOLANGIOGRAM;  Surgeon: Rolm Bookbinder, MD;  Location: [facility] ORS;  Service: General;  Laterality: N/A;    . COLON SURGERY     lap right colon  . CORONARY ARTERY BYPASS GRAFT  1970   bypass and open heart surgery--pt states this is incorrect--his only cabg was in 2000  . CORONARY ARTERY BYPASS GRAFT  2000   5 vessel BY DR.BARTEL. [place] GRAFT TO THE LAD, SEQUENTIAL SAPHENOUS VEIN GRAFT TO THE  FIRST DIAGONAL AND FIRST OBTUSE MARGINAL VESSELS, SAPHENOUS  VEIN GRAFT TO THE SECOND OBTUSE MARGINAL VESSEL, AND SAPHENOUS VEIN GRAFT TO THE PDA  . EYE SURGERY     bilateral cataract extractions  . HEMORRHOID SURGERY    . HEMORRHOID SURGERY    . HERNIA REPAIR  10/13/10   RIH  . JOINT REPLACEMENT     Right TKR  . REPLACEMENT TOTAL KNEE  2008  . TONSILLECTOMY    . US ECHOCARDIOGRAPHY  08/18/2009   EF 55-60%  . US ECHOCARDIOGRAPHY  04/22/2007   EF 55-60%   Family History  Problem Relation Age of Onset  . Other Mother     falopian tube during pregnancy   . Heart disease Father   . Cancer Brother     liver    Social History   Social History  . Marital status: Married    Spouse name: N/A  . Number of children: N/A  . Years of education: N/A   Occupational History  . Not on file.   Social History Main Topics  . Smoking status: Current Every Day Smoker    Types: Cigars  . Smokeless tobacco: Never Used     Comment: pt states he smokes 3-4 cigars every day  . Alcohol use 12.6 oz/week    21 Cans of beer per week  . Drug use: No  . Sexual activity: Not on file   Other Topics Concern  . Not on file   Social History Narrative  . No narrative on file    Review of Systems: Constitutional: positive for fatigue HEENT: negative for changes in vision Respiratory: negative for cough and SOB Cardiovascular: negative for chest pain, orthopnea and palpitations Gastrointestinal: negative for abdominal pain Genitourinary:negative for dysuria Musculoskeletal:positive for arthralgias Neurological: positive for weakness  Physical Exam: Blood pressure (!) 135/54, pulse (!) 58, temperature 98.8 F  (37.1 C), temperature source Oral, resp. rate 20, height 5\' 10"  (1.778 m), weight 82.5 kg (181 lb 14.1 oz), SpO2 100 %. BP (!) 135/54 (BP Location: Right Arm)   Pulse (!) 58   Temp 98.8 F (37.1 C) (Oral)   Resp 20   Ht 5\' 10"  (1.778 m)   Wt 82.5 kg (181 lb 14.1 oz)   SpO2 100%   BMI 26.10 kg/m  General appearance: alert, cooperative, appears stated age, no distress and pale HEENT: normocephalic and atruamatic PERRL. MMM. Anicteric. No oral lesions present.  Cv: Sternal scar present. Irregular rate and rhythm. Nl s1 s2. Early peaking III/VI Systolic murmur radiating to both carotids loudest in upper sternal borders. Distal pulses 2+. Warm extremities. No LE edema. Resp: No appearent scars on back. NWB. CTAB.  Abdomen: NBS. NTND.  Rectal: Deferred 2/2 to ED eval Neurologic: Facial movements symmetric. No dysarthria. Uvula and tongue midline. EOM intact. No dysphagia or dysphonia.   Lab results: CBC notable for H/H of 8.9/25.9, platelets of 108. CMP was notable for BUN of 83. INR was 3.09. Lactic acid was wnl. Fecal occult was positive. UA was notable for hyaline casts. TSH wnl.   Type and screen was O+ and negative.   Imaging results:  Dg Chest Portable 1 View  Result Date: 01/03/2016 CLINICAL DATA:  Syncope EXAM: PORTABLE CHEST 1 VIEW COMPARISON:  09/07/2014 FINDINGS: Normal heart size and mediastinal contours after CABG and aortic valve replacement. There is no edema, consolidation, effusion, or pneumothorax. No acute osseous finding. EKG leads create artifact over the chest. IMPRESSION: Stable.  No evidence of acute disease. Electronically Signed   By: Neva Seat.D.  On: 01/03/2016 13:57    Other results: EKG: Atrial fibrillation with normal rates.   Assessment & Plan by Problem: Active Problems:   GI bleed  cung, drennon is a 82 yo M with a significant PMH of atrial fibrillation currently on warfarin, Aortic Stenosis s/p tissue AVR 10/2010, HFpEF (echo 08/2014), CAD  s/p CAPG 5 vessel 2000, AAA s/p endograft with persistent endoleak who presented to EMS with syncope 2/2 to GI bleed.   Upper GI Bleed The patient's three day history of black stools, weakness, orthostatic syncope, everyday use of Aleve and warfarin most likely points to a upper GI bleed. The decrease in H/H from 12.7/35.6>8.9/25.9, positive fecal occult blood and pallor on exam is suggestive of this as well. This would explain the elevated BUN out of proportion to creatinine. Others to include are an arrhythmia, valsalva induced syncope, AAA repair fistula, an RCA infarct leading to a sinus sick syndrome, seizure, and diverticulosis.    - tele  - vitals q4  - Monitor stools for melena/hematochezia   - cbc q12  - PT/INR qd  - BMP qd  - prepare O+ pRBCs Transfuse <8 Hgb  - Stop Warfarin  - Stop NSAIDS  - Per GI, INR<2 for upper EGD  - Clear liquids until midnight  - NPO at midnight  - Pantoprazole 40 mg q12  Hypovolemia  Although the exam showed MMM, his recent increase to lasix 40mg  qd may have contributed his orthostatic syncope as suggested by hyaline casts in urine sed and elevated BUN/Cr ratio>20.    - hold furosemide 20 mg PRN 2/2 to soft pressures and hypovolemia  - mIVF 120 ml/hr NS   Atrial fibrillation Patient is in a fib presently but bradycardic. CHADSVASC score of 7.   - tele as above  - Stop rate control carvedilol 6.25 qd 2/2 to bradycardia and soft pressures  - Monitor for RVR with 5 mg IV metoprolol PRN  - Stop Warfarin as above  HFpEF No signs of volume overload.   - Hold furosemide as above  - hold losartan 25 mg qd 2/2 to soft pressures  - ECHO to revaulate  - Strict I&Os  - ASA 81 mg qd   HTN   - Hold Carvedilol as above  - Hold furosemide as above  - hold losartan as above  Hypothyroidism  - TSH of 3.576  - continue levothyroxine 81mcg qd  Right Knee Arthritis  - Hold aleve   FEN/GI  - NPO  - DVT prophylaxis: SCDs 2/2 to bleed  Access  - pIV  x2  Resuscitation  - DNR  This is a Careers information officer Note.  The care of the patient was discussed with Dr. Wynetta Emery and the assessment and plan was formulated with their assistance.  Please see their note for official documentation of the patient encounter.   Signed: Benn Moulder, Medical Student 01/03/2016, 5:26 PM

## 2016-01-03 NOTE — H&P (Signed)
Date: 01/03/2016               Patient Name:  [name] MRN: [id number]  DOB: [birth year] Age / Sex: 82 y.o., male   PCP: Tamsen Roers, MD         Medical Service: Internal Medicine Teaching Service         Attending Physician: Dr. Algis Greenhouse    First Contact: Dr. [name] Pager: [phone]  Second Contact: Dr. Burgess Estelle Pager: [phone]       After Hours (After 5p/  First Contact Pager: [phone]  weekends / holidays): Second Contact Pager: [phone]   Chief Complaint: Syncope, melena  History of Present Illness: [name] is a 82 y.o. gentleman with PMH AAA s/p repair 2010/2012, AS s/p bioprosthetic AVR on Coumadin, chronic Afib, HFpEF (LVEF 55-60% in 08/2014), CAD s/p CABGx5 in 2000, emphysema, gout,  hypthyroidism, carotid stenosis, HTN, HLD, CVA x3, and DJD who presents for syncope and melena. Per the patient and his wife, his symptoms first began yesterday. He was out on his porch smoking his daily 2 cigars when he finished and ascended the ramp with his walker to get inside. He opened the back door but then suddenly felt dizzy, nauseous, and clammy/diaphoretic. His wife noticed him and he slowly pitched over his walker, she helped him sit on the floor/lay down, however his symptoms persisted. He eventually felt well enough to sit on the couch and then walk again. He denied any chest pain/pressure or shortness of breath with this episode. His wife also noticed that he had a black tarry stool yesterday, which was the first she had noticed. His malaise recurred this morning and worsened despite eating breakfast. He sat on the toilet and had another black stool. He felt so bad that he told her to call 911 to go to the hospital. When the EMTs arrived and picked him up from the toilet he had a brief syncopal episode. EMS reports his HR in 30s and BP in 70s and he was momentarily unresponsive. He received IVF en route to hospital. Of note he recently increased his Lasix  from 20mg  to 40mg  QD for three days over this past weekend for increased LE edema (instructed per RNs at his cardiologists office) and has had increased urine output and has been very thirsty. He denies abdominal pain, fevers, chills, palpitations, orthostatic symptoms, constipation, diarrhea. He takes Aleve nightly and had a colonoscopy approx 5 years ago.  In the ED he was hypothermic to 95.9 F, HR 61, RR 20, BP 133/64, SpO2 98% on RA with pallor and melena visible on rectal exam. CBC remarkable for Hb 8.9 (from 12.7 in 06/2015), MCV 102, plts 108. BMP remarkable for CO2 20, BUN 83, Cr 1.24 (baseline 1.0). INR 3.09. CXR without evidence of acute disease and EKG showed a-fib with widened QRS, increased L axis deviation from previous. IMTS contacted for admission.  Meds:  Current Meds  Medication Sig  . aspirin EC 81 MG tablet Take 81 mg by mouth daily.  . carvedilol (COREG) 6.25 MG tablet Take 3.125 mg by mouth 2 (two) times daily with a meal.  . cyanocobalamin (,VITAMIN B-12,) 1000 MCG/ML injection Inject 1,000 mcg into the muscle every 30 (thirty) days.  . dorzolamide-timolol (COSOPT) 22.3-6.8 MG/ML ophthalmic solution 1 drop 2 (two) times daily.  [name] doxylamine, Sleep, (UNISOM) 25 MG tablet Take 25 mg by mouth at bedtime.  . furosemide (LASIX) 20 MG tablet Take 1  tablet (20 mg total) by mouth daily as needed for edema (for edema and shortness of breath). (Patient taking differently: Take 40 mg by mouth daily as needed for edema (for edema and shortness of breath). For 3 days only started on Thursday-Saturday. Cont back to 20 mg after 3 day of 40 mg. ( per Md))  . latanoprost (XALATAN) 0.005 % ophthalmic solution Place 1 drop into both eyes at bedtime.  [name] levothyroxine (SYNTHROID, LEVOTHROID) 25 MCG tablet TAKE ONE TABLET BY MOUTH BEFORE BREAKFAST. MUST HAVE OFFICE VISIT BEFORE FURTHER REFILLS.  [name] losartan (COZAAR) 25 MG tablet Take 1 tablet (25 mg total) by mouth daily.  . Multiple Vitamin  (MULTIVITAMIN) tablet Take 1 tablet by mouth 2 (two) times daily.   . Naproxen Sodium (ALEVE) 220 MG CAPS Take 1 capsule by mouth as needed.  . Oxymetazoline HCl (NASAL SPRAY) 0.05 % SOLN Place into the nose as needed.  . warfarin (COUMADIN) 7.5 MG tablet Take 7.5-11.25 mg by mouth daily. 7.5 mg every day except on Monday patient takes 11.25 mg.( 1 and 1/2 tablet )    Allergies: Allergies as of 01/03/2016  . (No Known Allergies)   Past Medical History:  Diagnosis Date  . AAA (abdominal aortic aneurysm) ([place])    s/p stent graft in September 2009 & with attempted embolization/occlusion of vessels for a type 2 leak around the stent graft; Managed by Dr. Oneida Alar; last CT in 2012 showing the leak had closed.   . Anemia    on iron pills  . Aortic stenosis    s/p AVR in September 2012 per Dr. Cyndia Bent  . Arthritis   . Atrial fibrillation [facility])    s/p cardioversion October 2012  . CHF (congestive heart failure) ([place])    SEPT AND OCT 2012; follow up echo in October shows EF of 50%.   . Chronic anticoagulation   . Coronary artery disease    Remote CABG x 5 in 2000  . Emphysema of lung ([place])   . Gallstones    s/p cholecystectomy in Dec 2012  . Gout   . High risk medication use    on amiodarone  . History of carotid stenosis    s/p L CEA in September 2006  . Hyperlipidemia   . Hypertension   . Inguinal hernia    RIH  . PAT (paroxysmal atrial tachycardia) ([place])   . PVC's (premature ventricular contractions)   . Stroke [facility]) 2006   three strokes -no residuals  . Urinary frequency    at night  . Wears glasses     Family History:  Family History  Problem Relation Age of Onset  . Other Mother     falopian tube during pregnancy   . Heart disease Father   . Cancer Brother     liver     Social History:  Social History   Social History  . Marital status: Married    Spouse name: N/A  . Number of children: N/A  . Years of education: N/A   Occupational History  . Not on file.     Social History Main Topics  . Smoking status: Current Every Day Smoker    Types: Cigars  . Smokeless tobacco: Never Used     Comment: pt states he smokes 3-4 cigars every day  . Alcohol use 12.6 oz/week    21 Cans of beer per week  . Drug use: No  . Sexual activity: Not on file   Other Topics Concern  .  Not on file   Social History Narrative  . No narrative on file    Review of Systems: A complete ROS was negative except as per HPI.   Physical Exam: Blood pressure 133/64, pulse 61, temperature (!) 95.9 F (35.5 C), temperature source Rectal, resp. rate 20, height 5\' 11"  (1.803 m), weight 81.6 kg (180 lb), SpO2 100 %.  General appearance: Elderly gentleman resting comfortably in ER bed, in no distress, conversational HENT: Normocephalic, atraumatic, moist mucous membranes, neck supple, no JVD Eyes: PERRL, EOM inact, non-icteric Cardiovascular: Irregular rhythm with normal rate, soft ejection murmur, no rubs, gallops Respiratory: Clear to auscultation bilaterally, normal work of breathing Abdomen: BS+, soft, non-tender, non-distended Extremities: Normal bulk and range of motion, trace pitting edema to mid calves bilaterally, 2+ peripheral pulses Skin: Warm, dry, intact, scattered solar lentigo and hyperpigmented plaques over extremities and scalp Neuro: Alert and oriented, cranial nerves grossly intact Psych: Appropriate affect, clear speech, thoughts linear and goal-directed  EKG: Afib with widened QRS, increased L axis deviation from previous.   CXR: No evidence of acute disease  Assessment & Plan by Problem:  [name] is a 82 y.o. gentleman with PMH AAA s/p repair 2010/2012, IDA, AS s/p bioprosthetic AVR on Coumadin, chronic Afib, HFpEF (LVEF 55-60% in 08/2014), CAD s/p CABGx5 in 2000, emphysema, gout, carotid stenosis, HTN, HLD, CVA x3, and DJD admitted for syncope and melena.  Active Problems:  GI bleed, 2-3 days of melena at home, on Warfarin for prosthetic  AV, endorses daily NSAID use, ongoing tobacco and alcohol use. Hemoglobin 8.9 from 12.7 in May 2017. Hemodynamically stable but melanotic stool visible on ED rectal exam and FOBT+. Colonoscopy 5 years ago but has never had EGD, concern for UGIB from PUD or malignancy. - Appreciate GI recs, NPO at MN for EGD tomorrow - Follow AM CBC, transfuse Hb < 8 given significant CVD - Hold Warfarin, follow AM INR, goal INR <2 for EGD - Protonix 40mg  BID - Hold home Asprin - Clear liquid diet until MN - Avoid NSAIDs  Syncope, may be secondary to hypovolemia with recent increased diuresis, bradycardia (BB dose), arrhythmia, worsening CHF, symptom improvement following IVF resusc via EMS - Telemetry - TTE  - mIVF overnight , 75 cc D5 1/2 NS - Check orthostatic vitals - PT eval and treat to assess functional status/symptom recurrence  HFpEF, (LVEF 55-60% in 08/2014 with severe LAE and moderate RAE) - Update TTE - Strict I/Os and daily weights - Hold home lasix 20 QD PRN given current concern for overdiuresis - Holding home Coreg 6.25 mg daily - Holding home Losartan 25 mg daily - Holding home Aspirin 81mg   Atrial fibrillation, CHADSVASc score 7, ongoing but appears rate-controlled.  - Telemetry, monitor for RVR  - Holding Warfarin and Coreg  Bioprosthetic aortic valve, goal INR 2-3 on chronic Warfarin - Hold Warfarin given GIB  HTN, currently with soft blood pressures  - holding home Coreg and Losartan  Hypothyroidism, TSH of 3.576  - Continue levothyroxine 35mcg qd  Knee osteoarthritis, MSK  - Tylenol PRN - Avoid NSAIDs  FEN/GI: Clear liquid diet, NPO at midnight, replete electrolytes as needed  DVT ppx: SCDs  Code status: DNR  Dispo: Admit patient to Observation with expected length of stay less than 2 midnights.  Signed: [name], MD 01/03/2016, 2:11 PM  Pager: [phone]

## 2016-01-03 NOTE — ED Triage Notes (Signed)
Pt arrives EMS from home where they were called when pt had syncopal episode while up to toilet. Pt had similar episode last night that resolved after lying on floor. EMS states pt was pale ,non responsive with brady rate of 38 and bp 78/31on their arrival. Gave NS 900cc.  Pt has hx of black stools since yesterday and AAA that is non operable. On coumadin

## 2016-01-03 NOTE — ED Notes (Signed)
Physicians at bedside speaking with pt. Could not take pt up at this time.

## 2016-01-03 NOTE — ED Provider Notes (Signed)
[place] DEPT Provider Note   CSN: [id number] Arrival date & time: 01/03/16  1206     History   Chief Complaint Chief Complaint  Patient presents with  . Near Syncope  . Shortness of Breath  . Diarrhea    HPI [name] is a 82 y.o. male.  HPI  82 year old male presents with syncope. History is taken from the wife. Patient yesterday had a near syncopal episode while on his walker. Today he had to go have a bowel movement and also know a toilet asked his wife to call EMS because of near-syncope. When EMS try to get him up off the toilet he passed out. They report his heart rate in the 30s and blood pressure in the 70s and he was unresponsive. Was given some fluids. Patient to has no current complaints but his wife states that he had a black tarry bowel movement yesterday. He is on warfarin for an aortic valve replacement. He has not seen any bright blood. Denies abdominal pain or chest pain.  Past Medical History:  Diagnosis Date  . AAA (abdominal aortic aneurysm) ([place])    s/p stent graft in September 2009 & with attempted embolization/occlusion of vessels for a type 2 leak around the stent graft; Managed by Dr. Oneida Alar; last CT in 2012 showing the leak had closed.   . Anemia    on iron pills  . Aortic stenosis    s/p AVR in September 2012 per Dr. Cyndia Bent  . Arthritis   . Atrial fibrillation [facility])    s/p cardioversion October 2012  . CHF (congestive heart failure) ([place])    SEPT AND OCT 2012; follow up echo in October shows EF of 50%.   . Chronic anticoagulation   . Coronary artery disease    Remote CABG x 5 in 2000  . Emphysema of lung ([place])   . Gallstones    s/p cholecystectomy in Dec 2012  . Gout   . High risk medication use    on amiodarone  . History of carotid stenosis    s/p L CEA in September 2006  . Hyperlipidemia   . Hypertension   . Inguinal hernia    RIH  . PAT (paroxysmal atrial tachycardia) ([place])   . PVC's (premature ventricular contractions)    . Stroke [facility]) 2006   three strokes -no residuals  . Urinary frequency    at night  . Wears glasses     Patient Active Problem List   Diagnosis Date Noted  . GI bleed 01/03/2016  . Chronic systolic heart failure ([place]) 11/10/2014  . Long-term (current) use of anticoagulants 09/14/2014  . Acute on chronic systolic CHF (congestive heart failure) ([place]) 09/09/2014  . Essential hypertension   . Shortness of breath   . Hypertensive urgency 09/08/2014  . Epistaxis 01/15/2014  . AAA (abdominal aortic aneurysm) ([place]) 05/22/2012  . Occlusion and stenosis of carotid artery without mention of cerebral infarction 12/14/2011  . S/P AVR (aortic valve replacement) 07/31/2011  . Hypothyroidism 04/28/2011  . Abdominal pain 12/26/2010  . Dilated cardiomyopathy ([place]) 11/16/2010  . Coronary artery disease   . Hypertension   . Hyperlipidemia   . Aortic valve disorders 11/08/2010  . Atrial fibrillation ([place]) 10/31/2010  . On warfarin therapy 10/31/2010  . Inguinal hernia unilateral, non-recurrent 10/03/2010  . ATHEROSCLEROTIC CARDIOVASCULAR DISEASE 10/01/2008  . NONSPECIFIC ABN FINDING RAD & OTH EXAM GI TRACT 10/01/2008    Past Surgical History:  Procedure Laterality Date  . ABDOMINAL AORTAGRAM N/A  12/25/2011   Procedure: ABDOMINAL Maxcine Ham;  Surgeon: Elam Dutch, MD;  Location: [facility] CATH LAB;  Service: Cardiovascular;  Laterality: N/A;  . ABDOMINAL AORTIC ANEURYSM REPAIR  2008   Gore Excluder Stent Graft repair  . ABDOMINAL AORTIC ANEURYSM REPAIR  2010/2012  . AORTIC VALVE REPLACEMENT  10/24/10   AVR  #25 mm Magna Ease pericardial valve  . CARDIAC CATHETERIZATION  04/29/1998   EF 60%  . CARDIOVASCULAR STRESS TEST  08/19/2009  . CARDIOVERSION N/A 11/11/2014   Procedure: CARDIOVERSION;  Surgeon: [name], MD;  Location: [facility];  Service: Cardiovascular;  Laterality: N/A;  . CAROTID ENDARTERECTOMY  2006   Left Side  . CHOLECYSTECTOMY  01/23/2011   Procedure: LAPAROSCOPIC  CHOLECYSTECTOMY WITH INTRAOPERATIVE CHOLANGIOGRAM;  Surgeon: Rolm Bookbinder, MD;  Location: [facility] ORS;  Service: General;  Laterality: N/A;  . COLON SURGERY     lap right colon  . CORONARY ARTERY BYPASS GRAFT  1970   bypass and open heart surgery--pt states this is incorrect--his only cabg was in 2000  . CORONARY ARTERY BYPASS GRAFT  2000   5 vessel BY DR.BARTEL. [place] GRAFT TO THE LAD, SEQUENTIAL SAPHENOUS VEIN GRAFT TO THE  FIRST DIAGONAL AND FIRST OBTUSE MARGINAL VESSELS, SAPHENOUS VEIN GRAFT TO THE SECOND OBTUSE MARGINAL VESSEL, AND SAPHENOUS VEIN GRAFT TO THE PDA  . EYE SURGERY     bilateral cataract extractions  . HEMORRHOID SURGERY    . HEMORRHOID SURGERY    . HERNIA REPAIR  10/13/10   RIH  . JOINT REPLACEMENT     Right TKR  . REPLACEMENT TOTAL KNEE  2008  . TONSILLECTOMY    . US ECHOCARDIOGRAPHY  08/18/2009   EF 55-60%  . US ECHOCARDIOGRAPHY  04/22/2007   EF 55-60%       Home Medications    Prior to Admission medications   Medication Sig Start Date End Date Taking? Authorizing Provider  aspirin EC 81 MG tablet Take 81 mg by mouth daily.   Yes Historical Provider, MD  carvedilol (COREG) 6.25 MG tablet Take 3.125 mg by mouth 2 (two) times daily with a meal.   Yes Historical Provider, MD  cyanocobalamin (,VITAMIN B-12,) 1000 MCG/ML injection Inject 1,000 mcg into the muscle every 30 (thirty) days.   Yes Historical Provider, MD  dorzolamide-timolol (COSOPT) 22.3-6.8 MG/ML ophthalmic solution 1 drop 2 (two) times daily.   Yes Historical Provider, MD  doxylamine, Sleep, (UNISOM) 25 MG tablet Take 25 mg by mouth at bedtime.   Yes Historical Provider, MD  furosemide (LASIX) 20 MG tablet Take 1 tablet (20 mg total) by mouth daily as needed for edema (for edema and shortness of breath). Patient taking differently: Take 40 mg by mouth daily as needed for edema (for edema and shortness of breath). For 3 days only started on Thursday-Saturday. Cont back to 20 mg after 3 day of 40 mg. ( per  Md) 10/27/15  Yes Peter M Martinique, MD  latanoprost (XALATAN) 0.005 % ophthalmic solution Place 1 drop into both eyes at bedtime.   Yes Historical Provider, MD  levothyroxine (SYNTHROID, LEVOTHROID) 25 MCG tablet TAKE ONE TABLET BY MOUTH BEFORE BREAKFAST. MUST HAVE OFFICE VISIT BEFORE FURTHER REFILLS. 12/13/15  Yes Peter M Martinique, MD  losartan (COZAAR) 25 MG tablet Take 1 tablet (25 mg total) by mouth daily. 07/05/15  Yes Peter M Martinique, MD  Multiple Vitamin (MULTIVITAMIN) tablet Take 1 tablet by mouth 2 (two) times daily.    Yes Historical Provider, MD  Naproxen Sodium (  ALEVE) 220 MG CAPS Take 1 capsule by mouth as needed.   Yes Historical Provider, MD  Oxymetazoline HCl (NASAL SPRAY) 0.05 % SOLN Place into the nose as needed.   Yes Historical Provider, MD  warfarin (COUMADIN) 7.5 MG tablet Take 7.5-11.25 mg by mouth daily. 7.5 mg every day except on Monday patient takes 11.25 mg.( 1 and 1/2 tablet )   Yes Historical Provider, MD  carvedilol (COREG) 6.25 MG tablet TAKE ONE TABLET BY MOUTH TWICE DAILY WITH MEALS 12/24/15   Peter M Martinique, MD  clotrimazole ([facility] RELIEF) 1 % cream Apply 1 application topically as needed.    Historical Provider, MD  warfarin (COUMADIN) 7.5 MG tablet TAKE ONE TO ONE & ONE-HALF TABLETS BY MOUTH ONCE DAILY AS  DIRECTED  BY  COUMADIN  CLINIC Patient not taking: Reported on 01/03/2016 10/01/15   Peter M Martinique, MD    Family History Family History  Problem Relation Age of Onset  . Other Mother     falopian tube during pregnancy   . Heart disease Father   . Cancer Brother     liver     Social History Social History  Substance Use Topics  . Smoking status: Current Every Day Smoker    Types: Cigars  . Smokeless tobacco: Never Used     Comment: pt states he smokes 3-4 cigars every day  . Alcohol use 12.6 oz/week    21 Cans of beer per week     Allergies   Patient has no known allergies.   Review of Systems Review of Systems  Cardiovascular: Negative for  chest pain.  Gastrointestinal: Negative for abdominal pain and vomiting.  Neurological: Positive for syncope, weakness and light-headedness.  All other systems reviewed and are negative.    Physical Exam Updated Vital Signs BP (!) 118/48   Pulse 61   Temp 97.7 F (36.5 C) (Oral)   Resp 20   Ht 5\' 11"  (1.803 m)   Wt 180 lb (81.6 kg)   SpO2 96%   BMI 25.10 kg/m   Physical Exam  Constitutional: He is oriented to person, place, and time. He appears well-developed and well-nourished. No distress.  HENT:  Head: Normocephalic and atraumatic.  Right Ear: External ear normal.  Left Ear: External ear normal.  Nose: Nose normal.  Eyes: Right eye exhibits no discharge. Left eye exhibits no discharge.  Neck: Neck supple.  Cardiovascular: Normal rate and regular rhythm.   Murmur heard. Pulmonary/Chest: Effort normal and breath sounds normal.  Abdominal: Soft. There is no tenderness.  Genitourinary:  Genitourinary Comments: Black stool on rectal exam  Musculoskeletal: He exhibits no edema.  Neurological: He is alert and oriented to person, place, and time.  Skin: Skin is warm and dry. He is not diaphoretic. There is pallor.  Nursing note and vitals reviewed.    ED Treatments / Results  Labs (all labs ordered are listed, but only abnormal results are displayed) Labs Reviewed  COMPREHENSIVE METABOLIC PANEL - Abnormal; Notable for the following:       Result Value   Chloride 113 (*)    CO2 20 (*)    Glucose, Bld 129 (*)    BUN 83 (*)    Total Protein 5.1 (*)    Albumin 3.4 (*)    ALT 13 (*)    GFR calc non Af Amer 50 (*)    GFR calc Af Amer 58 (*)    All other components within normal limits  CBC WITH  DIFFERENTIAL/PLATELET - Abnormal; Notable for the following:    RBC 2.54 (*)    Hemoglobin 8.9 (*)    HCT 25.9 (*)    MCV 102.0 (*)    MCH 35.0 (*)    Platelets 108 (*)    All other components within normal limits  URINALYSIS, ROUTINE W REFLEX MICROSCOPIC (NOT AT [facility]) -  Abnormal; Notable for the following:    Leukocytes, UA TRACE (*)    All other components within normal limits  PROTIME-INR - Abnormal; Notable for the following:    Prothrombin Time 32.6 (*)    All other components within normal limits  URINE MICROSCOPIC-ADD ON - Abnormal; Notable for the following:    Squamous Epithelial / LPF 0-5 (*)    Bacteria, UA RARE (*)    Casts HYALINE CASTS (*)    All other components within normal limits  POC OCCULT BLOOD, ED - Abnormal; Notable for the following:    Fecal Occult Bld POSITIVE (*)    All other components within normal limits  TSH  I-STAT CG4 LACTIC ACID, ED  TYPE AND SCREEN  PREPARE RBC (CROSSMATCH)    EKG  EKG Interpretation None       Radiology Dg Chest Portable 1 View  Result Date: 01/03/2016 CLINICAL DATA:  Syncope EXAM: PORTABLE CHEST 1 VIEW COMPARISON:  09/07/2014 FINDINGS: Normal heart size and mediastinal contours after CABG and aortic valve replacement. There is no edema, consolidation, effusion, or pneumothorax. No acute osseous finding. EKG leads create artifact over the chest. IMPRESSION: Stable.  No evidence of acute disease. Electronically Signed   By: Monte Fantasia M.D.   On: 01/03/2016 13:57    Procedures Procedures (including critical care time)  Medications Ordered in ED Medications  levothyroxine (SYNTHROID, LEVOTHROID) tablet 25 mcg (not administered)  latanoprost (XALATAN) 0.005 % ophthalmic solution 1 drop (not administered)  sodium chloride flush (NS) 0.9 % injection 3 mL (not administered)  dextrose 5 %-0.45 % sodium chloride infusion (not administered)  acetaminophen (TYLENOL) tablet 650 mg (not administered)    Or  acetaminophen (TYLENOL) suppository 650 mg (not administered)  pantoprazole (PROTONIX) injection 40 mg (40 mg Intravenous Given 01/03/16 1449)  0.9 %  sodium chloride infusion (10 mL/hr Intravenous Transfusing/Transfer 01/03/16 1602)     Initial Impression / Assessment and Plan / ED  Course  I have reviewed the triage vital signs and the nursing notes.  Pertinent labs & imaging results that were available during my care of the patient were reviewed by me and considered in my medical decision making (see chart for details).  Clinical Course as of Jan 03 1603  [facility] Jan 03, 2016  [place] for hyopthermia. Labs, likely admission. Appears stable at this time.  [SG]  1349 D/w GI, Dr. Alessandra Bevels, will come eval  [SG]  1410 IM teaching service to admit  [SG]    Clinical Course User Index [SG] Sherwood Gambler, MD    Patient is hemodynamically stable. Given his acute on chronic anemia with black stool on warfarin, he will be admitted for GI bleeding. He does not appear to acutely need blood at this time. This was likely the cause of his lightheadedness. Abdominal exam is benign, I do not think CT scan is warranted. Given the elevated BUN, IV Protonix was given. Unclear why he was hypothermic on initial arrival, does not appear to have an acute infection  Final Clinical Impressions(s) / ED Diagnoses   Final diagnoses:  Gastrointestinal hemorrhage, unspecified gastrointestinal hemorrhage type  New Prescriptions New Prescriptions   No medications on file     Sherwood Gambler, MD 01/03/16 1605

## 2016-01-03 NOTE — Consult Note (Addendum)
Referring Provider: Verta Ellen Primary Care Physician:  Tamsen Roers, MD Primary Gastroenterologist:  Dr. Penelope Coop   Reason for Consultation:   Fabienne Bruns Bleed  HPI: [name] is a 82 y.o. male with past medical history of atrial fibrillation on Coumadin, history of  aortic valve replacement with a tissue prosthesis, coronary artery disease status post CABG, AAA repair was brought to the hospital for syncope. Patient was found to have anemia with hemoglobin of 8.9. GI is consulted for further evaluation.   History obtained from patient as well as from wife. According to patient, he has been having black tarry stool since last 3 days with 1-2 bowel movement per day. Denied any abdominal pain. Denied any bright red blood per rectum. Denied any nausea or vomiting. No history of GI bleed in the past. Patient is on Coumadin and  baby aspirin and daily Aleve for the knee pain. Denied diarrhea. Denied dysphagia or odynophagia. Denied chest pain or shortness of breath.   Colonoscopy Dr. Penelope Coop, 07/2009 showed Mass in Hepatic Flexure. Biopsy showed Tubulovillous adenoma . Underwent Lap right hemi colectomy in 08/2009.   Past Medical History:  Diagnosis Date  . AAA (abdominal aortic aneurysm) ([place])    s/p stent graft in September 2009 & with attempted embolization/occlusion of vessels for a type 2 leak around the stent graft; Managed by Dr. Oneida Alar; last CT in 2012 showing the leak had closed.   . Anemia    on iron pills  . Aortic stenosis    s/p AVR in September 2012 per Dr. Cyndia Bent  . Arthritis   . Atrial fibrillation [facility])    s/p cardioversion October 2012  . CHF (congestive heart failure) ([place])    SEPT AND OCT 2012; follow up echo in October shows EF of 50%.   . Chronic anticoagulation   . Coronary artery disease    Remote CABG x 5 in 2000  . Emphysema of lung ([place])   . Gallstones    s/p cholecystectomy in Dec 2012  . Gout   . High risk medication use    on amiodarone  . History of carotid  stenosis    s/p L CEA in September 2006  . Hyperlipidemia   . Hypertension   . Inguinal hernia    RIH  . PAT (paroxysmal atrial tachycardia) ([place])   . PVC's (premature ventricular contractions)   . Stroke [facility]) 2006   three strokes -no residuals  . Urinary frequency    at night  . Wears glasses     Past Surgical History:  Procedure Laterality Date  . ABDOMINAL AORTAGRAM N/A 12/25/2011   Procedure: ABDOMINAL Maxcine Ham;  Surgeon: Elam Dutch, MD;  Location[facility] CATH LAB;  Service: Cardiovascular;  Laterality: N/A;  . ABDOMINAL AORTIC ANEURYSM REPAIR  2008   Gore Excluder Stent Graft repair  . ABDOMINAL AORTIC ANEURYSM REPAIR  2010/2012  . AORTIC VALVE REPLACEMENT  10/24/10   AVR  #25 mm Magna Ease pericardial valve  . CARDIAC CATHETERIZATION  04/29/1998   EF 60%  . CARDIOVASCULAR STRESS TEST  08/19/2009  . CARDIOVERSION N/A 11/11/2014   Procedure: CARDIOVERSION;  Surgeon: [name], MD;  Location: [place];  Service: Cardiovascular;  Laterality: N/A;  . CAROTID ENDARTERECTOMY  2006   Left Side  . CHOLECYSTECTOMY  01/23/2011   Procedure: LAPAROSCOPIC CHOLECYSTECTOMY WITH INTRAOPERATIVE CHOLANGIOGRAM;  Surgeon: Rolm Bookbinder, MD;  Location: [facility] ORS;  Service: General;  Laterality: N/A;  . COLON SURGERY     lap right colon  .  CORONARY ARTERY BYPASS GRAFT  1970   bypass and open heart surgery--pt states this is incorrect--his only cabg was in 2000  . CORONARY ARTERY BYPASS GRAFT  2000   5 vessel BY DR.BARTEL. [place] GRAFT TO THE LAD, SEQUENTIAL SAPHENOUS VEIN GRAFT TO THE  FIRST DIAGONAL AND FIRST OBTUSE MARGINAL VESSELS, SAPHENOUS VEIN GRAFT TO THE SECOND OBTUSE MARGINAL VESSEL, AND SAPHENOUS VEIN GRAFT TO THE PDA  . EYE SURGERY     bilateral cataract extractions  . HEMORRHOID SURGERY    . HEMORRHOID SURGERY    . HERNIA REPAIR  10/13/10   RIH  . JOINT REPLACEMENT     Right TKR  . REPLACEMENT TOTAL KNEE  2008  . TONSILLECTOMY    . US ECHOCARDIOGRAPHY  08/18/2009    EF 55-60%  . US ECHOCARDIOGRAPHY  04/22/2007   EF 55-60%    Prior to Admission medications   Medication Sig Start Date End Date Taking? Authorizing Provider  aspirin EC 81 MG tablet Take 81 mg by mouth daily.   Yes Historical Provider, MD  carvedilol (COREG) 6.25 MG tablet Take 3.125 mg by mouth 2 (two) times daily with a meal.   Yes Historical Provider, MD  cyanocobalamin (,VITAMIN B-12,) 1000 MCG/ML injection Inject 1,000 mcg into the muscle every 30 (thirty) days.   Yes Historical Provider, MD  dorzolamide-timolol (COSOPT) 22.3-6.8 MG/ML ophthalmic solution 1 drop 2 (two) times daily.   Yes Historical Provider, MD  doxylamine, Sleep, (UNISOM) 25 MG tablet Take 25 mg by mouth at bedtime.   Yes Historical Provider, MD  furosemide (LASIX) 20 MG tablet Take 1 tablet (20 mg total) by mouth daily as needed for edema (for edema and shortness of breath). Patient taking differently: Take 40 mg by mouth daily as needed for edema (for edema and shortness of breath). For 3 days only started on Thursday-Saturday. Cont back to 20 mg after 3 day of 40 mg. ( per Md) 10/27/15  Yes Peter M Martinique, MD  latanoprost (XALATAN) 0.005 % ophthalmic solution Place 1 drop into both eyes at bedtime.   Yes Historical Provider, MD  levothyroxine (SYNTHROID, LEVOTHROID) 25 MCG tablet TAKE ONE TABLET BY MOUTH BEFORE BREAKFAST. MUST HAVE OFFICE VISIT BEFORE FURTHER REFILLS. 12/13/15  Yes Peter M Martinique, MD  losartan (COZAAR) 25 MG tablet Take 1 tablet (25 mg total) by mouth daily. 07/05/15  Yes Peter M Martinique, MD  Multiple Vitamin (MULTIVITAMIN) tablet Take 1 tablet by mouth 2 (two) times daily.    Yes Historical Provider, MD  Naproxen Sodium (ALEVE) 220 MG CAPS Take 1 capsule by mouth as needed.   Yes Historical Provider, MD  Oxymetazoline HCl (NASAL SPRAY) 0.05 % SOLN Place into the nose as needed.   Yes Historical Provider, MD  warfarin (COUMADIN) 7.5 MG tablet Take 7.5-11.25 mg by mouth daily. 7.5 mg every day except on  Monday patient takes 11.25 mg.( 1 and 1/2 tablet )   Yes Historical Provider, MD  carvedilol (COREG) 6.25 MG tablet TAKE ONE TABLET BY MOUTH TWICE DAILY WITH MEALS 12/24/15   Peter M Martinique, MD  clotrimazole ([facility] RELIEF) 1 % cream Apply 1 application topically as needed.    Historical Provider, MD  warfarin (COUMADIN) 7.5 MG tablet TAKE ONE TO ONE & ONE-HALF TABLETS BY MOUTH ONCE DAILY AS  DIRECTED  BY  COUMADIN  CLINIC Patient not taking: Reported on 01/03/2016 10/01/15   Peter M Martinique, MD    Scheduled Meds: Continuous Infusions: PRN Meds:.  Allergies as  of 01/03/2016  . (No Known Allergies)    Family History  Problem Relation Age of Onset  . Other Mother     falopian tube during pregnancy   . Heart disease Father   . Cancer Brother     liver     Social History   Social History  . Marital status: Married    Spouse name: N/A  . Number of children: N/A  . Years of education: N/A   Occupational History  . Not on file.   Social History Main Topics  . Smoking status: Current Every Day Smoker    Types: Cigars  . Smokeless tobacco: Never Used     Comment: pt states he smokes 3-4 cigars every day  . Alcohol use 12.6 oz/week    21 Cans of beer per week  . Drug use: No  . Sexual activity: Not on file   Other Topics Concern  . Not on file   Social History Narrative  . No narrative on file    Review of Systems: All negative except as stated above in HPI.  Physical Exam: Vital signs: Vitals:   01/03/16 1415 01/03/16 1430  BP: (!) 110/49 (!) 109/50  Pulse: 61   Resp: 18 17  Temp:       General:   Alert,  Somewhat agitated and cooperative. NAD HEENT : NS, AT  Lungs:  Clear throughout to auscultation.   No wheezes, crackles, or rhonchi. No acute distress. Heart:  Regular rate and rhythm; no murmurs, clicks, rubs,  or gallops.Scar from previous surgery noted.  Abdomen:  Soft, NT, ND , BS +  LE : trace edema  Rectal:  Deferred. Was done by ER physician.    GI:  Lab Results:  Recent Labs  01/03/16 1235  WBC 8.8  HGB 8.9*  HCT 25.9*  PLT 108*   BMET  Recent Labs  01/03/16 1235  NA 140  K 4.5  CL 113*  CO2 20*  GLUCOSE 129*  BUN 83*  CREATININE 1.24  CALCIUM 9.1   LFT  Recent Labs  01/03/16 1235  PROT 5.1*  ALBUMIN 3.4*  AST 19  ALT 13*  ALKPHOS 41  BILITOT 0.7   PT/INR  Recent Labs  01/03/16 1235  LABPROT 32.6*  INR 3.09     Studies/Results: Dg Chest Portable 1 View  Result Date: 01/03/2016 CLINICAL DATA:  Syncope EXAM: PORTABLE CHEST 1 VIEW COMPARISON:  09/07/2014 FINDINGS: Normal heart size and mediastinal contours after CABG and aortic valve replacement. There is no edema, consolidation, effusion, or pneumothorax. No acute osseous finding. EKG leads create artifact over the chest. IMPRESSION: Stable.  No evidence of acute disease. Electronically Signed   By: Monte Fantasia M.D.   On: 01/03/2016 13:57    Impression/Plan: Melena in setting of Coumadin, aspirin and NSAID use. Need to rule out ulcer disease. Atrial fibrillation. On Coumadin. INR 3.09 today History of prosthetic aortic wall. Tissue prosthesis. Coronary artery disease status post CABG History of AAA repair - History of right hemicolectomy for large tubovillous adenoma at hepatic flexure in 2011   Recommendations ------------------------ - Patient is hemodynamically stable at this time. - Monitor H&H and  INR. Protonix Drip . Hold coumadin if ok with Primary team or cardiology  - Patient will benefit from endoscopic evaluation with EGD . Risk and benefits of the procedure discussed with the patient and the wife.  - INR needs to be < 2 prior to procedure to achieve adequate hemostasis if  there is evidence of active bleeding during EGD  - Patient with no active cardiac issues at this time. - Okay to have clear liquid diet today. Nothing by mouth past midnight. - Avoid NSAIDs. - GI will follow.    LOS: 0 days   Otis Brace   MD, FACP 01/03/2016, 2:57 PM  Pager [phone] If no answer or after 5 PM call [phone]

## 2016-01-03 NOTE — ED Notes (Signed)
Went in to take pt upstairs and hospitalist was in speaking with pt.

## 2016-01-03 NOTE — ED Notes (Signed)
Admit MD at bedside

## 2016-01-03 NOTE — ED Notes (Addendum)
Placed pt on [employer]. Assisted by Levada Dy (RN)

## 2016-01-04 ENCOUNTER — Other Ambulatory Visit (HOSPITAL_COMMUNITY): Payer: Medicare HMO

## 2016-01-04 ENCOUNTER — Encounter (HOSPITAL_COMMUNITY): Payer: Self-pay | Admitting: General Practice

## 2016-01-04 DIAGNOSIS — I739 Peripheral vascular disease, unspecified: Secondary | ICD-10-CM

## 2016-01-04 DIAGNOSIS — E861 Hypovolemia: Secondary | ICD-10-CM | POA: Diagnosis present

## 2016-01-04 DIAGNOSIS — K922 Gastrointestinal hemorrhage, unspecified: Secondary | ICD-10-CM | POA: Diagnosis present

## 2016-01-04 DIAGNOSIS — E785 Hyperlipidemia, unspecified: Secondary | ICD-10-CM | POA: Diagnosis present

## 2016-01-04 DIAGNOSIS — I482 Chronic atrial fibrillation: Secondary | ICD-10-CM | POA: Diagnosis present

## 2016-01-04 DIAGNOSIS — Z7982 Long term (current) use of aspirin: Secondary | ICD-10-CM | POA: Diagnosis not present

## 2016-01-04 DIAGNOSIS — E039 Hypothyroidism, unspecified: Secondary | ICD-10-CM | POA: Diagnosis present

## 2016-01-04 DIAGNOSIS — I130 Hypertensive heart and chronic kidney disease with heart failure and stage 1 through stage 4 chronic kidney disease, or unspecified chronic kidney disease: Secondary | ICD-10-CM | POA: Diagnosis present

## 2016-01-04 DIAGNOSIS — Z66 Do not resuscitate: Secondary | ICD-10-CM | POA: Diagnosis present

## 2016-01-04 DIAGNOSIS — I110 Hypertensive heart disease with heart failure: Secondary | ICD-10-CM

## 2016-01-04 DIAGNOSIS — I951 Orthostatic hypotension: Secondary | ICD-10-CM | POA: Diagnosis not present

## 2016-01-04 DIAGNOSIS — Z8489 Family history of other specified conditions: Secondary | ICD-10-CM

## 2016-01-04 DIAGNOSIS — N182 Chronic kidney disease, stage 2 (mild): Secondary | ICD-10-CM | POA: Diagnosis present

## 2016-01-04 DIAGNOSIS — I2510 Atherosclerotic heart disease of native coronary artery without angina pectoris: Secondary | ICD-10-CM | POA: Diagnosis present

## 2016-01-04 DIAGNOSIS — Z951 Presence of aortocoronary bypass graft: Secondary | ICD-10-CM | POA: Diagnosis not present

## 2016-01-04 DIAGNOSIS — T39315A Adverse effect of propionic acid derivatives, initial encounter: Secondary | ICD-10-CM | POA: Diagnosis present

## 2016-01-04 DIAGNOSIS — Z953 Presence of xenogenic heart valve: Secondary | ICD-10-CM | POA: Diagnosis not present

## 2016-01-04 DIAGNOSIS — K921 Melena: Secondary | ICD-10-CM | POA: Diagnosis not present

## 2016-01-04 DIAGNOSIS — Z8673 Personal history of transient ischemic attack (TIA), and cerebral infarction without residual deficits: Secondary | ICD-10-CM | POA: Diagnosis not present

## 2016-01-04 DIAGNOSIS — I5032 Chronic diastolic (congestive) heart failure: Secondary | ICD-10-CM | POA: Diagnosis present

## 2016-01-04 DIAGNOSIS — R001 Bradycardia, unspecified: Secondary | ICD-10-CM | POA: Diagnosis present

## 2016-01-04 DIAGNOSIS — D62 Acute posthemorrhagic anemia: Secondary | ICD-10-CM | POA: Diagnosis present

## 2016-01-04 DIAGNOSIS — Z7901 Long term (current) use of anticoagulants: Secondary | ICD-10-CM | POA: Diagnosis not present

## 2016-01-04 DIAGNOSIS — K5900 Constipation, unspecified: Secondary | ICD-10-CM | POA: Diagnosis not present

## 2016-01-04 DIAGNOSIS — Z79899 Other long term (current) drug therapy: Secondary | ICD-10-CM

## 2016-01-04 DIAGNOSIS — I350 Nonrheumatic aortic (valve) stenosis: Secondary | ICD-10-CM | POA: Diagnosis present

## 2016-01-04 DIAGNOSIS — M10071 Idiopathic gout, right ankle and foot: Secondary | ICD-10-CM | POA: Diagnosis not present

## 2016-01-04 DIAGNOSIS — K254 Chronic or unspecified gastric ulcer with hemorrhage: Secondary | ICD-10-CM | POA: Diagnosis present

## 2016-01-04 DIAGNOSIS — K259 Gastric ulcer, unspecified as acute or chronic, without hemorrhage or perforation: Secondary | ICD-10-CM | POA: Diagnosis not present

## 2016-01-04 DIAGNOSIS — M1711 Unilateral primary osteoarthritis, right knee: Secondary | ICD-10-CM | POA: Diagnosis present

## 2016-01-04 DIAGNOSIS — D696 Thrombocytopenia, unspecified: Secondary | ICD-10-CM | POA: Diagnosis present

## 2016-01-04 DIAGNOSIS — J439 Emphysema, unspecified: Secondary | ICD-10-CM | POA: Diagnosis present

## 2016-01-04 DIAGNOSIS — Z9889 Other specified postprocedural states: Secondary | ICD-10-CM | POA: Diagnosis not present

## 2016-01-04 DIAGNOSIS — K449 Diaphragmatic hernia without obstruction or gangrene: Secondary | ICD-10-CM | POA: Diagnosis present

## 2016-01-04 DIAGNOSIS — F17290 Nicotine dependence, other tobacco product, uncomplicated: Secondary | ICD-10-CM

## 2016-01-04 DIAGNOSIS — M1710 Unilateral primary osteoarthritis, unspecified knee: Secondary | ICD-10-CM

## 2016-01-04 DIAGNOSIS — Z791 Long term (current) use of non-steroidal anti-inflammatories (NSAID): Secondary | ICD-10-CM

## 2016-01-04 DIAGNOSIS — Z800 Family history of malignant neoplasm of digestive organs: Secondary | ICD-10-CM

## 2016-01-04 DIAGNOSIS — Z8249 Family history of ischemic heart disease and other diseases of the circulatory system: Secondary | ICD-10-CM

## 2016-01-04 LAB — BASIC METABOLIC PANEL
ANION GAP: 5 (ref 5–15)
BUN: 77 mg/dL — ABNORMAL HIGH (ref 6–20)
CALCIUM: 8.6 mg/dL — AB (ref 8.9–10.3)
CO2: 22 mmol/L (ref 22–32)
Chloride: 113 mmol/L — ABNORMAL HIGH (ref 101–111)
Creatinine, Ser: 1.17 mg/dL (ref 0.61–1.24)
GFR, EST NON AFRICAN AMERICAN: 54 mL/min — AB (ref >60)
GLUCOSE: 110 mg/dL — AB (ref 65–99)
POTASSIUM: 3.9 mmol/L (ref 3.5–5.1)
SODIUM: 140 mmol/L (ref 135–145)

## 2016-01-04 LAB — PROTIME-INR
INR: 3.8
PROTHROMBIN TIME: 38.4 s — AB (ref 11.4–15.2)

## 2016-01-04 LAB — VITAMIN B12: Vitamin B-12: 895 pg/mL (ref 180–914)

## 2016-01-04 LAB — CBC
HEMATOCRIT: 19.4 % — AB (ref 39.0–52.0)
HEMOGLOBIN: 6.7 g/dL — AB (ref 13.0–17.0)
MCH: 34.7 pg — ABNORMAL HIGH (ref 26.0–34.0)
MCHC: 34.5 g/dL (ref 30.0–36.0)
MCV: 100.5 fL — ABNORMAL HIGH (ref 78.0–100.0)
Platelets: 84 10*3/uL — ABNORMAL LOW (ref 150–400)
RBC: 1.93 MIL/uL — AB (ref 4.22–5.81)
RDW: 14.5 % (ref 11.5–15.5)
WBC: 4.5 10*3/uL (ref 4.0–10.5)

## 2016-01-04 LAB — PREPARE RBC (CROSSMATCH)

## 2016-01-04 MED ORDER — SODIUM CHLORIDE 0.9 % IV SOLN
Freq: Once | INTRAVENOUS | Status: AC
  Administered 2016-01-05: 09:00:00 via INTRAVENOUS

## 2016-01-04 MED ORDER — DEXTROSE-NACL 5-0.45 % IV SOLN
INTRAVENOUS | Status: AC
  Administered 2016-01-04: 11:00:00 via INTRAVENOUS

## 2016-01-04 MED ORDER — VITAMIN K1 10 MG/ML IJ SOLN
5.0000 mg | Freq: Once | INTRAVENOUS | Status: AC
  Administered 2016-01-04: 5 mg via INTRAVENOUS
  Filled 2016-01-04: qty 0.5

## 2016-01-04 NOTE — Progress Notes (Signed)
[facility] Gastroenterology Progress Note  [name] 82 y.o. 1927/02/10  CC:   GI bleed   Subjective: Patient's hemoglobin dropped to 6.7. INR up to 3.8. No bowel movement since 5 PM yesterday. Discussed with wife.   ROS : Negative for abdominal pain, nausea, vomiting   Objective: Vital signs in last 24 hours: Vitals:   01/03/16 2147 01/04/16 0518  BP: (!) 141/58 (!) 113/46  Pulse: 70 66  Resp: 20 20  Temp: 97.9 F (36.6 C) 97.5 F (36.4 C)    Physical Exam:  General:   Alert,  Somewhat cooperative. NAD HEENT : NS, AT  Lungs:  Clear throughout to auscultation.   No wheezes, crackles, or rhonchi. No acute distress. Heart:  Regular rate and rhythm; no murmurs, clicks, rubs,  or gallops.Scar from previous surgery noted.  Abdomen:  Soft, NT, ND , BS +  LE : trace edema     Lab Results:  Recent Labs  01/03/16 1235 01/04/16 0505  NA 140 140  K 4.5 3.9  CL 113* 113*  CO2 20* 22  GLUCOSE 129* 110*  BUN 83* 77*  CREATININE 1.24 1.17  CALCIUM 9.1 8.6*    Recent Labs  01/03/16 1235  AST 19  ALT 13*  ALKPHOS 41  BILITOT 0.7  PROT 5.1*  ALBUMIN 3.4*    Recent Labs  01/03/16 1235 01/04/16 0505  WBC 8.8 4.5  NEUTROABS 7.5  --   HGB 8.9* 6.7*  HCT 25.9* 19.4*  MCV 102.0* 100.5*  PLT 108* 84*    Recent Labs  01/03/16 1235 01/04/16 0505  LABPROT 32.6* 38.4*  INR 3.09 3.80      Assessment/Plan: Melena in setting of Coumadin, aspirin and NSAID use. Need to rule out ulcer disease. Atrial fibrillation. On Coumadin.  History of prosthetic aortic wall. Tissue prosthesis. Coronary artery disease status post CABG History of AAA repair - History of right hemicolectomy for large tubovillous adenoma at hepatic flexure in 2011   Recommendations ------------------------ - Patient's INR is 3.8. Vitamin down to 6.7. Primary team is planning for PRBC  -  Vit K has been ordered. No bowel movement since yesterday 5 PM. - Monitor H&H and  INR. Protonix  Drip .    - Patient will benefit from endoscopic evaluation with EGD . Risk and benefits of the procedure discussed with the patient and the wife. - INR needs to be < 2 prior to procedure to achieve adequate hemostasis if there is evidence of active bleeding during EGD  - Patient with no active cardiac issues at this time. - Okay to have clear liquid diet today. Nothing by mouth past midnight. - Avoid NSAIDs. - GI will follow.     Otis Brace MD, FACP 01/04/2016, 8:16 AM  Pager [phone]  If no answer or after 5 PM call [phone]

## 2016-01-04 NOTE — NC FL2 (Signed)
[place] MEDICAID FL2 LEVEL OF CARE SCREENING TOOL     IDENTIFICATION  Patient Name: [name] Birthdate: Jan 16, 1927 Sex: male Admission Date (Current Location): 01/03/2016  [facility] and [place] Number:  Herbalist and Address:  [facility], [place] [address], [place], [place] [address]      Provider Number: [id number]  Attending Physician Name and Address:  Annia Belt, MD  Relative Name and Phone Number:  Lenell Antu, spouse, [phone]    Current Level of Care: Hospital Recommended Level of Care: [place] Prior Approval Number:    Date Approved/Denied:   PASRR Number: [id number]  Discharge Plan: SNF    Current Diagnoses: Patient Active Problem List   Diagnosis Date Noted  . CKD stage G2/A2, GFR 60-89 and albumin creatinine ratio 30-299 mg/g   . GI bleed 01/03/2016  . Chronic systolic heart failure ([place]) 11/10/2014  . Chronic anticoagulation 09/14/2014  . Acute on chronic systolic CHF (congestive heart failure) ([place]) 09/09/2014  . Essential hypertension   . Shortness of breath   . Hypertensive urgency 09/08/2014  . Epistaxis 01/15/2014  . AAA (abdominal aortic aneurysm) ([place]) 05/22/2012  . Occlusion and stenosis of carotid artery without mention of cerebral infarction 12/14/2011  . S/P AVR (aortic valve replacement) 07/31/2011  . Hypothyroidism 04/28/2011  . Abdominal pain 12/26/2010  . Dilated cardiomyopathy ([place]) 11/16/2010  . Coronary artery disease involving native coronary artery of native heart without angina pectoris   . Hypertension   . Hyperlipidemia   . Aortic valve disorders 11/08/2010  . Chronic atrial fibrillation ([place]) 10/31/2010  . On warfarin therapy 10/31/2010  . Inguinal hernia unilateral, non-recurrent 10/03/2010  . ATHEROSCLEROTIC CARDIOVASCULAR DISEASE 10/01/2008  . NONSPECIFIC ABN FINDING RAD & OTH EXAM GI TRACT 10/01/2008    Orientation RESPIRATION BLADDER Height & Weight      Self, Time, Situation, Place  Normal Continent Weight: 83.6 kg (184 lb 4.9 oz) Height:  5\' 10"  (177.8 cm)  BEHAVIORAL SYMPTOMS/MOOD NEUROLOGICAL BOWEL NUTRITION STATUS      Continent Diet (Please see DC Summary)  AMBULATORY STATUS COMMUNICATION OF NEEDS Skin   Limited Assist Verbally Normal                       Personal Care Assistance Level of Assistance  Bathing, Feeding, Dressing Bathing Assistance: Limited assistance Feeding assistance: Independent Dressing Assistance: Limited assistance     Functional Limitations Info             SPECIAL CARE FACTORS FREQUENCY  PT (By licensed PT)     PT Frequency: 5x/week              Contractures      Additional Factors Info  Code Status, Allergies Code Status Info: DNR Allergies Info: NKA           Current Medications (01/04/2016):  This is the current hospital active medication list Current Facility-Administered Medications  Medication Dose Route Frequency Provider Last Rate Last Dose  . 0.9 %  sodium chloride infusion   Intravenous Once Bethany Molt, DO      . acetaminophen (TYLENOL) tablet 650 mg  650 mg Oral Q6H PRN Milagros Loll, MD       Or  . acetaminophen (TYLENOL) suppository 650 mg  650 mg Rectal Q6H PRN Milagros Loll, MD      . dextrose 5 %-0.45 % sodium chloride infusion   Intravenous Continuous [name], MD 75 mL/hr  at 01/04/16 1129    . latanoprost (XALATAN) 0.005 % ophthalmic solution 1 drop  1 drop Both Eyes QHS Milagros Loll, MD   1 drop at 01/03/16 2211  . levothyroxine (SYNTHROID, LEVOTHROID) tablet 25 mcg  25 mcg Oral QAC breakfast Milagros Loll, MD   25 mcg at 01/04/16 0825  . pantoprazole (PROTONIX) 80 mg in sodium chloride 0.9 % 250 mL (0.32 mg/mL) infusion  8 mg/hr Intravenous Continuous Parag Brahmbhatt, MD 25 mL/hr at 01/03/16 1719 8 mg/hr at 01/03/16 1719  . [START ON 01/07/2016] pantoprazole (PROTONIX) injection 40 mg  40 mg Intravenous Q12H Parag Brahmbhatt, MD       . sodium chloride flush (NS) 0.9 % injection 3 mL  3 mL Intravenous Q12H Milagros Loll, MD   3 mL at 01/04/16 0913     Discharge Medications: Please see discharge summary for a list of discharge medications.  Relevant Imaging Results:  Relevant Lab Results:   Additional Information SSN: Fenton [place], [place]

## 2016-01-04 NOTE — Care Management Obs Status (Signed)
[place] NOTIFICATION   Patient Details  Name: [name] MRN: [id number] Date of Birth: 1926-05-12   Medicare Observation Status Notification Given:  Yes    Carles Collet, RN 01/04/2016, 3:40 PM

## 2016-01-04 NOTE — Progress Notes (Signed)
Subjective: Patient reports no new symptoms and only requests water. Patient understood plan to receive blood, reversal agents and wait for INR to reach optimum levels. Patient's last bowel movement was 5pm yesterday.  Objective: Vital signs in last 24 hours: Vitals:   01/03/16 1511 01/03/16 1641 01/03/16 2147 01/04/16 0518  BP:  (!) 135/54 (!) 141/58 (!) 113/46  Pulse:  (!) 58 70 66  Resp:  20 20 20   Temp: 97.7 F (36.5 C) 98.8 F (37.1 C) 97.9 F (36.6 C) 97.5 F (36.4 C)  TempSrc: Oral Oral Oral Oral  SpO2:  100% 100% 100%  Weight:  82.5 kg (181 lb 14.1 oz)  83.6 kg (184 lb 4.9 oz)  Height:  5\' 10"  (1.778 m)     Weight change: +2 kg  Intake/Output Summary (Last 24 hours) at 01/04/16 [id number] Last data filed at 01/04/16 [id number]  Gross per 24 hour  Intake            890.5 ml  Output              220 ml  Net            670.5 ml   General appearance: alert, cooperative, appears stated age, no distress and pale HEENT: normocephalic and atruamatic PERRL. MMM. Anicteric. No oral lesions present.  Cv: Sternal scar present. Irregular rate and rhythm. Nl s1 s2. Early peaking III/VI Systolic murmur radiating to both carotids loudest in upper sternal borders. Distal pulses 2+. Warm extremities. No LE edema. Resp: No appearent scars on back. NWB. CTAB.  Abdomen: NBS. Slightly more distended and tender at epigastric. No rebound.   Rectal: Deferred 2/2 to ED eval Neurologic: Facial movements symmetric. No dysarthria. Uvula and tongue midline. EOM intact. No dysphagia or dysphonia.   Lab Results: CBC was notable for a H/H of 6.7/19.4 and platelets of 84 from an H/H of 8.9/25.9 and pt of 108. BMP was notable for a BUN at 77 from 83 yesterday. INR was 3.8 from 3.09. B12 was wnl.   Micro Results: No results found for this or any previous visit (from the past 240 hour(s)). Studies/Results: Dg Chest Portable 1 View  Result Date: 01/03/2016 CLINICAL DATA:  Syncope EXAM: PORTABLE CHEST 1 VIEW  COMPARISON:  09/07/2014 FINDINGS: Normal heart size and mediastinal contours after CABG and aortic valve replacement. There is no edema, consolidation, effusion, or pneumothorax. No acute osseous finding. EKG leads create artifact over the chest. IMPRESSION: Stable.  No evidence of acute disease. Electronically Signed   By: Monte Fantasia M.D.   On: 01/03/2016 13:57   Medications: I have reviewed the patient's current medications. Scheduled Meds: . sodium chloride   Intravenous Once  . latanoprost  1 drop Both Eyes QHS  . levothyroxine  25 mcg Oral QAC breakfast  . [START ON 01/07/2016] pantoprazole  40 mg Intravenous Q12H  . phytonadione (VITAMIN K) IV  5 mg Intravenous Once  . sodium chloride flush  3 mL Intravenous Q12H   Continuous Infusions: . sodium chloride 20 mL/hr at 01/04/16 0535  . pantoprozole (PROTONIX) infusion 8 mg/hr (01/03/16 1719)   PRN Meds:.acetaminophen **OR** acetaminophen Assessment/Plan: Principal Problem:   GI bleed  [name], [name] is a 82 yo M with a significant PMH of atrial fibrillation currently on warfarin, h/o stroke, Aortic Stenosis s/p tissue AVR [id number], Diastolic HFpEF, CAD s/p CAPG 5 vessel 2000, AAA s/p endograft who presented to EMS with syncope 2/2 to GI bleed.   Upper GI Bleed The patient's  three day history of black stools, weakness, orthostatic syncope, everyday use of Aleve and warfarin most likely points to a upper GI bleed. The decrease in H/H from 12.7/35.6>8.9/25.9, positive fecal occult blood, elevated BUN, and pallor on exam is suggestive of this as well. Others to include are an arrhythmia, valsalva induced syncope, AAA repair fistula, an RCA infarct leading to a sinus sick syndrome, seizure, and diverticulosis.    O/N patient H/H dropped from 8.9/25.8 to 6.7/19.4. Last BM was 5pm yesterday.   - s/p 2 units pRBCs  - s/p 2 units of FFP  - PT/INR after FFP   - Pantoprazole 8 mg/hr  - Stop Warfarin  - Stop NSAIDS  - Stop ASA 81 mg    - tele  - vitals q4  - Monitor stools for melena/hematochezia   - cbc qd  - PT/INR qd  - BMP qd  - Per GI, INR<2 for upper EGD  - Clear liquids until midnight  - NPO at midnight   Hypovolemia  Although the exam showed MMM, his recent increase to lasix 40mg  qd may have contributed his orthostatic syncope as suggested by hyaline casts in urine sed and elevated BUN/Cr ratio>20.    - hold furosemide 20 mg PRN 2/2 to soft pressures and hypovolemia  - mIVF 75 ml/hr D5 1/2NS   Atrial fibrillation Patient is in a fib presently but bradycardic. CHADSVASC score of 7.   - tele as above  - Stop rate control carvedilol 6.25 qd 2/2 to bradycardia and soft pressures  - Monitor for RVR with 5 mg IV metoprolol PRN  - Stop Warfarin as above  Diastolic HFpEF No signs of volume overload. Most recent echo 08/2014 could not evaluate diastolic function  - Hold furosemide as above  - hold losartan 25 mg qd 2/2 to soft pressures  - ECHO to revaulate  - Strict I&Os  - Hold ASA 81 mg   HTN Patient has been in soft pressures since arrival. Will continue to monitor.  - Hold Carvedilol as above  - Hold furosemide as above  - hold losartan as above  Hypothyroidism  - TSH of 3.576  - continue levothyroxine 20mcg qd  Right Knee Arthritis  - Hold aleve   FEN/GI  - NPO  - DVT prophylaxis: SCDs 2/2 to bleed  Access  - pIV x2  Resuscitation  - DNR  This is a Careers information officer Note.  The care of the patient was discussed with Dr. Wynetta Emery and the assessment and plan formulated with their assistance.  Please see their attached note for official documentation of the daily encounter.   LOS: 0 days   Benn Moulder, Medical Student 01/04/2016, 8:03 AM

## 2016-01-04 NOTE — Progress Notes (Signed)
PT Cancellation Note  Patient Details Name: [name] MRN: [id number] DOB: [birth year]   Cancelled Treatment:    Reason Eval/Treat Not Completed: Patient not medically ready. RN asked to hold until PM as pt receiving blood and plasma. PT to return as able.   [name] 01/04/2016, 11:50 AM   Kittie Plater, PT, DPT Pager #: [phone] Office #: [phone]

## 2016-01-04 NOTE — Clinical Social Work Note (Signed)
Clinical Social Work Assessment  Patient Details  Name: [name] MRN: [id number] Date of Birth: February 10, 1927  Date of referral:  01/04/16               Reason for consult:  Facility Placement                Permission sought to share information with:  Facility Sport and exercise psychologist, Family Supports Permission granted to share information::  Yes, Verbal Permission Granted  Name::     Conservation officer, nature::  SNFs  Relationship::  Spouse  Contact Information:  [phone]  Housing/Transportation Living arrangements for the past 2 months:  Single Family Home Source of Information:  Patient, Adult Children, Spouse Patient Interpreter Needed:  None Criminal Activity/Legal Involvement Pertinent to Current Situation/Hospitalization:  No - Comment as needed Significant Relationships:  Adult Children, Spouse Lives with:  Spouse Do you feel safe going back to the place where you live?  No Need for family participation in patient care:  Yes (Comment)  Care giving concerns:  CSW received consult for possible SNF placement at time of discharge. CSW met with patient and patient's daughter at bedside regarding PT recommendation of SNF placement at time of discharge. Per patient's daughter, patient's spouse is currently unable to care for patient at their home given patient's current physical needs and fall risk. Patient's daughter states that patient normally is stronger and can walk further. Patient and patient's daughter expressed understanding of PT recommendation and are agreeable to SNF placement at time of discharge. CSW to continue to follow and assist with discharge planning needs.   Social Worker assessment / plan:  CSW spoke with patient and patient's daughter concerning possibility of rehab at [facility] before returning home.  Employment status:  Retired Nurse, adult PT Recommendations:  [place] / Referral to community resources:   [place]  Patient/Family's Response to care:  Patient and patient's daughter recognize need for rehab before returning home and are agreeable to a SNF in [place]. Patient reported preference for [facility] or [facility]. Patient's daughter reported understanding that patient's insurance may not be in network with certain facilities.   Patient/Family's Understanding of and Emotional Response to Diagnosis, Current Treatment, and Prognosis:  Patient/family is realistic regarding therapy needs and expressed being hopeful for SNF placement. Patient expressed understanding of CSW role and discharge process. No questions/concerns about plan or treatment.    Emotional Assessment Appearance:  Appears stated age Attitude/Demeanor/Rapport:  Other (Appropriate) Affect (typically observed):  Accepting, Appropriate, Pleasant Orientation:  Oriented to Self, Oriented to Place, Oriented to  Time, Oriented to Situation Alcohol / Substance use:  Not Applicable Psych involvement (Current and /or in the community):  No (Comment)  Discharge Needs  Concerns to be addressed:  Care Coordination Readmission within the last 30 days:  No Current discharge risk:  None Barriers to Discharge:  Continued Medical Work up   [employer], [place] 01/04/2016, 4:04 PM

## 2016-01-04 NOTE — Progress Notes (Addendum)
Subjective: [name] reports restless sleep last night and that he is very thirst this morning and frustrated with being NPO. He denies having any further melanotic stools. His morning Hb 6.7 and his INR increased to 3.8 so the planned EGD is being differed pending anticoagulation reversal to INR < 2. We plan to replete vitamin K and transfuse blood and FFP.  Review of telemetry overnight revealed numerous PVCs and several episodes of transient bradycardia to HR 30s-40s.  Objective: Vital signs in last 24 hours: Vitals:   01/03/16 1511 01/03/16 1641 01/03/16 2147 01/04/16 0518  BP:  (!) 135/54 (!) 141/58 (!) 113/46  Pulse:  (!) 58 70 66  Resp:  20 20 20   Temp: 97.7 F (36.5 C) 98.8 F (37.1 C) 97.9 F (36.6 C) 97.5 F (36.4 C)  TempSrc: Oral Oral Oral Oral  SpO2:  100% 100% 100%  Weight:  82.5 kg (181 lb 14.1 oz)  83.6 kg (184 lb 4.9 oz)  Height:  5\' 10"  (1.778 m)      Intake/Output Summary (Last 24 hours) at 01/04/16 0630 Last data filed at 01/04/16 [id number]  Gross per 24 hour  Intake            890.5 ml  Output              220 ml  Net            670.5 ml    Physical Exam General appearance: Elderly gentleman resting comfortably in bed, in no distress, conversational, pale HENT: Normocephalic, atraumatic moist mucous membranes, neck supple, no JVD Cardiovascular: Irregular rhythm with normal rate, soft ejection murmur, no rubs, gallops Respiratory: Clear to auscultation bilaterally, normal work of breathing Abdomen: BS+, soft, mild generalized tenderness to palpation, non-distended Extremities: Normal bulk and range of motion, trace pitting edema to mid calves bilaterally, 2+ peripheral pulses Skin: Warm, dry, intact, scattered solar lentigo Neuro: Alert and oriented Psych: Appropriate affect, clear speech, thoughts linear and goal-directed  Labs / Imaging / Procedures: CBC Latest Ref Rng & Units 01/04/2016 01/03/2016 06/29/2015  WBC 4.0 - 10.5 K/uL 4.5 8.8 3.7(L)    Hemoglobin 13.0 - 17.0 g/dL 6.7(LL) 8.9(L) 12.7(L)  Hematocrit 39.0 - 52.0 % 19.4(L) 25.9(L) 35.6(L)  Platelets 150 - 400 K/uL 84(L) 108(L) 123(L)   BMP Latest Ref Rng & Units 01/04/2016 01/03/2016 06/29/2015  Glucose 65 - 99 mg/dL 110(H) 129(H) 80  BUN 6 - 20 mg/dL 77(H) 83(H) 13  Creatinine 0.61 - 1.24 mg/dL 1.17 1.24 0.99  Sodium 135 - 145 mmol/L 140 140 133(L)  Potassium 3.5 - 5.1 mmol/L 3.9 4.5 4.1  Chloride 101 - 111 mmol/L 113(H) 113(H) 100(L)  CO2 22 - 32 mmol/L 22 20(L) 25  Calcium 8.9 - 10.3 mg/dL 8.6(L) 9.1 9.6   Dg Chest Portable 1 View  Result Date: 01/03/2016 CLINICAL DATA:  Syncope EXAM: PORTABLE CHEST 1 VIEW COMPARISON:  09/07/2014 FINDINGS: Normal heart size and mediastinal contours after CABG and aortic valve replacement. There is no edema, consolidation, effusion, or pneumothorax. No acute osseous finding. EKG leads create artifact over the chest. IMPRESSION: Stable.  No evidence of acute disease. Electronically Signed   By: Monte Fantasia M.D.   On: 01/03/2016 13:57    Assessment/Plan: [name] is a 82 y.o. gentleman with PMH AAA s/p repair 2010/2012, IDA, AS s/p bioprosthetic AVR on Coumadin, chronic Afib, HFpEF (LVEF 55-60% in 08/2014), CAD s/p CABGx5 in 2000, emphysema, gout, carotid stenosis, HTN, HLD, CVA x3, and  DJD admitted for syncope and melena.  Active Problems:  GI bleed with acute blood loss anemia, 2-3 days of melena at home, on Warfarin for atrial fibrillation, endorses daily NSAID use, ongoing tobacco and alcohol use. Hemoglobin 8.9 from 12.7 in May 2017. Hemodynamically stable but melanotic stool visible on ED rectal exam and FOBT+. Colonoscopy 5 years ago but has never had EGD, concern for UGIB from PUD or malignancy. - Appreciate GI recs, defer EGD until INR<2 - AM CBC - Hb 6.7, transfuse Hb < 8 given significant CVD - transfuse 2 units pRBCs - AM INR - 3.8, goal INR <2 for EGD - administer vitamin K 5 mg IV and transfuse 2 units FFP -  Follow CBC and INR s/p transfusions today - Protonix 40mg  BID - Hold home Warfarin, Asprin - Clear liquid diet until MN - Avoid NSAIDs  Syncope, may be secondary to hypovolemia with recent increased diuresis, bradycardia (BB dose), arrhythmia, worsening CHF, symptom improvement following IVF resusc via EMS - Telemetry - Follow TTE  - mIVF 75 cc D5 1/2 NS - PT eval and treat to assess functional status/symptom recurrence  HFpEF, (LVEF 55-60% in 08/2014 with severe LAE and moderate RAE) - Update TTE - Strict I/Os and daily weights - Hold home lasix 20 QD PRN given current concern for overdiuresis - Holding home Coreg 6.25 mg daily - Holding home Losartan 25 mg daily - Holding home Aspirin 81mg   Atrial fibrillation, CHADSVASc score 7, typical INR goal 2-3, ongoing but appears rate-controlled. Intermittent episodes of transient bradycardia overnight concerning for some degree of heart block, may need evaluation for pacemaker or ICD in the future - Telemetry, monitor for RVR - Holding Warfarin and Coreg  Bioprosthetic aortic valve, on chronic Warfarin - Hold Warfarin given GIB  HTN, currently with soft blood pressures - holding home Coreg and Losartan  Hypothyroidism, TSH of 3.576 - Continue levothyroxine 14mcg qd  Knee osteoarthritis, MSK - Tylenol PRN - Avoid NSAIDs  FEN/GI: Clear liquid diet, NPO at midnight, replete electrolytes as needed  DVT ppx: SCDs  Dispo: Anticipated discharge in approximately 2-3 day(s).    LOS: 0 days   [name], MD 01/04/2016, 6:30 AM Pager: [phone]

## 2016-01-04 NOTE — Evaluation (Signed)
Physical Therapy Evaluation Patient Details Name: [name] MRN: [id number] DOB: November 26, 1926 Today's Date: 01/04/2016   History of Present Illness  [name], [name] is a 82 yo M with a significant PMH of atrial fibrillation, currently on warfarin, Aortic Stenosis s/p tissue AVR 10/2010, HFpEF (echo 08/2014), CAD s/p CABG 5 vessel 2000, AAA s/p endograft who presented to the EMS with syncope. The patient states he became very weak when he tried to come back into his house after finishing his beer and cigar on the porch. Pt found to have a GI bleed  Clinical Impression  Pt very deconditioned. Pt was amb with RW PTA and required min guard for bathing. Pt now requires modA for transfers and ambulation, but limited to 6' due to fatigue. Pt to recommend ST-SNF to address mentioned deficits to achieve safe supervision level of function for transition home.    Follow Up Recommendations SNF;Supervision/Assistance - 24 hour    Equipment Recommendations  None recommended by PT    Recommendations for Other Services       Precautions / Restrictions Precautions Precautions: Fall Precaution Comments: freq falls Restrictions Weight Bearing Restrictions: No      Mobility  Bed Mobility Overal bed mobility: Needs Assistance Bed Mobility: Supine to Sit     Supine to sit: Min assist     General bed mobility comments: hob elevated to 80 deg, assist for LE management off EOB  Transfers Overall transfer level: Needs assistance Equipment used: Rolling walker (2 wheeled) Transfers: Sit to/from [facility] Sit to Stand: Mod assist Stand pivot transfers: Mod assist;+2 safety/equipment       General transfer comment: required increased time, labored effort, modA from chair height, minA from bed height  Ambulation/Gait Ambulation/Gait assistance: Mod assist;+2 safety/equipment Ambulation Distance (Feet): 5 Feet Assistive device: Rolling walker (2 wheeled) Gait  Pattern/deviations: Step-through pattern;Decreased stride length;Trunk flexed;Wide base of support Gait velocity: slow Gait velocity interpretation: Below normal speed for age/gender General Gait Details: labored "I feel exhausted"   Stairs            Wheelchair Mobility    Modified Rankin (Stroke Patients Only)       Balance Overall balance assessment: Needs assistance Sitting-balance support: Feet supported;Bilateral upper extremity supported Sitting balance-Leahy Scale: Fair     Standing balance support: Bilateral upper extremity supported Standing balance-Leahy Scale: Good Standing balance comment: patient requires RW for safe standing                             Pertinent Vitals/Pain Pain Assessment: 0-10 Pain Score: 5  Pain Location: generalized weakness and soreness from fall Pain Descriptors / Indicators: Sore    Home Living Family/patient expects to be discharged to:: Private residence Living Arrangements: Spouse/significant other Available Help at Discharge: Family;Available 24 hours/day Type of Home: House Home Access: Ramped entrance     Home Layout: One level Home Equipment: Walker - 2 wheels      Prior Function Level of Independence: Needs assistance   Gait / Transfers Assistance Needed: rolling walker, short community  ADL's / Homemaking Assistance Needed: wife assist with dressing bathing and cooking        Hand Dominance   Dominant Hand: Right    Extremity/Trunk Assessment   Upper Extremity Assessment: Generalized weakness           Lower Extremity Assessment: Generalized weakness      Cervical / Trunk Assessment: Normal  Communication  Communication: No difficulties  Cognition Arousal/Alertness: Awake/alert Behavior During Therapy: WFL for tasks assessed/performed Overall Cognitive Status: Within Functional Limits for tasks assessed                      General Comments General comments (skin  integrity, edema, etc.): pt with multiple bruising t/o body. pt condom cath came off, pt soiled in urine, pt unable to tell he was wet. pt dependent for hygiene.    Exercises     Assessment/Plan    PT Assessment Patient needs continued PT services  PT Problem List Decreased strength;Decreased range of motion;Decreased activity tolerance;Decreased balance;Decreased mobility          PT Treatment Interventions DME instruction;Gait training;Stair training;Functional mobility training;Therapeutic activities;Therapeutic exercise;Balance training    PT Goals (Current goals can be found in the Care Plan section)  Acute Rehab PT Goals Patient Stated Goal: get better PT Goal Formulation: With patient Time For Goal Achievement: 01/11/16 Potential to Achieve Goals: Fair    Frequency Min 3X/week   Barriers to discharge Decreased caregiver support eldery spouse can only provide supervision/min guard    Co-evaluation               End of Session Equipment Utilized During Treatment: Gait belt Activity Tolerance: Patient limited by fatigue Patient left: in chair;with call bell/phone within reach;with chair alarm set;with family/visitor present Nurse Communication: Mobility status    Functional Assessment Tool Used: clinical judgment Functional Limitation: Mobility: Walking and moving around Mobility: Walking and Moving Around Current Status [phone]): At least 40 percent but less than 60 percent impaired, limited or restricted Mobility: Walking and Moving Around Goal Status [phone]): At least 1 percent but less than 20 percent impaired, limited or restricted    Time: 1440-1528 PT Time Calculation (min) (ACUTE ONLY): 48 min   Charges:   PT Evaluation $PT Eval Moderate Complexity: 1 Procedure PT Treatments $Gait Training: 8-22 mins $Therapeutic Activity: 8-22 mins   PT G Codes:   PT G-Codes **NOT FOR INPATIENT CLASS** Functional Assessment Tool Used: clinical  judgment Functional Limitation: Mobility: Walking and moving around Mobility: Walking and Moving Around Current Status [id number]): At least 40 percent but less than 60 percent impaired, limited or restricted Mobility: Walking and Moving Around Goal Status [phone]): At least 1 percent but less than 20 percent impaired, limited or restricted    Berline Lopes 01/04/2016, 4:02 PM   Kittie Plater, PT, DPT Pager #: [phone] Office #: [phone]

## 2016-01-05 ENCOUNTER — Inpatient Hospital Stay (HOSPITAL_COMMUNITY): Payer: Medicare HMO | Admitting: Certified Registered Nurse Anesthetist

## 2016-01-05 ENCOUNTER — Encounter (HOSPITAL_COMMUNITY): Payer: Self-pay | Admitting: *Deleted

## 2016-01-05 ENCOUNTER — Inpatient Hospital Stay (HOSPITAL_COMMUNITY): Payer: Medicare HMO

## 2016-01-05 ENCOUNTER — Encounter (HOSPITAL_COMMUNITY)
Admission: EM | Disposition: A | Discharge status: Home / Self Care | Payer: Self-pay | Source: Home / Self Care | Attending: Oncology

## 2016-01-05 ENCOUNTER — Telehealth: Payer: Self-pay | Admitting: Cardiology

## 2016-01-05 DIAGNOSIS — Z951 Presence of aortocoronary bypass graft: Secondary | ICD-10-CM

## 2016-01-05 DIAGNOSIS — Z9889 Other specified postprocedural states: Secondary | ICD-10-CM

## 2016-01-05 DIAGNOSIS — K259 Gastric ulcer, unspecified as acute or chronic, without hemorrhage or perforation: Secondary | ICD-10-CM

## 2016-01-05 DIAGNOSIS — R5381 Other malaise: Secondary | ICD-10-CM

## 2016-01-05 DIAGNOSIS — Z8673 Personal history of transient ischemic attack (TIA), and cerebral infarction without residual deficits: Secondary | ICD-10-CM

## 2016-01-05 DIAGNOSIS — Z9181 History of falling: Secondary | ICD-10-CM

## 2016-01-05 DIAGNOSIS — Z8679 Personal history of other diseases of the circulatory system: Secondary | ICD-10-CM

## 2016-01-05 HISTORY — PX: ESOPHAGOGASTRODUODENOSCOPY: SHX5428

## 2016-01-05 LAB — TYPE AND SCREEN
ABO/RH(D): O POS
Antibody Screen: POSITIVE
DAT, IGG: NEGATIVE
DONOR AG TYPE: NEGATIVE
Donor AG Type: NEGATIVE
UNIT DIVISION: 0
Unit division: 0

## 2016-01-05 LAB — CBC
HCT: 24.5 % — ABNORMAL LOW (ref 39.0–52.0)
HEMATOCRIT: 23.7 % — AB (ref 39.0–52.0)
HEMOGLOBIN: 8.3 g/dL — AB (ref 13.0–17.0)
HEMOGLOBIN: 8.4 g/dL — AB (ref 13.0–17.0)
MCH: 33.3 pg (ref 26.0–34.0)
MCH: 33.5 pg (ref 26.0–34.0)
MCHC: 35 g/dL (ref 30.0–36.0)
MCHC: 34.3 g/dL (ref 30.0–36.0)
MCV: 95.2 fL (ref 78.0–100.0)
MCV: 97.6 fL (ref 78.0–100.0)
Platelets: 75 10*3/uL — ABNORMAL LOW (ref 150–400)
Platelets: 75 10*3/uL — ABNORMAL LOW (ref 150–400)
RBC: 2.49 MIL/uL — ABNORMAL LOW (ref 4.22–5.81)
RBC: 2.51 MIL/uL — ABNORMAL LOW (ref 4.22–5.81)
RDW: 16.8 % — ABNORMAL HIGH (ref 11.5–15.5)
RDW: 17.5 % — ABNORMAL HIGH (ref 11.5–15.5)
WBC: 5.6 10*3/uL (ref 4.0–10.5)
WBC: 5.6 10*3/uL (ref 4.0–10.5)

## 2016-01-05 LAB — PROTIME-INR
INR: 1.61
INR: 1.35
PROTHROMBIN TIME: 16.8 s — AB (ref 11.4–15.2)
Prothrombin Time: 19.3 seconds — ABNORMAL HIGH (ref 11.4–15.2)

## 2016-01-05 LAB — PREPARE FRESH FROZEN PLASMA
Unit division: 0
Unit division: 0

## 2016-01-05 SURGERY — EGD (ESOPHAGOGASTRODUODENOSCOPY)
Anesthesia: Monitor Anesthesia Care

## 2016-01-05 MED ORDER — SODIUM CHLORIDE 0.9 % IV SOLN
INTRAVENOUS | Status: DC | PRN
  Administered 2016-01-05: 10:00:00 via INTRAVENOUS

## 2016-01-05 MED ORDER — CYANOCOBALAMIN 1000 MCG/ML IJ SOLN
1000.0000 ug | INTRAMUSCULAR | Status: DC
  Filled 2016-01-05: qty 1

## 2016-01-05 MED ORDER — PANTOPRAZOLE SODIUM 40 MG PO TBEC
40.0000 mg | DELAYED_RELEASE_TABLET | Freq: Every day | ORAL | Status: DC
  Administered 2016-01-05 – 2016-01-11 (×7): 40 mg via ORAL
  Filled 2016-01-05 (×7): qty 1

## 2016-01-05 MED ORDER — PROPOFOL 500 MG/50ML IV EMUL
INTRAVENOUS | Status: DC | PRN
  Administered 2016-01-05: 50 ug/kg/min via INTRAVENOUS

## 2016-01-05 MED ORDER — BUTAMBEN-TETRACAINE-BENZOCAINE 2-2-14 % EX AERO
INHALATION_SPRAY | CUTANEOUS | Status: DC | PRN
  Administered 2016-01-05: 2 via TOPICAL

## 2016-01-05 MED ORDER — PROPOFOL 10 MG/ML IV BOLUS
INTRAVENOUS | Status: DC | PRN
  Administered 2016-01-05 (×3): 10 mg via INTRAVENOUS

## 2016-01-05 MED ORDER — WARFARIN - PHARMACIST DOSING INPATIENT
Freq: Every day | Status: DC
  Administered 2016-01-08 – 2016-01-09 (×2)

## 2016-01-05 MED ORDER — WARFARIN SODIUM 5 MG PO TABS
10.0000 mg | ORAL_TABLET | Freq: Once | ORAL | Status: AC
  Administered 2016-01-05: 10 mg via ORAL
  Filled 2016-01-05: qty 2

## 2016-01-05 MED ORDER — LACTATED RINGERS IV SOLN
INTRAVENOUS | Status: DC
  Administered 2016-01-05 – 2016-01-10 (×2): via INTRAVENOUS

## 2016-01-05 NOTE — Progress Notes (Signed)
Medicine attending: I personally examined this patient today and I attest to the accuracy of the evaluation and management plan as recorded by resident physician Dr. [name]. Since Dr. Durenda Age note earlier this morning, the patient proceeded with upper endoscopy. Findings were shallow antral gastric ulcerations which were not bleeding. GI gave [place] the clearance to resume the patient's warfarin. Gen. exam is unremarkable. Lungs overall clear. Regular cardiac rhythm. Abdomen soft and nontender. Extremities no calf tenderness. Impression: #1. Self-limited upper GI bleed complication of Coumadin, aspirin, and naproxen. We will stop his aspirin and nonsteroidal. Resume warfarin at a slightly lower dose given INR 3.1 at time of admission. Coumadin is monitored through his cardiologist office. #2. Syncope Secondary to orthostatic hypotension from acute GI bleed. I have canceled the echocardiogram. #3. Valvular heart disease status post bioprosthetic aortic valve replacement #4. Coronary artery disease status post bypass grafting. #5. Cerebrovascular disease status post stroke #6. Peripheral vascular disease status post AAA repair #7. Chronic atrial fibrillation on chronic warfarin anticoagulation #8. Essential hypertension. Hypotensive on admission secondary to acute blood loss. Blood pressure now stabilizing. We will resume his ARB & beta blocker but hold his diuretic. #9. Deconditioning Wife and one of his daughters here in the room this afternoon. Elderly wife very concerned. He has been falling at home. In the hospital he was unable to take a few steps with physical therapy. She is unable to manage him physically at home. Our physical and occupational therapists recommend skilled nursing facility with 24 hour supervision and assistance. We will contact our care managers now to expedite short-term SNF placement.

## 2016-01-05 NOTE — Transfer of Care (Signed)
Immediate Anesthesia Transfer of Care Note  Patient: [name]  Procedure(s) Performed: Procedure(s): ESOPHAGOGASTRODUODENOSCOPY (EGD) (N/A)  Patient Location: Endoscopy Unit  Anesthesia Type:MAC  Level of Consciousness: awake, alert  and oriented  Airway & Oxygen Therapy: Patient Spontanous Breathing and Patient connected to nasal cannula oxygen  Post-op Assessment: Report given to RN, Post -op Vital signs reviewed and stable and Patient moving all extremities X 4  Post vital signs: Reviewed and stable  Last Vitals:  Vitals:   01/05/16 0850 01/05/16 1027  BP: (!) 157/50   Pulse: (!) 56 63  Resp: (!) 22 12  Temp: 36.5 C     Last Pain:  Vitals:   01/05/16 1027  TempSrc: Oral  PainSc:          Complications: No apparent anesthesia complications

## 2016-01-05 NOTE — Progress Notes (Signed)
ANTICOAGULATION CONSULT NOTE - Initial Consult  Pharmacy Consult for warfarin Indication: atrial fibrillation  No Known Allergies  Patient Measurements: Height: 5\' 10"  (177.8 cm) Weight: 184 lb 4.8 oz (83.6 kg) IBW/kg (Calculated) : 73  Vital Signs: Temp: 97.4 F (36.3 C) (11/22 1027) Temp Source: Oral (11/22 1027) BP: 117/46 (11/22 1035) Pulse Rate: 60 (11/22 1035)  Labs:  Recent Labs  01/03/16 1235 01/04/16 0505 01/05/16 0126  HGB 8.9* 6.7* 8.3*  HCT 25.9* 19.4* 23.7*  PLT 108* 84* 75*  LABPROT 32.6* 38.4* 19.3*  INR 3.09 3.80 1.61  CREATININE 1.24 1.17  --     Estimated Creatinine Clearance: 45.9 mL/min (by C-G formula based on SCr of 1.17 mg/dL).   Medical History: Past Medical History:  Diagnosis Date  . AAA (abdominal aortic aneurysm) ([place])    s/p stent graft in September 2009 & with attempted embolization/occlusion of vessels for a type 2 leak around the stent graft; Managed by Dr. Oneida Alar; last CT in 2012 showing the leak had closed.   . Anemia    on iron pills  . Aortic stenosis    s/p AVR in September 2012 per Dr. Cyndia Bent  . Arthritis   . Atrial fibrillation [facility])    s/p cardioversion October 2012  . CHF (congestive heart failure) ([place])    SEPT AND OCT 2012; follow up echo in October shows EF of 50%.   . Chronic anticoagulation   . Coronary artery disease    Remote CABG x 5 in 2000  . Emphysema of lung ([place])   . Gallstones    s/p cholecystectomy in Dec 2012  . GI bleeding 01/03/2016  . Gout   . High risk medication use    on amiodarone  . History of carotid stenosis    s/p L CEA in September 2006  . Hyperlipidemia   . Hypertension   . Inguinal hernia    RIH  . PAT (paroxysmal atrial tachycardia) ([place])   . PVC's (premature ventricular contractions)   . Stroke [facility]) 2006   three strokes -no residuals  . Urinary frequency    at night  . Wears glasses     Medications:  Prescriptions Prior to Admission  Medication Sig Dispense Refill  Last Dose  . aspirin EC 81 MG tablet Take 81 mg by mouth daily.   01/03/2016 at Unknown time  . carvedilol (COREG) 6.25 MG tablet Take 3.125 mg by mouth 2 (two) times daily with a meal.   01/02/2016 at 8a  . cyanocobalamin (,VITAMIN B-12,) 1000 MCG/ML injection Inject 1,000 mcg into the muscle every 30 (thirty) days.   Past Month at Unknown time  . dorzolamide-timolol (COSOPT) 22.3-6.8 MG/ML ophthalmic solution 1 drop 2 (two) times daily.   01/02/2016 at Unknown time  . doxylamine, Sleep, (UNISOM) 25 MG tablet Take 25 mg by mouth at bedtime.   01/02/2016 at Unknown time  . furosemide (LASIX) 20 MG tablet Take 1 tablet (20 mg total) by mouth daily as needed for edema (for edema and shortness of breath). (Patient taking differently: Take 40 mg by mouth daily as needed for edema (for edema and shortness of breath). For 3 days only started on Thursday-Saturday. Cont back to 20 mg after 3 day of 40 mg. ( per Md)) 30 tablet 1 01/02/2016 at Unknown time  . latanoprost (XALATAN) 0.005 % ophthalmic solution Place 1 drop into both eyes at bedtime.   01/02/2016 at Unknown time  . levothyroxine (SYNTHROID, LEVOTHROID) 25 MCG tablet TAKE ONE  TABLET BY MOUTH BEFORE BREAKFAST. MUST HAVE OFFICE VISIT BEFORE FURTHER REFILLS. 30 tablet 2 01/03/2016 at Unknown time  . losartan (COZAAR) 25 MG tablet Take 1 tablet (25 mg total) by mouth daily. 90 tablet 3 01/03/2016 at Unknown time  . Multiple Vitamin (MULTIVITAMIN) tablet Take 1 tablet by mouth 2 (two) times daily.    01/02/2016 at Unknown time  . Naproxen Sodium (ALEVE) 220 MG CAPS Take 1 capsule by mouth as needed.   01/02/2016 at Unknown time  . Oxymetazoline HCl (NASAL SPRAY) 0.05 % SOLN Place into the nose as needed.   Past Week at Unknown time  . warfarin (COUMADIN) 7.5 MG tablet Take 7.5-11.25 mg by mouth daily. 7.5 mg every day except on Monday patient takes 11.25 mg.( 1 and 1/2 tablet )   01/02/2016 at 6p  . clotrimazole ([employer] ITCH RELIEF) 1 % cream Apply 1  application topically as needed.   Not Taking at Unknown time    Assessment: 82 y/o male on warfarin PTA for Afib, hx stroke, tissue AVR admitted with GIB s/p EGD. GI ok with resuming warfarin. Pharmacy consulted to manage. CHADS2VASC is 64 (age, stroke, CHF, HTN, PVD).   INR is subtherapeutic at 1.61 s/p reversal with vitamin K, PRBC, and FFP. No further bleeding noted, Hgb up to 8.3, platelets are low at 75 (has hx platelets <150K dating back to 2012).  PTA regimen: 7.5 mg daily except 11.25 mg Mon  Goal of Therapy:  INR 2-3 Monitor platelets by anticoagulation protocol: Yes   Plan:  - Warfarin 10 mg PO tonight (slightly above home dose) - Daily INR - Monitor for s/sx of bleeding - Recommend close outpatient follow-up of INR   Renold Genta, PharmD, BCPS Clinical Pharmacist Phone for today - [place] - [phone] 01/05/2016 11:21 AM

## 2016-01-05 NOTE — Op Note (Signed)
[facility] Patient Name: Deshay Medd Procedure Date : 01/05/2016 MRN: [id number] Attending MD: Otis Brace , MD Date of Birth: 11-Dec-1926 CSN: [id number] Age: 82 Admit Type: Inpatient Procedure:                Upper GI endoscopy Indications:              Melena Providers:                Otis Brace, MD, Carolynn Comment, RN,                            William Dalton, Technician Referring MD:              Medicines:                Sedation Administered by an Anesthesia Professional Complications:            No immediate complications. Estimated Blood Loss:     Estimated blood loss: none. Procedure:                Pre-Anesthesia Assessment:                           - Prior to the procedure, a History and Physical                            was performed, and patient medications and                            allergies were reviewed. The patient's tolerance of                            previous anesthesia was also reviewed. The risks                            and benefits of the procedure and the sedation                            options and risks were discussed with the patient.                            All questions were answered, and informed consent                            was obtained. Prior Anticoagulants: The patient has                            taken Coumadin (warfarin), last dose was 3 days                            prior to procedure. ASA Grade Assessment: III - A                            patient with severe systemic disease. After  reviewing the risks and benefits, the patient was                            deemed in satisfactory condition to undergo the                            procedure.                           After obtaining informed consent, the endoscope was                            passed under direct vision. Throughout the                            procedure, the patient's blood pressure,  pulse, and                            oxygen saturations were monitored continuously. The                            [id number]) scope was introduced through the                            mouth, and advanced to the second part of duodenum.                            The upper GI endoscopy was accomplished with ease.                            The patient tolerated the procedure well. Scope In: Scope Out: Findings:      The Z-line was regular and was found 38 cm from the incisors.      A 5 cm hiatal hernia was present.      Multiple dispersed, small non-bleeding erosions were found in the       prepyloric region of the stomach. There were no stigmata of recent       bleeding. Biopsies were taken with a cold forceps for Helicobacter       pylori testing.      The cardia and gastric fundus were normal on retroflexion.      The duodenal bulb, first portion of the duodenum and second portion of       the duodenum were normal. Prominent ampull noted with normal apeating       overlying mucosa. Impression:               - Z-line regular, 38 cm from the incisors.                           - 5 cm hiatal hernia.                           - Non-bleeding erosive gastropathy. Biopsied.                           - Normal duodenal  bulb, first portion of the                            duodenum and second portion of the duodenum. Moderate Sedation:      Moderate (conscious) sedation was personally administered by an       anesthesia professional. The following parameters were monitored: oxygen       saturation, heart rate, blood pressure, and response to care. Recommendation:           - Return patient to hospital ward for ongoing care.                           - Resume previous diet.                           - Continue present medications.                           - Await pathology results.                           - ok to Resume Coumadin from Gi stand point. Procedure Code(s):         --- Professional ---                           [phone], Esophagogastroduodenoscopy, flexible,                            transoral; with biopsy, single or multiple Diagnosis Code(s):        --- Professional ---                           K44.9, Diaphragmatic hernia without obstruction or                            gangrene                           K31.89, Other diseases of stomach and duodenum                           K92.1, Melena (includes Hematochezia) CPT copyright 2016 [employer] Association. All rights reserved. The codes documented in this report are preliminary and upon coder review may  be revised to meet current compliance requirements. Otis Brace, MD Otis Brace, MD 01/05/2016 10:23:49 AM Number of Addenda: 0

## 2016-01-05 NOTE — Telephone Encounter (Signed)
Spoke with wife she states that pt is in the hospital and just wanted to let Dr Martinique know that pt had an upper GI and that they have stopped pt coumadin and ASA. She is concerned and just wanted to let you know

## 2016-01-05 NOTE — Telephone Encounter (Signed)
New Message  Pts wife voiced she is calling us to let us know pt is in hospital and for [place] to decide which medications he should take going further.  Pts wife voiced stated for [place] to monitor the pts chart.  Please f/u with pt

## 2016-01-05 NOTE — Anesthesia Procedure Notes (Signed)
Procedure Name: MAC Date/Time: 01/05/2016 10:00 AM Performed by: Garrison Columbus T Pre-anesthesia Checklist: Patient identified, Emergency Drugs available, Suction available and Patient being monitored Patient Re-evaluated:Patient Re-evaluated prior to inductionOxygen Delivery Method: Simple face mask Preoxygenation: Pre-oxygenation with 100% oxygen Intubation Type: IV induction Placement Confirmation: positive ETCO2 and breath sounds checked- equal and bilateral Dental Injury: Teeth and Oropharynx as per pre-operative assessment

## 2016-01-05 NOTE — Progress Notes (Signed)
Subjective: Patient reports having a coughing spell for two hours overnight. He says that it is normal to have spells but not for two hours. The patient denies any recent fever, chills, new SOB, chest tightness or change in mucus color. He admits to some increase in energy. The patient's last BM was Monday. The patient's wife expressed concern for observation vs inpatient status. We explained that he had been changed to inpatient. The patient overall has no complaints and is ready for his procedure.   The patient's H/H improved to 8.3/23.7 and INR to 1.61 s/p 2 units of packed RBCs and 2xFFP. Tele continues to exhibit bradycardia down to 39-49s overnight.   Objective: Vital signs in last 24 hours: Vitals:   01/05/16 0850 01/05/16 1027 01/05/16 1030 01/05/16 1035  BP: (!) 157/50 (!) 94/33 (!) 106/41 (!) 117/46  Pulse: (!) 56 63 65 60  Resp: (!) 22 12 (!) 22 20  Temp: 97.7 F (36.5 C) 97.4 F (36.3 C)    TempSrc: Oral Oral    SpO2: 99% 98% 98% 96%  Weight:      Height:       Weight change: 1.95 kg (4 lb 4.8 oz)  Intake/Output Summary (Last 24 hours) at 01/05/16 1057 Last data filed at 01/05/16 1015  Gross per 24 hour  Intake           2695.5 ml  Output             1160 ml  Net           1535.5 ml   General appearance: alert, oriented x4, no distress and pale HEENT: normocephalic and atruamatic PERRL. MMM. Anicteric. No oral lesions present.  Cv: Sternal scar present. Irregular rate and rhythm. Nl s1 s2. Early peaking III/VI Systolic murmur radiating to both carotids loudest in upper sternal borders. Distal pulses 2+. Warm extremities. No LE edema. Resp: No appearent scars on back. NWB. CTAB.  Abdomen: NBS. Slightly more distended and tender at epigastric. No rebound.   Rectal: Deferred 2/2 to ED eval Neurologic: Facial movements symmetric. No dysarthria. Uvula and tongue midline. EOM intact. No dysphagia or dysphonia.  Psych: Euthymic affect and appropriate mood Lab Results: H/H  improved to 8.3/23.7 from 6.7/19.4. INR improved to 3.8 from 1.61.  Micro Results: No results found for this or any previous visit (from the past 240 hour(s)). Studies/Results: Dg Chest Portable 1 View  Result Date: 01/03/2016 CLINICAL DATA:  Syncope EXAM: PORTABLE CHEST 1 VIEW COMPARISON:  09/07/2014 FINDINGS: Normal heart size and mediastinal contours after CABG and aortic valve replacement. There is no edema, consolidation, effusion, or pneumothorax. No acute osseous finding. EKG leads create artifact over the chest. IMPRESSION: Stable.  No evidence of acute disease. Electronically Signed   By: Monte Fantasia M.D.   On: 01/03/2016 13:57   ECG: no change from prior strip in 06/2015. Not sinus rhythm. Irregular rate with no discernible p waves. QRS is >135ms. QT wnl. Normal axis. No evidence of ST elevations or depressions. No path q present.   Medications: I have reviewed the patient's current medications. Scheduled Meds: . cyanocobalamin  1,000 mcg Intramuscular Q30 days  . latanoprost  1 drop Both Eyes QHS  . levothyroxine  25 mcg Oral QAC breakfast  . pantoprazole  40 mg Oral Daily  . sodium chloride flush  3 mL Intravenous Q12H   Continuous Infusions: . lactated ringers     PRN Meds:.acetaminophen **OR** acetaminophen Assessment/Plan: Principal Problem:   GI bleed  Active Problems:   CKD stage G2/A2, GFR 60-89 and albumin creatinine ratio 30-299 mg/g  dak, cheung is a 82 yo M with a significant PMH of atrial fibrillation currently on warfarin, h/o stroke, Aortic Stenosis s/p tissue AVR [id number], Diastolic HFpEF, CAD s/p CAPG 5 vessel 2000, AAA s/p endograft who presented to EMS with syncope 2/2 to GI bleed.   Upper GI Bleed The patient's three day history of black stools, weakness, orthostatic syncope, everyday use of Aleve and warfarin most likely points to a upper GI bleed. The decrease in H/H from 12.7/35.6>8.9/25.9, positive fecal occult blood, elevated BUN, and pallor on  exam is suggestive of this as well.    Patient H/H dropped from 8.9/25.8 to 6.7/19.4. After 2xRPCs and 2xFFP, H/H 8.3/23.7 and INR is now 1.61.  - Switch Pantoprazole 8 mg/hr IV to oral 40mg  qd - Continue Warfarin  - Start ASA 81 mg in 3 days if no bleeding - Stop NSAIDS  - tele - vitals q4 - Monitor stools for melena/hematochezia  - cbc qd - PT/INR qd   Syncope Most likely 2/2 to bleed. Although the exam showed MMM, his recent increase to lasix 40mg  qd may have contributed his orthostatic syncope as suggested by hyaline casts in urine sed and elevated BUN/Cr ratio>20. A fib and related bradycardia may have caused this as well.   - hold furosemide 20 mg PRN 2/2 to soft pressures and hypovolemia  Atrial fibrillationPatient is in a fib presently but bradycardic to 39. With wide QRSs, may have a junctional rhythm. CHADSVASC score of 7. Will continue Rate control when stable after procedure.   - tele as above  - Stop rate control carvedilol 6.25 qd 2/2 to bradycardia and soft pressures - Monitor for RVR with 5 mg IV metoprolol PRN  Diastolic HFpEF No signs of volume overload. Most recent echo 08/2014 could not evaluate diastolic function   - Up 1.7 L since admission - Hold furosemide as above - hold losartan 25 mg qd 2/2 to soft pressures - ECHO to revaulate - Strict I&Os  HTN Patient had soft pressures at arrival. Patient became hypertensive to 160s overnight and hypotensive after the procedure. Will continue to monitor.  - Hold Carvedilol as above - Hold furosemide as above - hold losartan as above  Hypothyroidism - TSH of 3.576 - continue levothyroxine 36mcg qd  Right Knee Arthritis - Hold aleve   - PRN acetaminophen 650mg  q6  FEN/GI - Advance diet - DVT prophylaxis: SCDs 2/2 to bleed  Access - pIV x1  Resuscitation - DNR  This is a Careers information officer Note.  The care of the patient was discussed with Dr. Wynetta Emery and the  assessment and plan formulated with their assistance.  Please see their attached note for official documentation of the daily encounter.   LOS: 1 day   Benn Moulder, Medical Student 01/05/2016, 10:57 AM

## 2016-01-05 NOTE — Anesthesia Preprocedure Evaluation (Deleted)
Anesthesia Evaluation    Reviewed: Allergy & Precautions, NPO status , Patient's Chart, lab work & pertinent test results  Airway        Dental  (+) Dental Advisory Given   Pulmonary shortness of breath, COPD, Current Smoker,           Cardiovascular hypertension, Pt. on home beta blockers and Pt. on medications + CAD, + Cardiac Stents, + CABG, + Peripheral Vascular Disease and +CHF  + dysrhythmias Atrial Fibrillation + Valvular Problems/Murmurs      Neuro/Psych CVA    GI/Hepatic   Endo/Other  Hypothyroidism   Renal/GU      Musculoskeletal  (+) Arthritis ,   Abdominal   Peds  Hematology   Anesthesia Other Findings   Reproductive/Obstetrics                             Anesthesia Physical Anesthesia Plan  ASA: IV  Anesthesia Plan: MAC   Post-op Pain Management:    Induction: Intravenous  Airway Management Planned: Simple Face Mask and Natural Airway  Additional Equipment:   Intra-op Plan:   Post-operative Plan:   Informed Consent: I have reviewed the patients History and Physical, chart, labs and discussed the procedure including the risks, benefits and alternatives for the proposed anesthesia with the patient or authorized representative who has indicated his/her understanding and acceptance.   Dental advisory given  Plan Discussed with: CRNA, Anesthesiologist and Surgeon  Anesthesia Plan Comments:         Anesthesia Quick Evaluation

## 2016-01-05 NOTE — Telephone Encounter (Signed)
I am aware. They will be resuming coumadin  Peter Martinique MD, [facility]

## 2016-01-05 NOTE — Anesthesia Preprocedure Evaluation (Addendum)
Anesthesia Evaluation  Patient identified by MRN, date of birth, ID band Patient awake    Reviewed: Allergy & Precautions, H&P , NPO status , Patient's Chart, lab work & pertinent test results  Airway Mallampati: II  TM Distance: >3 FB Neck ROM: full    Dental  (+) Edentulous Upper, Dental Advisory Given   Pulmonary COPD, Current Smoker,    breath sounds clear to auscultation       Cardiovascular hypertension, + CAD, + Peripheral Vascular Disease and +CHF  + dysrhythmias Atrial Fibrillation + Valvular Problems/Murmurs AS  Rhythm:regular Rate:Normal  S/p AAA stent graft. S/p AVR in 2012.   Neuro/Psych CVA    GI/Hepatic GI bleed   Endo/Other  Hypothyroidism   Renal/GU      Musculoskeletal  (+) Arthritis ,   Abdominal   Peds  Hematology  (+) anemia ,   Anesthesia Other Findings   Reproductive/Obstetrics                            Anesthesia Physical Anesthesia Plan  ASA: III  Anesthesia Plan: MAC   Post-op Pain Management:    Induction: Intravenous  Airway Management Planned: Nasal Cannula  Additional Equipment:   Intra-op Plan:   Post-operative Plan:   Informed Consent: I have reviewed the patients History and Physical, chart, labs and discussed the procedure including the risks, benefits and alternatives for the proposed anesthesia with the patient or authorized representative who has indicated his/her understanding and acceptance.     Plan Discussed with: CRNA, Anesthesiologist and Surgeon  Anesthesia Plan Comments:         Anesthesia Quick Evaluation

## 2016-01-05 NOTE — Progress Notes (Signed)
Physical Therapy Treatment Patient Details Name: [name] MRN: [id number] DOB: Jan 30, 1927 Today's Date: 01/05/2016    History of Present Illness [name], [name] is a 82 yo M with a significant PMH of atrial fibrillation, currently on warfarin, Aortic Stenosis s/p tissue AVR 10/2010, HFpEF (echo 08/2014), CAD s/p CABG 5 vessel 2000, AAA s/p endograft who presented to the EMS with syncope. The patient states he became very weak when he tried to come back into his house after finishing his beer and cigar on the porch. Pt found to have a GI bleed    PT Comments    Pt performed increased gait but remains to require significant assist with transfer from standard seat height.  Pt would continue to benefit from skilled rehab in a skilled nursing facility to improve strength and functional mobility before returning home.     Follow Up Recommendations  SNF;Supervision/Assistance - 24 hour     Equipment Recommendations  None recommended by PT    Recommendations for Other Services       Precautions / Restrictions Precautions Precautions: Fall Precaution Comments: freq falls Restrictions Weight Bearing Restrictions: No    Mobility  Bed Mobility Overal bed mobility: Needs Assistance Bed Mobility: Supine to Sit     Supine to sit: Min guard     General bed mobility comments: Cues for hand placement and lower extremity advancement to edge of bed.    Transfers Overall transfer level: Needs assistance Equipment used: Rolling walker (2 wheeled) Transfers: Sit to/from Stand Sit to Stand: Mod assist         General transfer comment: Pt required cues for hand placement and boost into standing.  Pt slow to ascend and reaching to pull on RW.    Ambulation/Gait Ambulation/Gait assistance: Min assist Ambulation Distance (Feet): 150 Feet Assistive device: Rolling walker (2 wheeled) Gait Pattern/deviations: Step-through pattern;Shuffle;Trunk flexed Gait velocity: slow Gait  velocity interpretation: Below normal speed for age/gender General Gait Details: Cues for upper trunk control and cues for RW positioning and increasing B stidelength   Stairs            Wheelchair Mobility    Modified Rankin (Stroke Patients Only)       Balance Overall balance assessment: Needs assistance   Sitting balance-Leahy Scale: Good       Standing balance-Leahy Scale: Fair Standing balance comment: Requires RW in standing for support.                      Cognition Arousal/Alertness: Awake/alert Behavior During Therapy: WFL for tasks assessed/performed Overall Cognitive Status: Within Functional Limits for tasks assessed                      Exercises General Exercises - Lower Extremity Ankle Circles/Pumps: AROM;Both;10 reps;Supine Quad Sets: AROM;Both;10 reps;Supine Heel Slides: AROM;Both;10 reps;Supine Hip ABduction/ADduction: AROM;Both;10 reps;Supine Straight Leg Raises: AROM;Both;10 reps;Supine    General Comments        Pertinent Vitals/Pain Pain Assessment: 0-10 Pain Score: 10-Worst pain ever Pain Location: generalized weakness and soreness Pain Descriptors / Indicators: Sore Pain Intervention(s): Monitored during session;Repositioned    Home Living                      Prior Function            PT Goals (current goals can now be found in the care plan section) Acute Rehab PT Goals Patient Stated Goal: get better Potential to  Achieve Goals: Good Progress towards PT goals: Progressing toward goals    Frequency    Min 3X/week      PT Plan Current plan remains appropriate    Co-evaluation             End of Session Equipment Utilized During Treatment: Gait belt Activity Tolerance: Patient limited by fatigue Patient left: in chair;with call bell/phone within reach;with chair alarm set     Time: 1640-1705 PT Time Calculation (min) (ACUTE ONLY): 25 min  Charges:  $Gait Training: 8-22  mins $Therapeutic Exercise: 8-22 mins                    G Codes:      Cristela Blue Jan 12, 2016, 5:17 PM  Governor Rooks, PTA pager [phone]

## 2016-01-05 NOTE — Progress Notes (Signed)
Subjective: NPO for EGD this morning. INR corrected to 1.6 (<2) and Hb 8.3 s/p 2 units pRBCs yesterday, some improvement in energy level after transfusions. [name] is in good spirits this morning. He reports some difficulty with cough overnight. He has a chronic cough productive of white/yellow sputum that occurs several times daily. He had a coughing fit for "about 2 hours" overnight that resolved after he expectorated. [name] and [name] wife attribute this to [name] being stuck in bed more than usual since being in the hospital. He reports no new melanotic stools, but also no BMs since Monday. He has no complaints today. [name] wife was concerned over a message they received yesterday w/r/t observation status, we discussed this and provided reassurance that he has been changed to inpatient.   Brief review of telemetry - intermittent PVCs, few brief episodes of bradycardia to HR 30s-40s overnight  Objective: Vital signs in last 24 hours: Vitals:   01/04/16 2059 01/04/16 2132 01/04/16 2256 01/05/16 0451  BP: (!) 158/59 (!) 157/77 (!) 167/60 (!) 132/57  Pulse: 72 65 71 63  Resp: (!) 22 20 20 19   Temp: 97.9 F (36.6 C) 98.1 F (36.7 C) 97.6 F (36.4 C) 97.7 F (36.5 C)  TempSrc: Oral Oral Oral Oral  SpO2: 100% 100% 99% 99%  Weight:    83.6 kg (184 lb 4.8 oz)  Height:        Intake/Output Summary (Last 24 hours) at 01/05/16 [id number] Last data filed at 01/05/16 [id number]  Gross per 24 hour  Intake           2195.5 ml  Output             1150 ml  Net           1045.5 ml    Physical Exam General appearance: Elderly gentleman resting comfortably in bed, in no distress, conversational, pallor HENT: Normocephalic, atraumatic moist mucous membranes, neck supple, no JVD Cardiovascular: Irregular rhythm with normal rate, soft ejection murmur, no rubs, gallops Respiratory: Clear to auscultation bilaterally, no rhonchi or wheezing, normal work of breathing Abdomen: Decreased BS, soft, mild epigastric  tenderness to palpation, non-distended Extremities: Normal bulk and range of motion, trace pitting edema to mid calves bilaterally, 2+ peripheral pulses Skin: Warm, dry, intact, scattered solar lentigo Neuro: Alert and oriented Psych: Appropriate affect, clear speech, thoughts linear and goal-directed  Labs / Imaging / Procedures: CBC Latest Ref Rng & Units 01/05/2016 01/04/2016 01/03/2016  WBC 4.0 - 10.5 K/uL 5.6 4.5 8.8  Hemoglobin 13.0 - 17.0 g/dL 8.3(L) 6.7(LL) 8.9(L)  Hematocrit 39.0 - 52.0 % 23.7(L) 19.4(L) 25.9(L)  Platelets 150 - 400 K/uL 75(L) 84(L) 108(L)   BMP Latest Ref Rng & Units 01/04/2016 01/03/2016 06/29/2015  Glucose 65 - 99 mg/dL 110(H) 129(H) 80  BUN 6 - 20 mg/dL 77(H) 83(H) 13  Creatinine 0.61 - 1.24 mg/dL 1.17 1.24 0.99  Sodium 135 - 145 mmol/L 140 140 133(L)  Potassium 3.5 - 5.1 mmol/L 3.9 4.5 4.1  Chloride 101 - 111 mmol/L 113(H) 113(H) 100(L)  CO2 22 - 32 mmol/L 22 20(L) 25  Calcium 8.9 - 10.3 mg/dL 8.6(L) 9.1 9.6   No results found.  Assessment/Plan: [name] is a 82 y.o. gentleman with PMH AAA s/p repair 2010/2012, IDA, AS s/p bioprosthetic AVR on Coumadin, chronic Afib, HFpEF (LVEF 55-60% in 08/2014), CAD s/p CABGx5 in 2000, emphysema, gout, carotid stenosis, HTN, HLD, CVA x3, and DJD admitted for syncope and melena.  Active Problems:  GI bleed with acute blood loss anemia, 2-3 days of melena at home, on Warfarin for atrial fibrillation, endorses daily NSAID use, ongoing tobacco and alcohol use. Hemoglobin 8.9 from 12.7 in May 2017. Hemodynamically stable but melanotic stool visible on ED rectal exam and FOBT+. Colonoscopy 5 years ago but has never had EGD, concern for UGIB from PUD or malignancy. Found to have Hb 6.7 and INR 3.8 on 11/21 and received 2 units blood and 2 units FFP, in addition to IV vitamin K. - Appreciate GI recs, defer EGD until INR<2 - AM CBC - Hb 8.3, transfuse Hb < 8 given significant CVD - AM INR - 1.6, goal INR <2 for EGD -  Trend daily CBC and INR - Protonix 40mg  BID - Hold home Warfarin, Asprin - NPO, resume HH diet after endoscopy - Avoid NSAIDs  Syncope, may be secondary to hypovolemia with recent increased diuresis, bradycardia (BB dose), arrhythmia, worsening CHF, symptom improvement following IVF resusc via EMS. Intermittent bradycardia visible on tele, may be the source of [name] syncopal symptoms. - Telemetry - Follow TTE  - mIVF 75 cc D5 1/2 NS as needed  HFpEF, (LVEF 55-60% in 08/2014 with severe LAE and moderate RAE) - Update TTE - Strict I/Os and daily weights - Hold home lasix 20 QD PRN given current concern for overdiuresis - Holding home Coreg 6.25 mg daily - Holding home Losartan 25 mg daily - Holding home Aspirin 81mg   Atrial fibrillation, CHADSVASc score 7, typical INR goal 2-3, ongoing but appears rate-controlled. Intermittent episodes of transient bradycardia overnight concerning for some degree of heart block, may need evaluation for pacemaker or ICD in the future - Telemetry, monitor for RVR - Holding Warfarin and Coreg  Cough, chronic secondary to COPD and smoking, likely worsened by inactivity and hypoventilation in hospital, patient as with mild dCHF and we have been holding diuretics - Monitor for fever, oxygen requirement - Low threshold for CXR - Consider incentive spirometry and or mucolytic  Bioprosthetic aortic valve, on chronic Warfarin - Hold Warfarin given GIB  HTN, currently with soft blood pressures - holding home Coreg and Losartan  Hypothyroidism, TSH of 3.576 - Continue levothyroxine 73mcg qd  Knee osteoarthritis, MSK - PT eval 11/21 recommending SNF, however exam yesterday likely limited by fatigue/anemia - Tylenol PRN - Avoid NSAIDs  FEN/GI:  NPO >> HH diet , replete electrolytes as needed  DVT ppx: SCDs  Dispo: Anticipated discharge in approximately 2-3 day(s).    LOS: 1 day   [name], MD 01/05/2016, 7:33 AM Pager: [phone]

## 2016-01-05 NOTE — Brief Op Note (Signed)
01/03/2016 - 01/05/2016  10:25 AM  PATIENT:  [name]  82 y.o. male  PRE-OPERATIVE DIAGNOSIS:  gi BLEED  POST-OPERATIVE DIAGNOSIS:  gastric antral erosions, hiatal hernia, gastric biopsies  PROCEDURE:  Procedure(s): ESOPHAGOGASTRODUODENOSCOPY (EGD) (N/A)  SURGEON:  Surgeon(s) and Role:    * Otis Brace, MD - Primary  Recommendations ------------------------- - Advance diet -Once a day PPI - Avoid NSAIDs - OK to resume Coumadin from GI standpoint. - GI will sign off. Call us back if needed

## 2016-01-05 NOTE — Anesthesia Postprocedure Evaluation (Signed)
Anesthesia Post Note  Patient: [name]  Procedure(s) Performed: Procedure(s) (LRB): ESOPHAGOGASTRODUODENOSCOPY (EGD) (N/A)  Patient location during evaluation: PACU Anesthesia Type: MAC Level of consciousness: awake and alert Pain management: pain level controlled Vital Signs Assessment: post-procedure vital signs reviewed and stable Respiratory status: spontaneous breathing, nonlabored ventilation, respiratory function stable and patient connected to nasal cannula oxygen Cardiovascular status: stable and blood pressure returned to baseline Anesthetic complications: no    Last Vitals:  Vitals:   01/05/16 1030 01/05/16 1035  BP: (!) 106/41 (!) 117/46  Pulse: 65 60  Resp: (!) 22 20  Temp:      Last Pain:  Vitals:   01/05/16 1027  TempSrc: Oral  PainSc:                  [place]

## 2016-01-06 DIAGNOSIS — Z8673 Personal history of transient ischemic attack (TIA), and cerebral infarction without residual deficits: Secondary | ICD-10-CM

## 2016-01-06 DIAGNOSIS — K921 Melena: Secondary | ICD-10-CM

## 2016-01-06 DIAGNOSIS — N182 Chronic kidney disease, stage 2 (mild): Secondary | ICD-10-CM

## 2016-01-06 LAB — CBC
HEMATOCRIT: 25.1 % — AB (ref 39.0–52.0)
Hemoglobin: 8.6 g/dL — ABNORMAL LOW (ref 13.0–17.0)
MCH: 33.9 pg (ref 26.0–34.0)
MCHC: 34.3 g/dL (ref 30.0–36.0)
MCV: 98.8 fL (ref 78.0–100.0)
PLATELETS: 92 10*3/uL — AB (ref 150–400)
RBC: 2.54 MIL/uL — ABNORMAL LOW (ref 4.22–5.81)
RDW: 17.1 % — AB (ref 11.5–15.5)
WBC: 5.2 10*3/uL (ref 4.0–10.5)

## 2016-01-06 LAB — PROTIME-INR
INR: 1.38
Prothrombin Time: 17.1 seconds — ABNORMAL HIGH (ref 11.4–15.2)

## 2016-01-06 MED ORDER — WARFARIN SODIUM 7.5 MG PO TABS
7.5000 mg | ORAL_TABLET | Freq: Once | ORAL | Status: AC
  Administered 2016-01-06: 7.5 mg via ORAL
  Filled 2016-01-06: qty 1

## 2016-01-06 MED ORDER — CARVEDILOL 3.125 MG PO TABS
3.1250 mg | ORAL_TABLET | Freq: Two times a day (BID) | ORAL | Status: DC
  Administered 2016-01-06 – 2016-01-11 (×12): 3.125 mg via ORAL
  Filled 2016-01-06 (×12): qty 1

## 2016-01-06 MED ORDER — LOSARTAN POTASSIUM 50 MG PO TABS
25.0000 mg | ORAL_TABLET | Freq: Every day | ORAL | Status: DC
  Administered 2016-01-06 – 2016-01-11 (×6): 25 mg via ORAL
  Filled 2016-01-06 (×7): qty 1

## 2016-01-06 MED ORDER — POLYETHYLENE GLYCOL 3350 17 G PO PACK
17.0000 g | PACK | Freq: Every day | ORAL | Status: DC
  Administered 2016-01-06 – 2016-01-10 (×4): 17 g via ORAL
  Filled 2016-01-06 (×6): qty 1

## 2016-01-06 MED ORDER — SENNA 8.6 MG PO TABS
1.0000 | ORAL_TABLET | Freq: Every day | ORAL | Status: DC
  Administered 2016-01-06 – 2016-01-10 (×5): 8.6 mg via ORAL
  Filled 2016-01-06 (×6): qty 1

## 2016-01-06 NOTE — Progress Notes (Addendum)
ANTICOAGULATION CONSULT NOTE - Initial Consult  Pharmacy Consult for warfarin Indication: atrial fibrillation  No Known Allergies  Patient Measurements: Height: 5\' 10"  (177.8 cm) Weight: 184 lb 11.9 oz (83.8 kg) IBW/kg (Calculated) : 73  Vital Signs: Temp: 97.4 F (36.3 C) (11/23 0557) Temp Source: Oral (11/23 0557) BP: 163/60 (11/23 0557) Pulse Rate: 81 (11/23 0557)  Labs:  Recent Labs  01/03/16 1235 01/04/16 0505 01/05/16 0126 01/05/16 1229 01/06/16 0842  HGB 8.9* 6.7* 8.3* 8.4* 8.6*  HCT 25.9* 19.4* 23.7* 24.5* 25.1*  PLT 108* 84* 75* 75* 92*  LABPROT 32.6* 38.4* 19.3* 16.8* 17.1*  INR 3.09 3.80 1.61 1.35 1.38  CREATININE 1.24 1.17  --   --   --     Estimated Creatinine Clearance: 45.9 mL/min (by C-G formula based on SCr of 1.17 mg/dL).  Assessment: 82 y/o male on warfarin PTA for Afib, hx stroke, and tissue AVR. Admitted with GIB s/p EGD. GI ok with resuming warfarin at this time and pharmacy consulted to manage. CHADS2VASC is 85 (age, stroke, CHF, HTN, PVD).   INR is subtherapeutic at 1.38 s/p reversal with vitamin K (11/21), PRBC, and FFP. No further bleeding noted at this time. Hgb up to 8.6 and stable. Platelets noted to be low at 92 (has hx platelets <150K dating back to 2012). Internal medicine team recommending to resume warfarin at slightly reduced dose due to elevated INR on admission (3.09).   PTA regimen: 7.5 mg daily except 11.25 mg Mon  Goal of Therapy:  INR 2-3 Monitor platelets by anticoagulation protocol: Yes   Plan:  - Warfarin 7.5 mg PO x1 tonight  - Daily INR - Monitor for s/sx of bleeding - Recommend close outpatient follow-up of INR  Argie Ramming, PharmD Pharmacy Resident  Pager [phone] 01/06/16 8:26 AM

## 2016-01-06 NOTE — Progress Notes (Addendum)
Subjective: EGD yesterday revealed shallow non-bleeding gastric ulcers. Warfarin and diet resumed but he has had poor appetite, family at bedside also reported that [name] experienced coughing fits after eating on several occasions since yesterday. He attempted a BM this morning but it was mostly gas, although he experienced significant relief in abdominal discomfort. Hb stable at 8.6 today. Family expressed concern that he is spending too much time in bed and has talked with nursing staff to arrange for some ambulation with supervision today.  Objective: Vital signs in last 24 hours: Vitals:   01/05/16 1637 01/05/16 2214 01/06/16 0557 01/06/16 0800  BP: (!) 170/62 (!) 160/54 (!) 163/60 (!) 160/75  Pulse: 64 67 81 63  Resp:  20 20   Temp: 97.4 F (36.3 C)  97.4 F (36.3 C)   TempSrc: Oral  Oral   SpO2: 97% 100% 99%   Weight:   83.8 kg (184 lb 11.9 oz)   Height:        Intake/Output Summary (Last 24 hours) at 01/06/16 1036 Last data filed at 01/06/16 1017  Gross per 24 hour  Intake           711.66 ml  Output              450 ml  Net           261.66 ml    Physical Exam General appearance: Elderly gentleman resting comfortably in bed, in no distress, conversational HENT: Normocephalic, atraumatic Cardiovascular: Irregular rhythm with normal rate, soft ejection murmur, no rubs, gallops Respiratory: CTAB, normal work of breathing Abdomen: BS+, soft, non-tender to palpation, non-distended Extremities: Normal bulk and range of motion, trace pitting edema to mid calves bilaterally, 2+ peripheral pulses Skin: Warm, dry, intact, scattered solar lentigo Neuro: Alert and oriented Psych: Positive affect  Labs / Imaging / Procedures: CBC Latest Ref Rng & Units 01/06/2016 01/05/2016 01/05/2016  WBC 4.0 - 10.5 K/uL 5.2 5.6 5.6  Hemoglobin 13.0 - 17.0 g/dL 8.6(L) 8.4(L) 8.3(L)  Hematocrit 39.0 - 52.0 % 25.1(L) 24.5(L) 23.7(L)  Platelets 150 - 400 K/uL 92(L) 75(L) 75(L)   BMP  Latest Ref Rng & Units 01/04/2016 01/03/2016 06/29/2015  Glucose 65 - 99 mg/dL 110(H) 129(H) 80  BUN 6 - 20 mg/dL 77(H) 83(H) 13  Creatinine 0.61 - 1.24 mg/dL 1.17 1.24 0.99  Sodium 135 - 145 mmol/L 140 140 133(L)  Potassium 3.5 - 5.1 mmol/L 3.9 4.5 4.1  Chloride 101 - 111 mmol/L 113(H) 113(H) 100(L)  CO2 22 - 32 mmol/L 22 20(L) 25  Calcium 8.9 - 10.3 mg/dL 8.6(L) 9.1 9.6    Ref Range & Units 01/06/16 0842  Prothrombin Time 11.4 - 15.2 seconds 17.1    INR  1.38    No results found.  Assessment/Plan: [name] is a 82 y.o. gentleman with PMH AAA s/p repair 2010/2012, IDA, AS s/p bioprosthetic AVR on Coumadin, chronic Afib, HFpEF (LVEF 55-60% in 08/2014), CAD s/p CABGx5 in 2000, emphysema, gout, carotid stenosis, HTN, HLD, CVA x3, and DJD admitted for syncope and melena.  Active Problems:  GI bleed with acute blood loss anemia, 2-3 days of melena at home, on Warfarin for atrial fibrillation, endorses daily NSAID use, ongoing tobacco and alcohol use. Hemoglobin 8.9 from 12.7 in May 2017. Hemodynamically stable but melanotic stool visible on ED rectal exam and FOBT+. Colonoscopy 5 years ago but has never had EGD, concern for UGIB from PUD or malignancy. Found to have Hb 6.7 and INR 3.8  on 11/21 and received 2 units blood and 2 units FFP, in addition to IV vitamin K. EGD on 11/22 revealed shallow nonbleeding gastric antral ulcers. Cleared to resume home diet and warfarin - Appreciate GI recs - Follow EGD biopsy results - AM CBC - Hb 8.6, transfuse threshold Hb < 7 - AM INR - 1.38, goal INR <2 for EGD - Trend daily CBC and INR - Protonix 40mg  BID - Warfarin per pharmacy - Hold Aspirin - Avoid NSAIDs  Syncope, may be secondary to hypovolemia with recent increased diuresis, bradycardia (BB dose), arrhythmia, worsening CHF, symptom improvement following IVF resusc via EMS. Intermittent bradycardia visible on tele, may be the source of his syncopal symptoms. Hypotension resolved after  IVF resuscitation and hold home medications for 2 days. GIB has stopped. - Telemetry  HFpEF, (LVEF 55-60% in 08/2014 with severe LAE and moderate RAE), weight stable at 184 lbs  - Strict I/Os and daily weights - Resume home Coreg 6.25 mg daily - Resumehome Losartan 25 mg daily - Hold home lasix 20 QD PRN - Holding home Aspirin 81mg   Atrial fibrillation, CHADSVASc score 7, typical INR goal 2-3, ongoing but appears rate-controlled. Intermittent episodes of transient bradycardia overnight concerning for some degree of heart block, may need evaluation for pacemaker or ICD in the future - Telemetry, monitor for RVR - Holding Warfarin and Coreg  Cough, chronic secondary to COPD and smoking, likely worsened by inactivity and hypoventilation in hospital, patient as with mild dCHF and we have been holding diuretics. Family reports coughing with food intake which is concerning given patient's history of CVA - SLP eval - Monitor for fever, oxygen requirement - Low threshold for CXR - Consider incentive spirometry and or mucolytic  Constipation, likely secondary to poor po intake and sedentary inpatient level of activity - Activity as tolerated per RN - Added Senna QD - Added Miralax QD - Encourage work with PT  Bioprosthetic aortic valve, on chronic Warfarin - Hold Warfarin given GIB  HTN, hypertensive overnight - Resume home Coreg and Losartan  Hypothyroidism, TSH of 3.576 - Continue levothyroxine 57mcg qd  Knee osteoarthritis, MSK - Activity as tolerated - PT as tolerated, SNF - Tylenol PRN - Avoid NSAIDs  FEN/GI:  HH diet , replete electrolytes as needed  DVT ppx: SCDs  Dispo: Anticipated discharge in approximately 2-3 day(s). Case management working on SNF placement.   LOS: 2 days   [name], MD 01/06/2016, 10:36 AM Pager: [phone]

## 2016-01-06 NOTE — Progress Notes (Signed)
  Date: 01/06/2016  Patient name: [name]  Medical record number: [id number]  Date of birth: 1927/02/01   I have personally seen and evaluated this patient and the plan of care was discussed with the house staff. Please see Dr. Durenda Age note for complete details. I concur with his findings.  Sid Falcon, MD 01/06/2016, 2:54 PM

## 2016-01-07 LAB — BASIC METABOLIC PANEL
Anion gap: 5 (ref 5–15)
BUN: 21 mg/dL — ABNORMAL HIGH (ref 6–20)
CALCIUM: 8.7 mg/dL — AB (ref 8.9–10.3)
CO2: 25 mmol/L (ref 22–32)
CREATININE: 0.99 mg/dL (ref 0.61–1.24)
Chloride: 112 mmol/L — ABNORMAL HIGH (ref 101–111)
GFR calc Af Amer: >60 mL/min (ref >60)
GFR calc non Af Amer: >60 mL/min (ref >60)
GLUCOSE: 95 mg/dL (ref 65–99)
Potassium: 3.9 mmol/L (ref 3.5–5.1)
Sodium: 142 mmol/L (ref 135–145)

## 2016-01-07 LAB — CBC
HEMATOCRIT: 24.3 % — AB (ref 39.0–52.0)
Hemoglobin: 8.3 g/dL — ABNORMAL LOW (ref 13.0–17.0)
MCH: 33.7 pg (ref 26.0–34.0)
MCHC: 34.2 g/dL (ref 30.0–36.0)
MCV: 98.8 fL (ref 78.0–100.0)
Platelets: 86 10*3/uL — ABNORMAL LOW (ref 150–400)
RBC: 2.46 MIL/uL — ABNORMAL LOW (ref 4.22–5.81)
RDW: 16.3 % — AB (ref 11.5–15.5)
WBC: 4.7 10*3/uL (ref 4.0–10.5)

## 2016-01-07 LAB — FOLATE RBC
Folate, RBC: >2583 ng/mL (ref >498)
Hematocrit: 24 % — ABNORMAL LOW (ref 37.5–51.0)

## 2016-01-07 LAB — PROTIME-INR
INR: 1.6
Prothrombin Time: 19.2 seconds — ABNORMAL HIGH (ref 11.4–15.2)

## 2016-01-07 MED ORDER — WARFARIN SODIUM 7.5 MG PO TABS
7.5000 mg | ORAL_TABLET | Freq: Once | ORAL | Status: AC
  Administered 2016-01-07: 7.5 mg via ORAL
  Filled 2016-01-07: qty 1

## 2016-01-07 NOTE — Discharge Summary (Signed)
Name: [name] MRN: [id number] DOB: 10/04/1926 82 y.o. PCP: Tamsen Roers, MD  Date of Admission: 01/03/2016 12:06 PM Date of Discharge: 01/11/2016 Attending Physician: Annia Belt, MD  Discharge Diagnosis: 1. Upper GI bleed 2. Acute blood loss anemia 3. Gout, right foot  Principal Problem:   Gastrointestinal hemorrhage Active Problems:   Chronic atrial fibrillation (HCC)   Hypothyroidism   S/P AVR (aortic valve replacement)   CKD stage G2/A2, GFR 60-89 and albumin creatinine ratio 30-299 mg/g   Hx of CABG   History of completed stroke   Status post abdominal aortic aneurysm (AAA) repair   Gout attack   Constipation   Thrombocytopenia (HCC)   Discharge Medications:   Medication List    STOP taking these medications   ALEVE 220 MG Caps Generic drug:  Naproxen Sodium   aspirin EC 81 MG tablet     TAKE these medications   carvedilol 6.25 MG tablet Commonly known as:  COREG Take 3.125 mg by mouth 2 (two) times daily with a meal.   colchicine 0.6 MG tablet Take 1 tablet (0.6 mg total) by mouth daily.   [facility] RELIEF 1 % cream Generic drug:  clotrimazole Apply 1 application topically as needed.   cyanocobalamin 1000 MCG/ML injection Commonly known as:  (VITAMIN B-12) Inject 1,000 mcg into the muscle every 30 (thirty) days.   dorzolamide-timolol 22.3-6.8 MG/ML ophthalmic solution Commonly known as:  COSOPT 1 drop 2 (two) times daily.   doxylamine (Sleep) 25 MG tablet Commonly known as:  UNISOM Take 25 mg by mouth at bedtime.   ferrous sulfate 325 (65 FE) MG tablet Take 1 tablet (325 mg total) by mouth daily with breakfast.   furosemide 20 MG tablet Commonly known as:  LASIX Take 1 tablet (20 mg total) by mouth daily as needed for edema (for edema and shortness of breath). What changed:  how much to take  additional instructions   latanoprost 0.005 % ophthalmic solution Commonly known as:  XALATAN Place 1 drop into both eyes at  bedtime.   levothyroxine 25 MCG tablet Commonly known as:  SYNTHROID, LEVOTHROID TAKE ONE TABLET BY MOUTH BEFORE BREAKFAST. MUST HAVE OFFICE VISIT BEFORE FURTHER REFILLS.   losartan 25 MG tablet Commonly known as:  COZAAR Take 1 tablet (25 mg total) by mouth daily.   multivitamin tablet Take 1 tablet by mouth 2 (two) times daily.   Nasal Spray 0.05 % Soln Place into the nose as needed.   pantoprazole 40 MG tablet Commonly known as:  PROTONIX Take 1 tablet (40 mg total) by mouth daily.   polyethylene glycol packet Commonly known as:  MIRALAX / GLYCOLAX Take 17 g by mouth daily.   senna 8.6 MG Tabs tablet Commonly known as:  SENOKOT Take 1 tablet (8.6 mg total) by mouth daily.   warfarin 5 MG tablet Commonly known as:  COUMADIN Take 1 tablet (5 mg total) by mouth daily. What changed:  medication strength  how much to take  additional instructions       Disposition and follow-up:   [name] was discharged from [facility] in Stable condition.  At the hospital follow up visit please address:  1. Upper GI bleed with acute blood loss anemia - on Coumadin and found to have shallow gastric ulcers on EGD during this admission - assess for melena, hematemesis, or signs/symptoms of anemia  - Syncope - occurred prior to admission, coincided with hypotension and bradycardia and likely secondary  to overdiuresis - assess volume status and heart rate, also assess for orthostatic symptoms or vitals  - Atrial fibrillation, CHADSVASc score 7 - on Coumadin INR goal 2-3 and Carvedilol 3.125mg  BID for rate control - INR remained stable around 2.6-2.7 on Warfarin 5mg  daily for several days - assess INR and heart rate   - Knee osteoarthritis - impairs mobility secondary to pain - assess pain control and mobility and encourage PT as tolerated  - Gout - developed pain, swelling, erythema of base of right big toe and ankle on 11/26, pain improved with colchicine  - assess for resolution of symptoms, obtain uric acid level in intercritical period  - Thrombocytopenia - mild, platelets ranging 70 -120k this admission - appears somewhat chronic with documented low platelets intermittently since 2012 - assess platelet count and monitor for progression  2.  Labs / imaging needed at time of follow-up: CBC, INR, uric acid  3.  Pending labs/ test needing follow-up: None  Follow-up Appointments: Follow-up Information    Griffith,[name], MD. Schedule an appointment as soon as possible for a visit.   Specialty:  Family Medicine Why:  For hospital follow up in 2-3 weeks Contact information: [address] [place] [address] [phone]           Hospital Course by problem list: Principal Problem:   Gastrointestinal hemorrhage Active Problems:   Chronic atrial fibrillation (HCC)   Hypothyroidism   S/P AVR (aortic valve replacement)   CKD stage G2/A2, GFR 60-89 and albumin creatinine ratio 30-299 mg/g   Hx of CABG   History of completed stroke   Status post abdominal aortic aneurysm (AAA) repair   Gout attack   Constipation   Thrombocytopenia (HCC)   1. Upper GI bleed with acute blood loss anemia  [name] is a 82 y.o. gentleman with PMH AAA s/p repair 2010/2012, AS s/p bioprosthetic AVR on Warfarin, chronic Afib, HFpEF (LVEF 55-60% in 08/2014), CAD s/p CABGx5 in 2000, emphysema, gout,  hypthyroidism, carotid stenosis, HTN, HLD, CVA x3, and DJD who presented on 11/20 with syncope and 2-3 days of melena and was admitted for GI bleed workup. His initial hemoglobin was 8.9 (from baseline 12+) and INR was 3.09 and he was hemodynamically stable but hypothermic, started on Protonix and given IV fluids, held Warfarin. On 11/21 his Hb had dropped to 6.7 and INR had increased to 3.8 and he was transfused 2 units pRBCs, 2 units fresh frozen plasma, and given IV vitamin K for Warfarin reversal and responded appropriately. By 11/22 his INR had corrected to  1.6 and Hb to 8.3 and he underwent EGD which revealed non-bleeding erosive gastropathy, biopsies negative for H. Pylori, and his diet and Warfarin were resumed. He remained stable and asymptomatic for several days, bowel regimen was accelerated due to constipation and he had two mildly melanotic stools on 11/24 although Hb has remained stable in 8-8.5 range. Warfarin was dosed by inpatient pharmacy and achieved therapeutic range INR (goal 2-3) by 11/26. He has remained stable on Protonix 40mg  daily, and we have continued to hold Aspirin and all NSAIDs. His bowel movements resumed and are non-melanotic. He was deemed stable for discharge to SNF on 11/28.   2. Syncope - experienced an episode upon standing prior to admission which coincided with hypotension and bradycardia and was likely secondary to overdiuresis and dehydration. He had a presyncopal episode on exertion the previous day as well. He has a history of chronic diastolic CHF and had  been taking double his Lasix dose for 3-4 days prior to admission due to some increased swelling in his lower legs. He received fluid boluses via EMS and his symptoms greatly improved by the time he arrived at our hospital, although decreased skin turgor remained. Lasix and antihypertensives were held and IV fluids were continued for the first 24 hours. Telemetry recorded several brief episodes of bradycardia to HR 40s-50s overnight but these were asymptomatic. He also received 2 units of pRBCs and 2 units of FFP on 11/21. Following this volume resuscitation he had no orthostatic symptoms and was ambulatory with nursing and physical therapy without incident. His Carvedilol 3.125 BID and Losartan 25mg  QD were restarted on 11/23 and daily Lasix 20mg  resumed on 11/25.  3. Atrial fibrillation, CHADSVASc score 7, on Warfarin with INR goal 2-3 - presented with melena and on Warfarin, initial INR was 3.09, and despite being held, INR increased to 3.8 on 11/21 with persistently  drop in Hb. Warfarn reversal via IV vitamin K and transfusion of 2 units FFP corrected his INR to 1.6 by 11/22. EGD revealed non-bleeding gastric ulcers and Warfarin was gradually resumed thereafter with dosing adjustments made by inpatient pharmacy. Carvedilol 3.125 mg BID was resumed on 11/23. INR reached therapeutic range by 11/26 and hovered around 2.6 to 2.7 during the last several days of admission on 5 mg Warfarin daily and he was discharge on 11/28 on this dose. His regimen prior to admission was 7.5 mg daily, except for 11.25 mg on Mondays. Outpatient follow up with regular INR monitoring will be necessary to optimize his Warfarin dosing regimen.  4. Knee osteoarthritis - longstanding impaired mobility requiring a walker at home, secondary to knee pain. Had previously been taking Aleve daily for pain but NSAIDs were stopped on admission. Patient's wife expressed concerns on admission that she has recently been unable to carry and lift him at home due to his weakened state. He was evaluated by physical therapy on 11/21 and felt to be very deconditioned with fatigue and poor mobility, they recommended SNF and 24-hour supervision/assistance.   5. Gout - developed sharp pains, swelling, erythema of base of right big toe and ankle on 11/26. Last reported gout attack was 5 years ago in his knees. His symptoms improved by 11/27 with colchicine. Received an initial 1.2mg  dose followed by 0.6 mg daily which he should continue for at least one week.   Discharge Vitals:   BP (!) 163/55 (BP Location: Left Arm)   Pulse (!) 50   Temp 98 F (36.7 C) (Oral)   Resp 16   Ht 5\' 10"  (1.778 m)   Wt 85.5 kg (188 lb 6.4 oz)   SpO2 98%   BMI 27.03 kg/m    Pertinent Labs, Studies, and Procedures:  CBC Latest Ref Rng & Units 01/11/2016 01/10/2016 01/09/2016  WBC 4.0 - 10.5 K/uL 3.9(L) 4.5 4.6  Hemoglobin 13.0 - 17.0 g/dL 8.4(L) 8.0(L) 8.3(L)  Hematocrit 39.0 - 52.0 % 24.2(L) 23.7(L) 24.5(L)  Platelets 150 -  400 K/uL 124(L) 122(L) 103(L)   Results for LAQUINTA, WIDER (MRN [id number]) as of 01/11/2016 11:49  Ref. Range 01/07/2016 06:35 01/08/2016 04:39 01/09/2016 05:34 01/10/2016 06:57 01/11/2016 06:23  INR Unknown 1.60 1.88 2.31 2.67 2.73    EGD - 01/05/16      Discharge Instructions: Discharge Instructions    Call MD for:  difficulty breathing, headache or visual disturbances    Complete by:  As directed    Call MD for:  extreme fatigue    Complete by:  As directed    Call MD for:  persistant nausea and vomiting    Complete by:  As directed    Call MD for:  severe uncontrolled pain    Complete by:  As directed    Call MD for:  temperature >100.4    Complete by:  As directed    Diet - low sodium heart healthy    Complete by:  As directed    Discharge instructions    Complete by:  As directed    Please continue to take your medications as prescribed and attend all hospital follow up appointments. You have a tendency to bleed from ulcers in your stomach and it is very important that you take Protonix daily, and avoid taking any NSAID medications (Ibuprofen, Naproxen, etc) or Aspirin. It is also important to cease any excess alcoholic consumption, especially hard alcoholic beverages - as these have a tendency to irritate the stomach and can interact with many of your medications.  We have prescribed Colchicine 0.6 mg daily to take for at least a week to help resolve the gout that is hurting your right foot.  We have prescribed iron supplement tablets (ferrous sulfate 325 mg daily) to help you recover faster from your anemia.   We have prescribed Miralax and Senna daily to treat your constipation.  Please schedule a hospital follow up appointment with your PCP/ Dr. Rex Kras for evaluation within 2 weeks. Also make sure you continue to have your INR checked on a regular basis to make sure that your Coumadin is at the correct dose.  If you develop any serious bleeding again in the future,  or other concerning symptoms - please visit the nearest urgent care or ER.   Increase activity slowly    Complete by:  As directed       Signed: [name], MD 01/11/2016, 11:49 AM   Pager: [phone]

## 2016-01-07 NOTE — Progress Notes (Signed)
Physical Therapy Treatment Patient Details Name: MELVON SUKO MRN: [id number] DOB: [birth year] Today's Date: 01/07/2016    History of Present Illness silvia, mcneilly is a 82 yo M with a significant PMH of atrial fibrillation, currently on warfarin, Aortic Stenosis s/p tissue AVR 10/2010, HFpEF (echo 08/2014), CAD s/p CABG 5 vessel 2000, AAA s/p endograft who presented to the EMS with syncope. The patient states he became very weak when he tried to come back into his house after finishing his beer and cigar on the porch. Pt found to have a GI bleed    PT Comments    Pt is progressing well with his mobility, but still needs min assist overall and needs significant cueing for safety with RW.  Wife would like for him to be at a supervision level and walking 4xs as far before she feels comfortable taking him home.  Wife to bring rollator up as this is his normal walking device on Monday.  Pt is still appropriate for SNF placement at discharge.    Follow Up Recommendations  SNF;Supervision/Assistance - 24 hour     Equipment Recommendations  None recommended by PT    Recommendations for Other Services   NA     Precautions / Restrictions Precautions Precautions: Fall Precaution Comments: freq falls    Mobility  Bed Mobility Overal bed mobility: Needs Assistance Bed Mobility: Supine to Sit     Supine to sit: Supervision;HOB elevated     General bed mobility comments: supervision for safety, HOB elevated, pt using railing.   Transfers Overall transfer level: Needs assistance Equipment used: Rolling walker (2 wheeled) Transfers: Sit to/from Stand Sit to Stand: Min assist;From elevated surface         General transfer comment: Min assist from elevated bed.  Verbal cues for safe hand placement and RW use.   Ambulation/Gait Ambulation/Gait assistance: Min assist Ambulation Distance (Feet): 150 Feet Assistive device: Rolling walker (2 wheeled) Gait Pattern/deviations:  Step-through pattern;Trunk flexed Gait velocity: decreased Gait velocity interpretation: Below normal speed for age/gender General Gait Details: Pt with flexed trunk, verbal and manual cues to stay inside of RW, Verbal cues for upright posture, min assist at trunk for balance and to help steer RW around obstacles.           Balance Overall balance assessment: Needs assistance Sitting-balance support: Feet supported;Bilateral upper extremity supported Sitting balance-Leahy Scale: Good     Standing balance support: Bilateral upper extremity supported Standing balance-Leahy Scale: Fair                      Cognition Arousal/Alertness: Awake/alert Behavior During Therapy: WFL for tasks assessed/performed Overall Cognitive Status: Within Functional Limits for tasks assessed                      Exercises General Exercises - Lower Extremity Long Arc Quad: AROM;Both;10 reps;Seated Hip ABduction/ADduction: AROM;Both;10 reps;Seated (adduction only against pillow for resistance. ) Hip Flexion/Marching: AROM;Both;10 reps;Seated Toe Raises: AROM;Both;10 reps;Seated Heel Raises: AROM;Both;10 reps;Seated        Pertinent Vitals/Pain Pain Assessment: No/denies pain           PT Goals (current goals can now be found in the care plan section) Acute Rehab PT Goals Patient Stated Goal: get better Progress towards PT goals: Progressing toward goals    Frequency    Min 3X/week      PT Plan Current plan remains appropriate       End of  Session Equipment Utilized During Treatment: Gait belt Activity Tolerance: Patient limited by fatigue Patient left: in chair;with call bell/phone within reach;with chair alarm set;with family/visitor present     Time: [id number] PT Time Calculation (min) (ACUTE ONLY): 41 min  Charges:  $Gait Training: 8-22 mins $Therapeutic Exercise: 8-22 mins $Self Care/Home Management: 8-22            Ramsha Lonigro B. Ciarah Peace, PT, DPT  [phone]   01/07/2016, 2:58 PM

## 2016-01-07 NOTE — Progress Notes (Addendum)
Subjective: [name] reports no successful BMs yet but plans to try again this morning. He was able to eat two large meals yesterday with some assistance from family and had no issues with coughing. He denies abdominal pain and feels well overall this morning.   Objective: Vital signs in last 24 hours: Vitals:   01/06/16 1659 01/06/16 2121 01/07/16 0612 01/07/16 0803  BP: (!) 126/46 (!) 153/60 (!) 161/63 (!) 162/67  Pulse: 64 (!) 56 64 64  Resp:  18 19   Temp:  97.6 F (36.4 C) 97.7 F (36.5 C)   TempSrc:  Oral Oral   SpO2:  100% 100%   Weight:      Height:        Intake/Output Summary (Last 24 hours) at 01/07/16 0849 Last data filed at 01/06/16 1627  Gross per 24 hour  Intake           346.17 ml  Output                0 ml  Net           346.17 ml    Physical Exam General appearance: Elderly gentleman resting comfortably in bed, in no distress, conversational HENT: Normocephalic, atraumatic Cardiovascular: Irregular rhythm with normal rate, soft ejection murmur, no rubs, gallops Respiratory: CTAB, normal work of breathing Abdomen: BS+, soft, non-tender to palpation, non-distended Extremities: Normal bulk and range of motion, no edema , 2+ peripheral pulses Skin: Warm, dry, intact, scattered solar lentigo Neuro: Alert and oriented Psych: Positive affect  Labs / Imaging / Procedures: CBC Latest Ref Rng & Units 01/07/2016 01/06/2016 01/05/2016  WBC 4.0 - 10.5 K/uL 4.7 5.2 5.6  Hemoglobin 13.0 - 17.0 g/dL 8.3(L) 8.6(L) 8.4(L)  Hematocrit 39.0 - 52.0 % 24.3(L) 25.1(L) 24.5(L)  Platelets 150 - 400 K/uL 86(L) 92(L) 75(L)   BMP Latest Ref Rng & Units 01/07/2016 01/04/2016 01/03/2016  Glucose 65 - 99 mg/dL 95 110(H) 129(H)  BUN 6 - 20 mg/dL 21(H) 77(H) 83(H)  Creatinine 0.61 - 1.24 mg/dL 0.99 1.17 1.24  Sodium 135 - 145 mmol/L 142 140 140  Potassium 3.5 - 5.1 mmol/L 3.9 3.9 4.5  Chloride 101 - 111 mmol/L 112(H) 113(H) 113(H)  CO2 22 - 32 mmol/L 25 22 20(L)  Calcium  8.9 - 10.3 mg/dL 8.7(L) 8.6(L) 9.1   Results for [name], [name] (MRN [id number]) as of 01/07/2016 08:49  Ref. Range 01/07/2016 06:35  INR Unknown 1.60   No results found.  Assessment/Plan: [name] is a 82 y.o. gentleman with PMH AAA s/p repair 2010/2012, IDA, AS s/p bioprosthetic AVR on Coumadin, chronic Afib, HFpEF (LVEF 55-60% in 08/2014), CAD s/p CABGx5 in 2000, emphysema, gout, carotid stenosis, HTN, HLD, CVA x3, and DJD admitted for syncope and melena.  Active Problems:  GI bleed with acute blood loss anemia, 2-3 days of melena at home, on Warfarin for atrial fibrillation, endorses daily NSAID use, ongoing tobacco and alcohol use. Hemoglobin 8.9 from 12.7 in May 2017. Hemodynamically stable but melanotic stool visible on ED rectal exam and FOBT+. Colonoscopy 5 years ago but has never had EGD, concern for UGIB from PUD or malignancy. Found to have Hb 6.7 and INR 3.8 on 11/21 and received 2 units blood and 2 units FFP, in addition to IV vitamin K. EGD on 11/22 revealed shallow nonbleeding gastric antral ulcers. Cleared to resume home diet and warfarin - Appreciate GI recs - Follow EGD biopsy results - AM CBC - Hb 8.3,  transfuse threshold Hb < 7 - AM INR - 1.60, goal INR <2 for EGD - Trend daily CBC and INR - Protonix 40mg  QD - Warfarin per pharmacy - Hold Aspirin - Avoid NSAIDs  Syncope, may be secondary to hypovolemia with recent increased diuresis, bradycardia (BB dose), arrhythmia, worsening CHF, symptom improvement following IVF resusc via EMS. Intermittent bradycardia visible on tele, may be the source of his syncopal symptoms. Hypotension resolved after IVF resuscitation and hold home medications for 2 days. GIB has stopped. - Telemetry  HFpEF, (LVEF 55-60% in 08/2014 with severe LAE and moderate RAE), weight stable at 184 lbs  - Strict I/Os and daily weights - Resume home Coreg 6.25 mg daily - Resume home Losartan 25 mg daily - Hold home lasix 20 QD PRN - Holding  home Aspirin 81mg   Atrial fibrillation, CHADSVASc score 7, typical INR goal 2-3, ongoing but appears rate-controlled. Intermittent episodes of transient bradycardia overnight concerning for some degree of heart block, may need evaluation for pacemaker or ICD in the future - Telemetry, monitor for RVR - Home Warfarin and Coreg  Cough, chronic secondary to COPD and smoking, likely worsened by inactivity and hypoventilation in hospital, patient as with mild dCHF and we have been holding diuretics.  - Monitor for fever, oxygen requirement - Low threshold for CXR - Consider incentive spirometry and or mucolytic  Constipation, likely secondary to poor po intake and sedentary inpatient level of activity - Activity as tolerated per RN - Senna QD - Miralax QD - Encourage work with PT  Bioprosthetic aortic valve, on chronic Warfarin - Warfarin  HTN -  Coreg and Losartan  Hypothyroidism, TSH of 3.576 - Continue levothyroxine 50mcg qd  Knee osteoarthritis, MSK - Activity as tolerated - PT as tolerated, SNF - Tylenol PRN - Avoid NSAIDs  FEN/GI:  HH diet , replete electrolytes as needed  DVT ppx: SCDs  Dispo: Anticipated discharge in approximately 2-3 day(s). Case management working on SNF placement.   LOS: 3 days   Patrick Partridge, MD 01/07/2016, 8:49 AM Pager: [phone]

## 2016-01-07 NOTE — Progress Notes (Signed)
  Date: 01/07/2016  Patient name: [name]  Medical record number: [id number]  Date of birth: January 08, 1927   I have personally seen and evaluated this patient and the plan of care was discussed with the house staff. Please see Dr. Durenda Age note for complete details. I concur with his findings.   PT to see today.  Hgb stable.  Continue to monitor.  INR is rising and should be at a stable number in next 1-2 days.   Sid Falcon, MD 01/07/2016, 11:20 AM

## 2016-01-07 NOTE — Progress Notes (Signed)
ANTICOAGULATION CONSULT NOTE  Pharmacy Consult for warfarin Indication: atrial fibrillation  No Known Allergies  Patient Measurements: Height: 5\' 10"  (177.8 cm) Weight: 184 lb 11.9 oz (83.8 kg) IBW/kg (Calculated) : 73  Vital Signs: Temp: 97.7 F (36.5 C) (11/24 0612) Temp Source: Oral (11/24 0612) BP: 162/67 (11/24 0803) Pulse Rate: 64 (11/24 0803)  Labs:  Recent Labs  01/05/16 1229 01/06/16 0842 01/07/16 0635  HGB 8.4* 8.6* 8.3*  HCT 24.5* 25.1* 24.3*  PLT 75* 92* 86*  LABPROT 16.8* 17.1* 19.2*  INR 1.35 1.38 1.60  CREATININE  --   --  0.99    Estimated Creatinine Clearance: 54.3 mL/min (by C-G formula based on SCr of 0.99 mg/dL).  Assessment: 82 y/o male on warfarin PTA for Afib, hx stroke, and tissue AVR. Admitted with GIB s/p EGD. GI ok with resuming warfarin at this time and pharmacy consulted to manage. CHADS2VASC is 97 (age, stroke, CHF, HTN, PVD).   INR is subtherapeutic at 1.6 s/p reversal with vitamin K (11/21), PRBC, and FFP. No further bleeding noted at this time. Hgb up to 8's and stable. Platelets noted to be low (has hx platelets <150K dating back to 2012). IMTS recommending to resume warfarin at slightly reduced dose due to elevated INR on admission (3.09). Not eating well which can affect INR.  PTA regimen: 7.5 mg daily except 11.25 mg Mon  Goal of Therapy:  INR 2-3 Monitor platelets by anticoagulation protocol: Yes   Plan:  - Warfarin 7.5 mg PO x1 tonight  - Daily INR - Monitor for s/sx of bleeding - Recommend close outpatient follow-up of INR, could try warfarin 7.5 mg daily at discharge  Renold Genta, PharmD, [place] Pharmacist Phone for today - [place] - [phone] 01/07/2016 9:04 AM

## 2016-01-07 NOTE — Progress Notes (Signed)
SLP Cancellation Note  Patient Details Name: [name] MRN: [id number] DOB: 30-Jun-1926   Cancelled treatment:       Reason Eval/Treat Not Completed: SLP screened, no needs identified, will sign off. MD wishes to cancel order. Pt swallowing well, coughed yesterday due to poor positioning per family and staff.    Shahrukh Pasch, Katherene Ponto 01/07/2016, 8:21 AM

## 2016-01-08 DIAGNOSIS — Z9889 Other specified postprocedural states: Secondary | ICD-10-CM

## 2016-01-08 DIAGNOSIS — Z8679 Personal history of other diseases of the circulatory system: Secondary | ICD-10-CM

## 2016-01-08 LAB — PROTIME-INR
INR: 1.88
Prothrombin Time: 21.9 seconds — ABNORMAL HIGH (ref 11.4–15.2)

## 2016-01-08 LAB — CBC
HEMATOCRIT: 24.7 % — AB (ref 39.0–52.0)
HEMOGLOBIN: 8.5 g/dL — AB (ref 13.0–17.0)
MCH: 34.1 pg — ABNORMAL HIGH (ref 26.0–34.0)
MCHC: 34.4 g/dL (ref 30.0–36.0)
MCV: 99.2 fL (ref 78.0–100.0)
Platelets: 99 10*3/uL — ABNORMAL LOW (ref 150–400)
RBC: 2.49 MIL/uL — ABNORMAL LOW (ref 4.22–5.81)
RDW: 16.2 % — AB (ref 11.5–15.5)
WBC: 4.7 10*3/uL (ref 4.0–10.5)

## 2016-01-08 MED ORDER — WARFARIN SODIUM 7.5 MG PO TABS
7.5000 mg | ORAL_TABLET | Freq: Once | ORAL | Status: AC
  Administered 2016-01-08: 7.5 mg via ORAL
  Filled 2016-01-08: qty 1

## 2016-01-08 MED ORDER — FUROSEMIDE 20 MG PO TABS
20.0000 mg | ORAL_TABLET | Freq: Every day | ORAL | Status: DC
  Administered 2016-01-08 – 2016-01-11 (×4): 20 mg via ORAL
  Filled 2016-01-08 (×4): qty 1

## 2016-01-08 NOTE — Progress Notes (Signed)
Subjective: [name] continues to be in good spirits this morning - no new complaints. Reports having 2 good BMs yesterday although apparently they were dark/black - he had not had a BM since prior to admission so this may simply be from from his initial bleeding event. Hb stable at 8.5 and INR 1.88. Worked well with PT yesterday and obtained exercises that he and his wife plan to do with the help of nursing staff today.   Objective: Vital signs in last 24 hours: Vitals:   01/07/16 1455 01/07/16 2116 01/08/16 0324 01/08/16 0529  BP: (!) 137/53 (!) 155/54  (!) 173/62  Pulse: 71 63  66  Resp: 19 19  20   Temp: 97.4 F (36.3 C) 97.3 F (36.3 C)  97.3 F (36.3 C)  TempSrc: Oral Oral  Oral  SpO2: 100% 100%  100%  Weight:   85.7 kg (188 lb 15 oz)   Height:        Intake/Output Summary (Last 24 hours) at 01/08/16 0854 Last data filed at 01/07/16 1109  Gross per 24 hour  Intake                0 ml  Output              700 ml  Net             -700 ml   Filed Weights   01/05/16 0451 01/06/16 0557 01/08/16 0324  Weight: 83.6 kg (184 lb 4.8 oz) 83.8 kg (184 lb 11.9 oz) 85.7 kg (188 lb 15 oz)   Physical Exam General appearance: Elderly gentleman resting comfortably in bed, in no distress, conversational HENT: Normocephalic, atraumatic Cardiovascular: Irregular rhythm with normal rate, soft ejection murmur, no rubs, gallops Respiratory: CTAB, normal work of breathing Abdomen: BS+, soft, non-tender to palpation, non-distended Extremities: Normal bulk and range of motion, trace edema of calves bilaterally, 2+ peripheral pulses Skin: Warm, dry, intact, scattered solar lentigo Neuro: Alert and oriented Psych: Positive affect  Labs / Imaging / Procedures: CBC Latest Ref Rng & Units 01/08/2016 01/07/2016 01/06/2016  WBC 4.0 - 10.5 K/uL 4.7 4.7 5.2  Hemoglobin 13.0 - 17.0 g/dL 8.5(L) 8.3(L) 8.6(L)  Hematocrit 39.0 - 52.0 % 24.7(L) 24.3(L) 25.1(L)  Platelets 150 - 400 K/uL 99(L) 86(L)  92(L)   BMP Latest Ref Rng & Units 01/07/2016 01/04/2016 01/03/2016  Glucose 65 - 99 mg/dL 95 110(H) 129(H)  BUN 6 - 20 mg/dL 21(H) 77(H) 83(H)  Creatinine 0.61 - 1.24 mg/dL 0.99 1.17 1.24  Sodium 135 - 145 mmol/L 142 140 140  Potassium 3.5 - 5.1 mmol/L 3.9 3.9 4.5  Chloride 101 - 111 mmol/L 112(H) 113(H) 113(H)  CO2 22 - 32 mmol/L 25 22 20(L)  Calcium 8.9 - 10.3 mg/dL 8.7(L) 8.6(L) 9.1   Results for [name], [name] (MRN [id number]) as of 01/08/2016 09:17  Ref. Range 01/08/2016 04:39  INR Unknown 1.88   No results found.  Assessment/Plan: [name] is a 82 y.o. gentleman with PMH AAA s/p repair 2010/2012, IDA, AS s/p bioprosthetic AVR on Coumadin, chronic Afib, HFpEF (LVEF 55-60% in 08/2014), CAD s/p CABGx5 in 2000, emphysema, gout, carotid stenosis, HTN, HLD, CVA x3, and DJD admitted for syncope and melena.  Active Problems:  GI bleed with acute blood loss anemia, 2-3 days of melena at home, on Warfarin for atrial fibrillation, endorses daily NSAID use, ongoing tobacco and alcohol use. Hemoglobin 8.9 from 12.7 in May 2017. Hemodynamically stable but melanotic stool visible on  ED rectal exam and FOBT+. Colonoscopy 5 years ago but has never had EGD, concern for UGIB from PUD or malignancy. Found to have Hb 6.7 and INR 3.8 on 11/21 and received 2 units blood and 2 units FFP, in addition to IV vitamin K. EGD on 11/22 revealed shallow nonbleeding gastric antral ulcers. Cleared to resume home diet and warfarin. - Appreciate GI recs - Follow EGD biopsy results - AM CBC - Hb 8.5, transfuse threshold Hb < 7 - AM INR - 1.88, goal INR <2 for EGD - Trend daily CBC and INR - Protonix 40mg  QD - Warfarin per pharmacy - Hold Aspirin - Avoid NSAIDs  Syncope, may be secondary to hypovolemia with recent increased diuresis, bradycardia (BB dose), arrhythmia, worsening CHF, symptom improvement following IVF resusc via EMS. Intermittent bradycardia visible on tele, may be the source of his  syncopal symptoms. Hypotension resolved after IVF resuscitation and hold home medications for 2 days. GIB has stopped. - Telemetry with intermittent bradycardia to 40s that does not appear to be symptomatic  HFpEF, (LVEF 55-60% in 08/2014 with severe LAE and moderate RAE), weight stable at 184 lbs  - Strict I/Os and daily weights, gained 4 lbs  - Resume home Coreg 6.25 mg daily - Resume home Losartan 25 mg daily - Resume Lasix 20 QD  - Holding home Aspirin 81mg  - Follow BMP tomorrow  Atrial fibrillation, CHADSVASc score 7, typical INR goal 2-3, ongoing but appears rate-controlled. Intermittent episodes of transient bradycardia overnight concerning for some degree of heart block, may need evaluation for pacemaker or ICD in the future - Telemetry, monitor for RVR - Home Warfarin and Coreg  Cough, chronic secondary to COPD and smoking, likely worsened by inactivity and hypoventilation in hospital, patient as with mild dCHF and we have been holding diuretics.  - Monitor for fever, oxygen requirement  Constipation, likely secondary to poor po intake and sedentary inpatient level of activity, resolved with 2 BM on 11/24. - Activity as tolerated per RN - Senna QD - Miralax QD - Encourage work with PT  Bioprosthetic aortic valve, on chronic Warfarin - Warfarin  HTN, persistent hypertension to 160s-170s overnight - Restart home Lasix 20 QD -  Coreg and Losartan  Hypothyroidism, TSH of 3.576 - Continue levothyroxine 6mcg qd  Knee osteoarthritis, MSK - Activity as tolerated - PT as tolerated, SNF - Tylenol PRN - Avoid NSAIDs  FEN/GI:  HH diet , replete electrolytes as needed  DVT ppx: SCDs  Dispo: Anticipated discharge in approximately 2-3 day(s). Case management working on SNF placement.   LOS: 4 days   [name], MD 01/08/2016, 8:54 AM Pager: [phone]

## 2016-01-08 NOTE — Progress Notes (Signed)
  Date: 01/08/2016  Patient name: [name]  Medical record number: [id number]  Date of birth: 1927/01/06   I have personally seen and evaluated this patient and the plan of care was discussed with the house staff. Please see Dr. Durenda Age note for complete details. I concur with his findings.   Mr. Caravantes is doing well.  Awaiting SNF placement for Rehab.   Sid Falcon, MD 01/08/2016, 11:57 AM

## 2016-01-08 NOTE — Progress Notes (Signed)
Pt's AM BP was 173/62 and asymptomatic. Resident MD on call was notified and no new orders were given. MD on call says she would pass it on to the day team- primary MD. Pt is asleep with no complaints. Will continue to monitor.

## 2016-01-08 NOTE — Progress Notes (Signed)
ANTICOAGULATION CONSULT NOTE  Pharmacy Consult for warfarin Indication: atrial fibrillation  No Known Allergies  Patient Measurements: Height: 5\' 10"  (177.8 cm) Weight: 188 lb 15 oz (85.7 kg) IBW/kg (Calculated) : 73  Vital Signs: Temp: 97.3 F (36.3 C) (11/25 0529) Temp Source: Oral (11/25 0529) BP: 173/62 (11/25 0529) Pulse Rate: 66 (11/25 0529)  Labs:  Recent Labs  01/06/16 0842 01/07/16 0635 01/08/16 0439  HGB 8.6* 8.3* 8.5*  HCT 25.1* 24.3* 24.7*  PLT 92* 86* 99*  LABPROT 17.1* 19.2* 21.9*  INR 1.38 1.60 1.88  CREATININE  --  0.99  --     Estimated Creatinine Clearance: 54.3 mL/min (by C-G formula based on SCr of 0.99 mg/dL).  Assessment: 82 y/o male on warfarin PTA for Afib, hx stroke, and tissue AVR. Admitted with GIB s/p EGD. GI ok with resuming warfarin on 11/22 and pharmacy consulted to manage. CHADS2VASC is 102 (age, stroke, CHF, HTN, PVD).   INR is subtherapeutic at 1.88 s/p reversal with vitamin K (11/21), PRBC, and FFP. No further bleeding noted at this time. Hgb up to 8's and stable. Platelets noted to be low (has hx platelets <150K dating back to 2012). IMTS recommending to resume warfarin at slightly reduced dose due to elevated INR on admission (3.09). Not eating well which can affect INR (~25% of meals). No bleeding noted per chart.   PTA regimen: 7.5 mg daily except 11.25 mg Mon  Goal of Therapy:  INR 2-3 Monitor platelets by anticoagulation protocol: Yes   Plan:  - Repeat  Warfarin 7.5 mg PO x1 tonight  - Daily INR/ CBC q 72 hours  - Monitor for s/sx of bleeding - Recommend close outpatient follow-up of INR, could try warfarin 7.5 mg daily at discharge  Uvaldo Bristle, PharmD PGY1 Pharmacy Resident Pager: [phone]  01/08/2016 11:05 AM

## 2016-01-09 DIAGNOSIS — K922 Gastrointestinal hemorrhage, unspecified: Secondary | ICD-10-CM

## 2016-01-09 DIAGNOSIS — E039 Hypothyroidism, unspecified: Secondary | ICD-10-CM

## 2016-01-09 DIAGNOSIS — I482 Chronic atrial fibrillation: Secondary | ICD-10-CM

## 2016-01-09 DIAGNOSIS — Z952 Presence of prosthetic heart valve: Secondary | ICD-10-CM

## 2016-01-09 LAB — CBC
HCT: 24.5 % — ABNORMAL LOW (ref 39.0–52.0)
Hemoglobin: 8.3 g/dL — ABNORMAL LOW (ref 13.0–17.0)
MCH: 33.3 pg (ref 26.0–34.0)
MCHC: 33.9 g/dL (ref 30.0–36.0)
MCV: 98.4 fL (ref 78.0–100.0)
PLATELETS: 103 10*3/uL — AB (ref 150–400)
RBC: 2.49 MIL/uL — AB (ref 4.22–5.81)
RDW: 15.8 % — ABNORMAL HIGH (ref 11.5–15.5)
WBC: 4.6 10*3/uL (ref 4.0–10.5)

## 2016-01-09 LAB — BASIC METABOLIC PANEL
ANION GAP: 6 (ref 5–15)
BUN: 11 mg/dL (ref 6–20)
CO2: 26 mmol/L (ref 22–32)
Calcium: 8.6 mg/dL — ABNORMAL LOW (ref 8.9–10.3)
Chloride: 107 mmol/L (ref 101–111)
Creatinine, Ser: 0.95 mg/dL (ref 0.61–1.24)
GFR calc Af Amer: >60 mL/min (ref >60)
Glucose, Bld: 95 mg/dL (ref 65–99)
POTASSIUM: 3.6 mmol/L (ref 3.5–5.1)
SODIUM: 139 mmol/L (ref 135–145)

## 2016-01-09 LAB — PROTIME-INR
INR: 2.31
PROTHROMBIN TIME: 25.8 s — AB (ref 11.4–15.2)

## 2016-01-09 MED ORDER — COLCHICINE 0.6 MG PO TABS
1.2000 mg | ORAL_TABLET | Freq: Once | ORAL | Status: AC
  Administered 2016-01-09: 1.2 mg via ORAL
  Filled 2016-01-09: qty 2

## 2016-01-09 MED ORDER — COLCHICINE 0.6 MG PO TABS
0.6000 mg | ORAL_TABLET | Freq: Once | ORAL | Status: AC
  Administered 2016-01-09: 0.6 mg via ORAL
  Filled 2016-01-09: qty 1

## 2016-01-09 MED ORDER — WARFARIN SODIUM 5 MG PO TABS
5.0000 mg | ORAL_TABLET | Freq: Once | ORAL | Status: AC
  Administered 2016-01-09: 5 mg via ORAL
  Filled 2016-01-09: qty 1

## 2016-01-09 NOTE — Progress Notes (Signed)
Pt BP elevated, MD notified 

## 2016-01-09 NOTE — Progress Notes (Signed)
ANTICOAGULATION CONSULT NOTE  Pharmacy Consult for warfarin Indication: atrial fibrillation  No Known Allergies  Patient Measurements: Height: 5\' 10"  (177.8 cm) Weight: 184 lb 8.4 oz (83.7 kg) IBW/kg (Calculated) : 73  Vital Signs: Temp: 98.4 F (36.9 C) (11/26 0449) Temp Source: Oral (11/26 0449) BP: 167/59 (11/26 0600) Pulse Rate: 65 (11/26 0600)  Labs:  Recent Labs  01/07/16 0635 01/08/16 0439 01/09/16 0534  HGB 8.3* 8.5* 8.3*  HCT 24.3* 24.7* 24.5*  PLT 86* 99* 103*  LABPROT 19.2* 21.9* 25.8*  INR 1.60 1.88 2.31  CREATININE 0.99  --  0.95    Estimated Creatinine Clearance: 56.6 mL/min (by C-G formula based on SCr of 0.95 mg/dL).  Assessment: 82 y/o male on warfarin PTA for Afib, hx stroke, and tissue AVR. Admitted with GIB s/p EGD. GI ok with resuming warfarin on 11/22 and pharmacy consulted to manage. CHADS2VASC is 6 (age, stroke, CHF, HTN, PVD).   INR is therapeutic at 2.31 s/p reversal with vitamin K (11/21), PRBC, and FFP.Patient had 2 dark stools yesterday but no active bleeding was noted.   Hgb up to 8's and stable. Platelets noted to be low (has hx platelets <150K dating back to 2012). Patient will get lower doses due to elevated INR on admission (3.09).   Will give lower dose tonight due to jump in INR- likely due to Vitamin K clearing. Patient's PO intake is improving which should help maintain INR.   PTA regimen: 7.5 mg daily except 11.25 mg Mon  Goal of Therapy:  INR 2-3 Monitor platelets by anticoagulation protocol: Yes   Plan:  - Warfarin 5 mg x 1 tonight  - Daily INR/ CBC q 72 hours  - Monitor for s/sx of bleeding - Recommend close outpatient follow-up of INR, could try warfarin 7.5 mg daily at discharge   Uvaldo Bristle, PharmD PGY1 Pharmacy Resident Pager: [phone]  01/09/2016 10:57 AM

## 2016-01-09 NOTE — Progress Notes (Signed)
Subjective:  Pt complaining of pain on the medial side of right toe, likely podagra. Says he has had gout in the past. Last gout attack was 5 years ago. Gout has manifested in the knees, and this is the first time the toe is affected. Was taking colchicine in the past. Not on allopurinol. Describes pain as sharp.   Patient has not had further bowel movements today. hgB has been stable. INR has been therapeutic today   Objective: Vital signs in last 24 hours: Vitals:   01/09/16 0300 01/09/16 0449 01/09/16 0449 01/09/16 0600  BP:  (!) 167/57 (!) 167/57 (!) 167/59  Pulse:  64 64 65  Resp:  17 17   Temp:  98.4 F (36.9 C) 98.4 F (36.9 C)   TempSrc:  Oral Oral   SpO2:  100% 100%   Weight: 184 lb 8.4 oz (83.7 kg)     Height:        Intake/Output Summary (Last 24 hours) at 01/09/16 0840 Last data filed at 01/09/16 0452  Gross per 24 hour  Intake            835.5 ml  Output             1200 ml  Net           -364.5 ml   Filed Weights   01/06/16 0557 01/08/16 0324 01/09/16 0300  Weight: 184 lb 11.9 oz (83.8 kg) 188 lb 15 oz (85.7 kg) 184 lb 8.4 oz (83.7 kg)   Physical Exam General appearance: Elderly gentleman resting comfortably in bed, in no distress, wife at bedside  HENT: Normocephalic, atraumatic Cardiovascular: regular rate and rhythm  Respiratory: CTAB, normal work of breathing Abdomen: BS+, soft, non-tender to palpation, non-distended Extremities: Normal bulk and range of motion, No pitting edema. Pulses intact b/l dp. Right medial toe is distinctly tender to touch and erythematous. No significant warmth or effusion appreciated.  Skin: Warm, dry, intact, scattered solar lentigo  Labs / Imaging / Procedures: CBC Latest Ref Rng & Units 01/09/2016 01/08/2016 01/07/2016  WBC 4.0 - 10.5 K/uL 4.6 4.7 4.7  Hemoglobin 13.0 - 17.0 g/dL 8.3(L) 8.5(L) 8.3(L)  Hematocrit 39.0 - 52.0 % 24.5(L) 24.7(L) 24.3(L)  Platelets 150 - 400 K/uL 103(L) 99(L) 86(L)   BMP Latest Ref Rng  & Units 01/09/2016 01/07/2016 01/04/2016  Glucose 65 - 99 mg/dL 95 95 110(H)  BUN 6 - 20 mg/dL 11 21(H) 77(H)  Creatinine 0.61 - 1.24 mg/dL 0.95 0.99 1.17  Sodium 135 - 145 mmol/L 139 142 140  Potassium 3.5 - 5.1 mmol/L 3.6 3.9 3.9  Chloride 101 - 111 mmol/L 107 112(H) 113(H)  CO2 22 - 32 mmol/L 26 25 22   Calcium 8.9 - 10.3 mg/dL 8.6(L) 8.7(L) 8.6(L)     No results found.  Assessment/Plan: [name] is a 82 y.o. gentleman with PMH AAA s/p repair 2010/2012, IDA, AS s/p bioprosthetic AVR on Coumadin, chronic Afib, HFpEF (LVEF 55-60% in 08/2014), CAD s/p CABGx5 in 2000, emphysema, gout, carotid stenosis, HTN, HLD, CVA x3, and DJD admitted for syncope and melena.  Active Problems:  Right toe pain, likely podagra: Says he has had gout in the past. Last gout attack was 5 years ago. Gout has manifested in the knees, and this is the first time the toe is affected. Was taking colchicine in the past. Not on allopurinol. Describes pain as sharp. He is able to move his toes in all direction. Unlikely to be DVT or blood  clot as therapeutic INR.   -try colchicine- given 1.2 mg colchicine at 9 AM, and followed by 0.6 mg colchicine -will avoid NSAIDs given GIB -obtain uric acid level in the intercritical period   GI bleed with acute blood loss anemia: Over last two days, patient's hemoglobin has been stable at 8.2. He had 2 bowel movements yesterday morning which he describes it as dark. Had another small one in the evening which was not dark.  His INR is therapeutic today.   -awaiting SNF placement  -will repeat CBC before discharge as well as INR -coumadin per pharm -protonix -avoid NSAIDs and holding aspirin  HFpEF, (LVEF 55-60% in 08/2014 with severe LAE and moderate RAE), weight stable at 184 lbs   - Coreg 6.25 mg daily - Losartan 25 mg daily - Lasix 20 QD  - Holding home Aspirin 81mg  - Follow BMP tomorrow  Atrial fibrillation, CHADSVASc score 7, INR goal 2-3, ongoing but  rate-controlled. INR is therapeutic today   -coumadin per pharm -coreg  HTN, mildly hypertensive to 167/59, pain likely contributing factor  -  home Lasix 20 QD -  Coreg and Losartan   FEN/GI:  HH diet , replete electrolytes as needed  DVT ppx: SCDs, coumadin  Dispo: Anticipated discharge in approximately 1 day(s). Case management working on SNF placement.   LOS: 5 days   Burgess Estelle, MD 01/09/2016, 8:40 AM Pager: [phone]

## 2016-01-09 NOTE — Progress Notes (Signed)
  Date: 01/09/2016  Patient name: [name]  Medical record number: [id number]  Date of birth: 14-Jul-1926   I have personally seen and evaluated Mr. Perreault and the plan of care was discussed with the house staff. Please see Dr. Sherlynn Carbon note for complete details. I concur with his findings with the following additions/corrections:   Agree that his right foot pain may be related to gout.  He has warmth, erythema and pain over the left little toe, fourth toe and lateral edge of foot.  It is a somewhat atypical place for gout, but the symptoms are consistent.  He has good pulses.  Colchicine will be trialed for him.  If no improvement, or worsening skin changes would consider further work up for another cause.  He was admitted with GI bleeding.  He has been stable for a few days and his INR is back to goal.  He is going to SNF for rehab which is being arranged.   Sid Falcon, MD 01/09/2016, 12:23 PM

## 2016-01-09 NOTE — Progress Notes (Signed)
Pt c/o pain of right foot, redness of bottom and top of foot noted on assessment. MD notified. MD will assess pt.

## 2016-01-10 DIAGNOSIS — M109 Gout, unspecified: Secondary | ICD-10-CM

## 2016-01-10 DIAGNOSIS — M10071 Idiopathic gout, right ankle and foot: Secondary | ICD-10-CM

## 2016-01-10 DIAGNOSIS — D696 Thrombocytopenia, unspecified: Secondary | ICD-10-CM

## 2016-01-10 DIAGNOSIS — K5900 Constipation, unspecified: Secondary | ICD-10-CM

## 2016-01-10 LAB — BASIC METABOLIC PANEL
Anion gap: 6 (ref 5–15)
BUN: 15 mg/dL (ref 6–20)
CHLORIDE: 107 mmol/L (ref 101–111)
CO2: 26 mmol/L (ref 22–32)
CREATININE: 0.96 mg/dL (ref 0.61–1.24)
Calcium: 8.5 mg/dL — ABNORMAL LOW (ref 8.9–10.3)
GFR calc Af Amer: >60 mL/min (ref >60)
GFR calc non Af Amer: >60 mL/min (ref >60)
GLUCOSE: 97 mg/dL (ref 65–99)
POTASSIUM: 3.5 mmol/L (ref 3.5–5.1)
SODIUM: 139 mmol/L (ref 135–145)

## 2016-01-10 LAB — PROTIME-INR
INR: 2.67
PROTHROMBIN TIME: 29 s — AB (ref 11.4–15.2)

## 2016-01-10 LAB — CBC
HEMATOCRIT: 23.7 % — AB (ref 39.0–52.0)
HEMOGLOBIN: 8 g/dL — AB (ref 13.0–17.0)
MCH: 33.2 pg (ref 26.0–34.0)
MCHC: 33.8 g/dL (ref 30.0–36.0)
MCV: 98.3 fL (ref 78.0–100.0)
Platelets: 122 10*3/uL — ABNORMAL LOW (ref 150–400)
RBC: 2.41 MIL/uL — AB (ref 4.22–5.81)
RDW: 15.6 % — ABNORMAL HIGH (ref 11.5–15.5)
WBC: 4.5 10*3/uL (ref 4.0–10.5)

## 2016-01-10 MED ORDER — WARFARIN SODIUM 5 MG PO TABS
5.0000 mg | ORAL_TABLET | Freq: Once | ORAL | Status: AC
Start: 2016-01-10 — End: 2016-01-10
  Administered 2016-01-10: 5 mg via ORAL
  Filled 2016-01-10: qty 1

## 2016-01-10 MED ORDER — COLCHICINE 0.6 MG PO TABS
0.6000 mg | ORAL_TABLET | Freq: Every day | ORAL | Status: DC
  Administered 2016-01-10 – 2016-01-11 (×2): 0.6 mg via ORAL
  Filled 2016-01-10 (×2): qty 1

## 2016-01-10 NOTE — Progress Notes (Signed)
ANTICOAGULATION CONSULT NOTE  Pharmacy Consult for warfarin Indication: atrial fibrillation  No Known Allergies  Patient Measurements: Height: 5\' 10"  (177.8 cm) Weight: 189 lb 2.5 oz (85.8 kg) IBW/kg (Calculated) : 73  Vital Signs: Temp: 97.7 F (36.5 C) (11/27 0544) Temp Source: Oral (11/27 0544) BP: 151/56 (11/27 0544) Pulse Rate: 64 (11/27 0544)  Labs:  Recent Labs  01/08/16 0439 01/09/16 0534 01/10/16 0657  HGB 8.5* 8.3* 8.0*  HCT 24.7* 24.5* 23.7*  PLT 99* 103* 122*  LABPROT 21.9* 25.8* 29.0*  INR 1.88 2.31 2.67  CREATININE  --  0.95 0.96    Estimated Creatinine Clearance: 56 mL/min (by C-G formula based on SCr of 0.96 mg/dL).  Assessment: 82 y/o male on warfarin PTA for Afib, hx stroke, and tissue AVR. Admitted with GIB, s/p EGD. GI ok with resuming warfarin on 11/22 and pharmacy consulted to manage. CHADS2VASC is 38 (age, stroke, CHF, HTN, PVD).   INR is therapeutic at 2.67 up from  2.31. s/p reversal with vitamin K (11/21), PRBC, and FFP. Patient had 2 dark stools 11/25 am but no active bleeding was noted. Had a small BM 11/25 pm that was not dark.  Hgb up to 8's but trending down. Platelets noted to be low but improving (has hx platelets <150K dating back to 2012). INR elevated on admission (3.09), possible need for lower maintenance dose. Aspirin has been on hold.  Patient's PO intake improved which should help maintain INR.   PTA regimen: 7.5 mg daily except 11.25 mg Mon  Goal of Therapy:  INR 2-3 Monitor platelets by anticoagulation protocol: Yes   Plan:  - Warfarin 5 mg x 1 tonight  - Daily INR/CBC - Monitor s/sx of bleeding, diet  Cheri Rous PharmD Candidate 01/10/2016 3:29 PM

## 2016-01-10 NOTE — Care Management Important Message (Signed)
Important Message  Patient Details  Name: [name] MRN: [id number] Date of Birth: [birth year]   Medicare Important Message Given:  Yes    Nathen May 01/10/2016, 12:05 PM

## 2016-01-10 NOTE — Progress Notes (Signed)
Physical Therapy Treatment Patient Details Name: [name] MRN: [id number] DOB: 27-Nov-1926 Today's Date: 01/10/2016    History of Present Illness [name], [name] is a 82 yo M with a significant PMH of atrial fibrillation, currently on warfarin, Aortic Stenosis s/p tissue AVR 10/2010, HFpEF (echo 08/2014), CAD s/p CABG 5 vessel 2000, AAA s/p endograft who presented to the EMS with syncope. The patient states he became very weak when he tried to come back into his house after finishing his beer and cigar on the porch. Pt found to have a GI bleed    PT Comments    The pt is still requiring assistance with transfers and gait due to increased pain in R foot and decreased strength and endurance.  Pt will benefit from further PT to facilitate as much functional progress as possible before going home.  Family present and in agreement of SNF recommendation.  Continue with POC.  Follow Up Recommendations  SNF;Supervision/Assistance - 24 hour     Equipment Recommendations  None recommended by PT    Recommendations for Other Services       Precautions / Restrictions Precautions Precautions: Fall Precaution Comments: freq falls Restrictions Weight Bearing Restrictions: No    Mobility  Bed Mobility               General bed mobility comments: pt OOB in chair upon arrival.  Transfers Overall transfer level: Needs assistance Equipment used: Rolling walker (2 wheeled) Transfers: Sit to/from Stand Sit to Stand: Mod assist         General transfer comment: Mod A to power up from chair.  Verbal cues for hand placement, foot placement, and to scoot to edge of chair and lean forward.  Ambulation/Gait Ambulation/Gait assistance: Min assist Ambulation Distance (Feet): 75 Feet Assistive device: Rolling walker (2 wheeled) Gait Pattern/deviations: Step-through pattern;Decreased stance time - right;Antalgic;Trunk flexed Gait velocity: decreased Gait velocity interpretation: Below  normal speed for age/gender General Gait Details: Pt with flexed trunk, verbal and manual cues to stay inside of RW, Verbal cues for upright posture, assistance to  steer RW around obstacles.    Stairs            Wheelchair Mobility    Modified Rankin (Stroke Patients Only)       Balance     Sitting balance-Leahy Scale: Good       Standing balance-Leahy Scale: Fair Standing balance comment: Requires RW for standing balance.                    Cognition Arousal/Alertness: Awake/alert Behavior During Therapy: WFL for tasks assessed/performed Overall Cognitive Status: Within Functional Limits for tasks assessed                      Exercises      General Comments        Pertinent Vitals/Pain Pain Assessment: Faces Faces Pain Scale: Hurts little more Pain Location: R foot Pain Descriptors / Indicators: Grimacing;Aching Pain Intervention(s): Limited activity within patient's tolerance;Monitored during session    Home Living                      Prior Function            PT Goals (current goals can now be found in the care plan section) Acute Rehab PT Goals Patient Stated Goal: get better PT Goal Formulation: With patient Time For Goal Achievement: 01/11/16 Potential to Achieve Goals: Good Progress towards  PT goals: Progressing toward goals    Frequency    Min 3X/week      PT Plan Current plan remains appropriate    Co-evaluation             End of Session Equipment Utilized During Treatment: Gait belt Activity Tolerance: Patient limited by pain;Patient limited by fatigue Patient left: in chair;with call bell/phone within reach;with family/visitor present (Family declined for chair alarm to be set.)     Time: 1257-1315 PT Time Calculation (min) (ACUTE ONLY): 18 min  Charges:  $Gait Training: 8-22 mins                    G Codes:      Bary Castilla 01/11/16, 2:04 PM Rito Ehrlich. [place], [place]

## 2016-01-10 NOTE — Progress Notes (Signed)
Subjective: [name] continues to feel well today and has no complaints this morning. He and his family are curious about the possibility of leaving for SNF today and report that they did not hear from [facility] since last Wednesday. We discussed this and planned to touch base with CSW promptly. [name] reports no BMs since 11/25. He reports improvement in the pain/swelling of his right toe and ankle since starting colchicine yesterday, attributed to gout.   Objective: Vital signs in last 24 hours: Vitals:   01/09/16 0600 01/09/16 1547 01/09/16 2205 01/10/16 0544  BP: (!) 167/59 (!) 154/59 (!) 158/55 (!) 151/56  Pulse: 65 71 66 64  Resp:  17 20 20   Temp:  98.7 F (37.1 C) 97.5 F (36.4 C) 97.7 F (36.5 C)  TempSrc:  Oral Oral Oral  SpO2:  99% 100% 96%  Weight:    85.8 kg (189 lb 2.5 oz)  Height:        Intake/Output Summary (Last 24 hours) at 01/10/16 [id number] Last data filed at 01/10/16 [id number]  Gross per 24 hour  Intake                0 ml  Output               80 ml  Net              -80 ml   Filed Weights   01/08/16 0324 01/09/16 0300 01/10/16 0544  Weight: 85.7 kg (188 lb 15 oz) 83.7 kg (184 lb 8.4 oz) 85.8 kg (189 lb 2.5 oz)   Physical Exam General appearance: Elderly gentleman resting comfortably in bed, in no distress, wife at bedside  HENT: Normocephalic, atraumatic Cardiovascular: Irregular rhythm, normal rate, soft ejection murmur, no rubs/gallops Respiratory: CTAB, normal work of breathing Abdomen: BS+, soft, non-tender to palpation, non-distended Extremities: Normal bulk and range of motion, No pitting edema. Right medial big toe tender to touch and mildly erythematous. No significant warmth or effusion appreciated.  Skin: Warm, dry, intact, scattered solar lentigo  Labs / Imaging / Procedures: CBC Latest Ref Rng & Units 01/09/2016 01/08/2016 01/07/2016  WBC 4.0 - 10.5 K/uL 4.6 4.7 4.7  Hemoglobin 13.0 - 17.0 g/dL 8.3(L) 8.5(L) 8.3(L)  Hematocrit 39.0 - 52.0 %  24.5(L) 24.7(L) 24.3(L)  Platelets 150 - 400 K/uL 103(L) 99(L) 86(L)   BMP Latest Ref Rng & Units 01/09/2016 01/07/2016 01/04/2016  Glucose 65 - 99 mg/dL 95 95 110(H)  BUN 6 - 20 mg/dL 11 21(H) 77(H)  Creatinine 0.61 - 1.24 mg/dL 0.95 0.99 1.17  Sodium 135 - 145 mmol/L 139 142 140  Potassium 3.5 - 5.1 mmol/L 3.6 3.9 3.9  Chloride 101 - 111 mmol/L 107 112(H) 113(H)  CO2 22 - 32 mmol/L 26 25 22   Calcium 8.9 - 10.3 mg/dL 8.6(L) 8.7(L) 8.6(L)   No results found.  Assessment/Plan: [name] is a 82 y.o. gentleman with PMH AAA s/p repair 2010/2012, IDA, AS s/p bioprosthetic AVR on Coumadin, chronic Afib, HFpEF (LVEF 55-60% in 08/2014), CAD s/p CABGx5 in 2000, emphysema, gout, carotid stenosis, HTN, HLD, CVA x3, and DJD admitted for syncope and melena.  Active Problems:  Right foot pain, likely gout/podagra - Last gout attack was 5 yr ago, previously in knees. Was taking colchicine in the past. Not on allopurinol. Started Colchicine on 11/26, now with symptom improvement  - 0.6 mg colchicine daily for at least one week - will avoid NSAIDs/indomethicin given GIB - obtain uric acid level  in the intercritical period  GI bleed with acute blood loss anemia: Over last two days, patient's hemoglobin has been stable at 8.0. He had 2 bowel movements on 11/25 which he described as dark. Had another small one that evening which was not dark.  His INR is therapeutic today at 2.67 today.  -awaiting SNF placement -will repeat CBC before discharge as well as INR -coumadin per pharm -protonix -avoid NSAIDs and holding aspirin  HFpEF, (LVEF 55-60% in 08/2014 with severe LAE and moderate RAE), weight stable at 184 lbs   - Coreg 6.25 mg daily - Losartan 25 mg daily - Lasix 20 QD  - Holding home Aspirin 81mg   Atrial fibrillation, CHADSVASc score 7, INR goal 2-3, ongoing but rate-controlled. INR is therapeutic today. - Coumadin per pharm - Coreg  HTN, mildly hypertensive to 167/59, pain  likely contributing factor  -  Home Lasix 20 QD - Coreg and Losartan  Deconditioning - Continue physical therapy treatment sessions as tolerated  FEN/GI:  HH diet , replete electrolytes as needed  DVT ppx: Coumadin  Dispo: Anticipated discharge tomorrow. Case management working on SNF placement, insurance authorization did not go through today.   LOS: 6 days   Patrick Partridge, MD 01/10/2016, 6:43 AM Pager: [phone]

## 2016-01-10 NOTE — Progress Notes (Signed)
Medicine attending: I examined this patient today together with resident physician Dr. [name] and I concur with his evaluation and management plan which will be detailed in his discharge summary. His hemoglobin has been stable without further need for transfusion. Currently 8 g. Platelet count has increased to a low of 75,000 to today's value of 122,000. He is back on anticoagulation with warfarin. Day #5. INR 2.7. Currently on 5 mg daily. He had an apparent gout attack involving his right ankle yesterday. Colchicine was started. Significant relief of symptoms today. I was able to move his ankle and he did not experience any pain. Wife is present as well as his daughter. We are waiting for a skilled nursing facility bed to open. Patient otherwise stable for discharge.

## 2016-01-10 NOTE — Progress Notes (Addendum)
CSW faxed additional PT notes to [facility] to continue [place]- no authorization at this time.  CSW spoke to patient wife about plan for pt- wife agreeable to take patient home to await authorization if stable for DC prior to insurance prior auth being received but hopeful for pt to go straight to SNF- would be concerned about safety getting patient around the home by herself  CSW continuing to follow  Jorge Ny, [place] Worker [phone]

## 2016-01-10 NOTE — Progress Notes (Signed)
Subjective: Patient is well appearing lying in bed, conversant with team and family. Patient denies any new BMs.  Patient is ready for discharge.  Objective: Vital signs in last 24 hours: Vitals:   01/09/16 0600 01/09/16 1547 01/09/16 2205 01/10/16 0544  BP: (!) 167/59 (!) 154/59 (!) 158/55 (!) 151/56  Pulse: 65 71 66 64  Resp:  17 20 20   Temp:  98.7 F (37.1 C) 97.5 F (36.4 C) 97.7 F (36.5 C)  TempSrc:  Oral Oral Oral  SpO2:  99% 100% 96%  Weight:    85.8 kg (189 lb 2.5 oz)  Height:       Weight change: 2.1 kg (4 lb 10.1 oz)  Intake/Output Summary (Last 24 hours) at 01/10/16 1530 Last data filed at 01/10/16 1029  Gross per 24 hour  Intake              300 ml  Output               80 ml  Net              220 ml   General appearance: alert, oriented x4, no distress and pale HEENT: normocephalic and atruamatic PERRL. MMM. Anicteric. No oral lesions present.  Cv: Sternal scar present. Irregular rate and rhythm. Nl s1 s2. Early peaking III/VI Systolic murmur radiating to both carotids loudest in upper sternal borders. Distal pulses 2+. Warm extremities. No LE edema. Resp: No appearent scars on back. NWB. CTAB.  Abdomen: NBS. Slightly more distended and tender at epigastric. No rebound.  Rectal: Deferred 2/2 to ED eval - Black stool on rectal exam  Neurologic: Facial movements symmetric. No dysarthria. Uvula and tongue midline. EOM intact. No dysphagia or dysphonia.  Psych: Euthymic affect and appropriate mood Lab Results: H/H 8/23.7 and pt of 122. INR 2.67. BMP wnl Studies/Results: CXR showed no pleural effusions. Aortic calcifications on the aortic notch. No cardiomegaly. No cephalization or kerley B lines. Sternal barbs present. Ribs without fracture.  ECG: no change from prior strip in 06/2015. Not sinus rhythm. Irregular rate with no discernible p waves. QRS is >128ms. QT wnl. Normal axis. No evidence of ST elevations or depressions. No path q present.   Medications: I  have reviewed the patient's current medications. Scheduled Meds: . carvedilol  3.125 mg Oral BID WC  . colchicine  0.6 mg Oral Daily  . cyanocobalamin  1,000 mcg Intramuscular Q30 days  . furosemide  20 mg Oral Daily  . latanoprost  1 drop Both Eyes QHS  . levothyroxine  25 mcg Oral QAC breakfast  . losartan  25 mg Oral Daily  . pantoprazole  40 mg Oral Daily  . polyethylene glycol  17 g Oral Daily  . senna  1 tablet Oral Daily  . sodium chloride flush  3 mL Intravenous Q12H  . warfarin  5 mg Oral ONCE-1800  . Warfarin - Pharmacist Dosing Inpatient   Does not apply q1800   Continuous Infusions: . lactated ringers 10 mL/hr at 01/10/16 0627   PRN Meds:.acetaminophen **OR** acetaminophen Assessment/Plan: Principal Problem:   Gastrointestinal hemorrhage Active Problems:   Chronic atrial fibrillation (HCC)   Hypothyroidism   S/P AVR (aortic valve replacement)   CKD stage G2/A2, GFR 60-89 and albumin creatinine ratio 30-299 mg/g   Hx of CABG   History of completed stroke   Status post abdominal aortic aneurysm (AAA) repair   Gout attack   Constipation   Thrombocytopenia (HCC) ROS  [name], vincient is a  82 yo M with a significant PMH of atrial fibrillation currently on warfarin, h/o stroke, Aortic Stenosis s/p tissue AVR [id number], Diastolic HFpEF, CAD s/p CAPG 5 vessel 2000, AAA s/p endograft who presented to the ED with syncope and melena with a significant fall in hgb from baseline consistent with a GI bleed.  Upper GI Bleed The patient's three day history of black stools, weakness, orthostatic syncope, everyday use of Aleve, 3 drinks of ETOH per day, and currently warfarin most likely points to a upper GI bleed from gastritis. The decrease in H/H from 12.7/35.6>8.9/25.9, positive fecal occult blood, elevated BUN, and pallor on exam is suggestive of this as well. The patient was initially started on IV pantoprazole and IV fluids. Upper EGD was withheld initially due to an elevated INR.  After 2xRPCs and 2xFFP, H/H and INR improved and Upper EGD showed: Z-line regular, 38 cm from the incisors. 5 cm hiatal hernia. Non-bleeding erosive gastropathy. Biopsy was negative for H. Pylori. The patient was then restarted on his warfarin with a therapeutic goal INR of 2-3   - INR of 2.67  - cbc qd   - PT/INR qd - Pantoprazole 40mg  qd oral - Continue Warfarin  - StopASA - Stop NSAIDS - Off tele  - dc tomorrow  Syncope Preceded by weakness, and most likely 2/2 to bleed. Although the exam showed MMM, his recent increase to lasix 40mg  qd may have contributed his orthostatic syncope as suggested by hyaline casts in urine sed and elevated BUN/Cr ratio>20. The patient had no episodes of syncope while in the hospital.  - The patient was monitored on telemetry which recorded atrial fibrillation and some bouts of bradycardia in the 40s overnight, but remained asymptomatic.  - After fluid resuscitation and pRBCs, patient no longer complained of weakness.   Atrial fibrillationPatient remained in a fib presently but also bradycardic. CHADSVASC score of 7.    - Warfarin   - Carvedilol [id number] mg  Diastolic HFpEF No signs of volume overload. Most recent echo 08/2014 could not evaluate diastolic function   - losartan 25mg  qd  - furosemide 20 mg qd  HTN Patient had soft pressures at arrival. Patient became hypertensive to 160s overnight and hypotensive after the procedure.  - Carvedilol as above - furosemide as above - losartan as above  Hypothyroidism - TSH of 3.576 - continue levothyroxine 32mcg qd  Right Knee Arthritis - Hold aleve   - PRN acetaminophen 650mg  q6  Gout  Patient complained of Griffith of the medial side of the right tow. The patient responded to colchicine the next day. The patient was told to continue colchicine for 7 days and follow up with outpatient for further evaluation.    - Colchicine 0.6 mg qd for 7 days  FEN/GI - HH diet  - PEG 17g qd  -  senna 8.6 mg qd - DVT prophylaxis: SCDs 2/2 to bleed  Access - pIV x1  Resuscitation - DNR  This is a Careers information officer Note.  The care of the patient was discussed with Dr. Wynetta Emery and the assessment and plan formulated with their assistance.  Please see their attached note for official documentation of the daily encounter.   LOS: 6 days   Benn Moulder, Medical Student 01/10/2016, 3:30 PM

## 2016-01-11 DIAGNOSIS — D62 Acute posthemorrhagic anemia: Secondary | ICD-10-CM

## 2016-01-11 LAB — CBC
HEMATOCRIT: 24.2 % — AB (ref 39.0–52.0)
HEMOGLOBIN: 8.4 g/dL — AB (ref 13.0–17.0)
MCH: 34.3 pg — AB (ref 26.0–34.0)
MCHC: 34.7 g/dL (ref 30.0–36.0)
MCV: 98.8 fL (ref 78.0–100.0)
Platelets: 124 10*3/uL — ABNORMAL LOW (ref 150–400)
RBC: 2.45 MIL/uL — ABNORMAL LOW (ref 4.22–5.81)
RDW: 15.5 % (ref 11.5–15.5)
WBC: 3.9 10*3/uL — ABNORMAL LOW (ref 4.0–10.5)

## 2016-01-11 LAB — BASIC METABOLIC PANEL
Anion gap: 6 (ref 5–15)
BUN: 15 mg/dL (ref 6–20)
CALCIUM: 8.8 mg/dL — AB (ref 8.9–10.3)
CHLORIDE: 109 mmol/L (ref 101–111)
CO2: 26 mmol/L (ref 22–32)
CREATININE: 1.12 mg/dL (ref 0.61–1.24)
GFR calc non Af Amer: 57 mL/min — ABNORMAL LOW (ref >60)
GLUCOSE: 96 mg/dL (ref 65–99)
Potassium: 3.8 mmol/L (ref 3.5–5.1)
Sodium: 141 mmol/L (ref 135–145)

## 2016-01-11 LAB — PROTIME-INR
INR: 2.73
PROTHROMBIN TIME: 29.5 s — AB (ref 11.4–15.2)

## 2016-01-11 MED ORDER — FERROUS SULFATE 325 (65 FE) MG PO TABS: 325.0000 mg | ORAL_TABLET | Freq: Every day | ORAL | 3 refills | Status: DC

## 2016-01-11 MED ORDER — WARFARIN SODIUM 5 MG PO TABS: 5.0000 mg | ORAL_TABLET | Freq: Every day | ORAL | 2 refills | Status: DC

## 2016-01-11 MED ORDER — FUROSEMIDE 20 MG PO TABS
20.0000 mg | ORAL_TABLET | Freq: Every day | ORAL | 1 refills | Status: DC | PRN
Start: 2016-01-11 — End: 2016-03-27

## 2016-01-11 MED ORDER — FERROUS SULFATE 325 (65 FE) MG PO TABS
325.0000 mg | ORAL_TABLET | Freq: Every day | ORAL | Status: DC
  Administered 2016-01-11: 325 mg via ORAL
  Filled 2016-01-11: qty 1

## 2016-01-11 MED ORDER — COLCHICINE 0.6 MG PO TABS: 0.6000 mg | ORAL_TABLET | Freq: Every day | ORAL | 0 refills | Status: AC

## 2016-01-11 MED ORDER — POLYETHYLENE GLYCOL 3350 17 G PO PACK: 17.0000 g | PACK | Freq: Every day | ORAL | 4 refills | Status: AC

## 2016-01-11 MED ORDER — WARFARIN SODIUM 5 MG PO TABS
5.0000 mg | ORAL_TABLET | Freq: Once | ORAL | Status: AC
  Administered 2016-01-11: 5 mg via ORAL
  Filled 2016-01-11: qty 1

## 2016-01-11 MED ORDER — SENNA 8.6 MG PO TABS
1.0000 | ORAL_TABLET | Freq: Every day | ORAL | 3 refills | Status: DC
Start: 2016-01-11 — End: 2016-02-16

## 2016-01-11 MED ORDER — PANTOPRAZOLE SODIUM 40 MG PO TBEC: 40.0000 mg | DELAYED_RELEASE_TABLET | Freq: Every day | ORAL | 2 refills | Status: AC

## 2016-01-11 NOTE — Progress Notes (Signed)
Subjective: [name] continues to be in good spirits today. He reports his right foot still hurts to some degree but it continues to improve. He reports having a normal BM yesterday that was not dark/black. He is interested in leaving for SNF today.  Objective: Vital signs in last 24 hours: Vitals:   01/10/16 1631 01/10/16 2142 01/11/16 0434 01/11/16 0500  BP: (!) 138/53 104/69 (!) 163/55   Pulse: (!) 53 66 (!) 50   Resp: 19  16   Temp: 97.4 F (36.3 C) 97.8 F (36.6 C) 98 F (36.7 C)   TempSrc: Oral Oral Oral   SpO2:  99% 98%   Weight:    85.5 kg (188 lb 6.4 oz)  Height:        Intake/Output Summary (Last 24 hours) at 01/11/16 [id number] Last data filed at 01/11/16 0500  Gross per 24 hour  Intake              890 ml  Output              225 ml  Net              665 ml   Filed Weights   01/09/16 0300 01/10/16 0544 01/11/16 0500  Weight: 83.7 kg (184 lb 8.4 oz) 85.8 kg (189 lb 2.5 oz) 85.5 kg (188 lb 6.4 oz)   Physical Exam General appearance: Elderly gentleman resting comfortably in bed, in no distress, wife at bedside  HENT: Normocephalic, atraumatic Cardiovascular: Irregular rhythm, normal rate, soft ejection murmur, no rubs/gallops Respiratory: CTAB, normal work of breathing Abdomen: BS+, soft, non-tender to palpation, non-distended Extremities: Normal bulk and range of motion, No pitting edema. Right medial big toe tender to touch and mildly erythematous. No significant warmth or effusion appreciated.  Skin: Warm, dry, intact, scattered solar lentigo  Labs / Imaging / Procedures: CBC Latest Ref Rng & Units 01/11/2016 01/10/2016 01/09/2016  WBC 4.0 - 10.5 K/uL 3.9(L) 4.5 4.6  Hemoglobin 13.0 - 17.0 g/dL 8.4(L) 8.0(L) 8.3(L)  Hematocrit 39.0 - 52.0 % 24.2(L) 23.7(L) 24.5(L)  Platelets 150 - 400 K/uL 124(L) 122(L) 103(L)   BMP Latest Ref Rng & Units 01/10/2016 01/09/2016 01/07/2016  Glucose 65 - 99 mg/dL 97 95 95  BUN 6 - 20 mg/dL 15 11 21(H)  Creatinine 0.61 - 1.24  mg/dL 0.96 0.95 0.99  Sodium 135 - 145 mmol/L 139 139 142  Potassium 3.5 - 5.1 mmol/L 3.5 3.6 3.9  Chloride 101 - 111 mmol/L 107 107 112(H)  CO2 22 - 32 mmol/L 26 26 25   Calcium 8.9 - 10.3 mg/dL 8.5(L) 8.6(L) 8.7(L)   No results found.  Assessment/Plan: [name] is a 82 y.o. gentleman with PMH AAA s/p repair 2010/2012, IDA, AS s/p bioprosthetic AVR on Coumadin, chronic Afib, HFpEF (LVEF 55-60% in 08/2014), CAD s/p CABGx5 in 2000, emphysema, gout, carotid stenosis, HTN, HLD, CVA x3, and DJD admitted for syncope and melena.  Active Problems:  Right foot pain, likely gout/podagra - Last gout attack was 5 yr ago, previously in knees. Was taking colchicine in the past. Not on allopurinol. Started Colchicine on 11/26, now with symptom improvement  - 0.6 mg colchicine daily for at least one week - will avoid NSAIDs/indomethicin given GIB - obtain uric acid level in the intercritical period  GI bleed with acute blood loss anemia: Over last two days, patient's hemoglobin has been stable at 8.0-8.5. He had 2 bowel movements on 11/25 which he described as dark. Had another  small one that evening which was not dark.  His INR is therapeutic today at 2.73 today.  -awaiting SNF placement -coumadin per pharm -protonix -avoid NSAIDs and holding aspirin  HFpEF, (LVEF 55-60% in 08/2014 with severe LAE and moderate RAE), weight stable at 184 lbs   - Coreg 6.25 mg daily - Losartan 25 mg daily - Lasix 20 QD  - Holding home Aspirin 81mg   Atrial fibrillation, CHADSVASc score 7, INR goal 2-3, ongoing but rate-controlled. INR is therapeutic today. - Coumadin per pharm - Coreg  HTN, mildly hypertensive to 167/59, pain likely contributing factor  -  Home Lasix 20 QD - Coreg and Losartan  Deconditioning - Continue physical therapy treatment sessions as tolerated  FEN/GI:  HH diet , replete electrolytes as needed  DVT ppx: Coumadin  Dispo: Anticipated discharge today. Case management  working on SNF placement.   LOS: 7 days   [name], MD 01/11/2016, 6:55 AM Pager: [phone]

## 2016-01-11 NOTE — Care Management Note (Signed)
Case Management Note  Patient Details  Name: [name] MRN: [id number] Date of Birth: [birth year]  Subjective/Objective:                 Patient admitted with syncope and GIB.    Action/Plan:  DC to SNF today as facilitated by CSW.   Expected Discharge Date:                  Expected Discharge Plan:  Skilled Nursing Facility  In-House Referral:  Clinical Social Work  Discharge planning Services  CM Consult  Post Acute Care Choice:    Choice offered to:     DME Arranged:    DME Agency:     HH Arranged:    [place] Agency:     Status of Service:  Completed, signed off  If discussed at [employer], dates discussed:    Additional Comments:  Carles Collet, RN 01/11/2016, 12:08 PM

## 2016-01-11 NOTE — Progress Notes (Signed)
ANTICOAGULATION CONSULT NOTE  Pharmacy Consult for warfarin Indication: atrial fibrillation  No Known Allergies  Patient Measurements: Height: 5\' 10"  (177.8 cm) Weight: 188 lb 6.4 oz (85.5 kg) IBW/kg (Calculated) : 73  Vital Signs: Temp: 97.4 F (36.3 C) (11/28 1337) Temp Source: Oral (11/28 1337) BP: 147/65 (11/28 1337) Pulse Rate: 66 (11/28 1337)  Labs:  Recent Labs  01/09/16 0534 01/10/16 0657 01/11/16 0623  HGB 8.3* 8.0* 8.4*  HCT 24.5* 23.7* 24.2*  PLT 103* 122* 124*  LABPROT 25.8* 29.0* 29.5*  INR 2.31 2.67 2.73  CREATININE 0.95 0.96 1.12    Estimated Creatinine Clearance: 48 mL/min (by C-G formula based on SCr of 1.12 mg/dL).  Assessment: 82 y/o male on warfarin PTA for Afib, hx stroke, and tissue AVR. Admitted with GIB, s/p EGD. GI ok with resuming warfarin on 11/22 and pharmacy consulted to manage. CHADS2VASC is 54 (age, stroke, CHF, HTN, PVD).   INR is therapeutic at 2.73. s/p reversal with vitamin K (11/21), PRBC, and FFP. Patient had 2 dark stools 11/25 am but no active bleeding was noted. Had a small BM 11/25 pm that was not dark.  Hgb up to 8's but trending down. Platelets noted to be low but improving (has hx platelets <150K dating back to 2012). INR elevated on admission (3.09), possible need for lower maintenance dose. Aspirin has been on hold.  Patient's PO intake improved which should help maintain INR.   PTA regimen: 7.5 mg daily except 11.25 mg Mon  Goal of Therapy:  INR 2-3 Monitor platelets by anticoagulation protocol: Yes   Plan:   Going home today, with plan for Coumadin 5 mg daily.  Wife unable to pick up new Rx until tomorrow, so today's dose will be given prior to discharge.  (They have 7.5 mg Coumadin tablets at home).    [name] Pager: [phone] 01/11/2016 4:57 PM

## 2016-01-11 NOTE — Care Management Note (Signed)
Case Management Note  Patient Details  Name: [name] MRN: [id number] Date of Birth: 1926/09/04  Subjective/Objective:                  syncope and GIB  Action/Plan: CM spoke with the patient and his family (wife and daughter) at the bedside. Provided a list of [place] providers. Patient's wife selected [facility] for [facility] services. Home Health orders and face sheet faxed to [facility]. Patient's family states he will need a wheelchair and hospital bed for home use since he is not going to a SNF. DME orders faxed to [facility]. Patient's daughter informed the DME orders and HH orders have been sent to [facility]. Provided the telephone number to patient's daughter.   Expected Discharge Date:    01/11/16              Expected Discharge Plan:  [place]  In-House Referral:     Discharge planning Services  CM Consult  Post Acute Care Choice:  Home Health Choice offered to:  Spouse  DME Arranged:  Hospital bed, Wheelchair manual DME Agency:  [place] Arranged:  PT, OT, RN, Nurse's Aide [place] Agency:  [place]  Status of Service:  Completed, signed off  If discussed at [place] Meetings, dates discussed:    Additional Comments:  Apolonio Schneiders, RN 01/11/2016, 5:52 PM

## 2016-01-11 NOTE — Progress Notes (Signed)
Subjective: [name] is well appearing today. He and is family are ready for discharge to SNF. Wife states she has removed all NSAIDs from house but not beer. Family understands relation of upper GI bleed to ETOH, NSAIDs, and warfarin.   Objective: Vital signs in last 24 hours: Vitals:   01/10/16 1631 01/10/16 2142 01/11/16 0434 01/11/16 0500  BP: (!) 138/53 104/69 (!) 163/55   Pulse: (!) 53 66 (!) 50   Resp: 19  16   Temp: 97.4 F (36.3 C) 97.8 F (36.6 C) 98 F (36.7 C)   TempSrc: Oral Oral Oral   SpO2:  99% 98%   Weight:    85.5 kg (188 lb 6.4 oz)  Height:       Weight change: -0.342 kg (-12.1 oz)  Intake/Output Summary (Last 24 hours) at 01/11/16 1038 Last data filed at 01/11/16 0900  Gross per 24 hour  Intake              830 ml  Output              225 ml  Net              605 ml   General appearance: alert, oriented x3,no distress and pale HEENT: normocephalic and atruamatic Pupils are equally round and reactive to light. Moist mucus membranes. Anicteric. No oral lesions present  Lymphatics: No lymphadenopathy. Cv: Sternal scar present. Irregular rate and rhythm. Nl s1 s2. Early peaking III/VI Systolic murmur radiating to both carotids loudest in upper sternal borders.  Vascular: Distal pulses 2+. Bruit present in carotids Resp: No appearent scars on back. NWB. CTAB.  Abdomen: NBS. non distended and non tender. No rebound. No organomegaly or masses.  Rectal: Deferred 2/2 to ED eval - Black stool on rectal exam Neurologic: Oriented x3. Normal visual acuity. EOM intact.Facial movements symmetric. Facial sensation is symmetric. No dysarthria. Uvula and tongue midline. No dysphagia or dysphonia.  Psych: Euthymic affect and appropriate mood Ext: Warm extremities. 1+ LE edema to ankle. No rashes present. R foot tender on anterior mid foot. Non erythematous. Non tender at MTJ.   Lab Results: Hb 8.4 INR 2.73 Platelets 124 Medications: I have reviewed the patient's  current medications. Scheduled Meds: . carvedilol  3.125 mg Oral BID WC  . colchicine  0.6 mg Oral Daily  . cyanocobalamin  1,000 mcg Intramuscular Q30 days  . ferrous sulfate  325 mg Oral Q breakfast  . furosemide  20 mg Oral Daily  . latanoprost  1 drop Both Eyes QHS  . levothyroxine  25 mcg Oral QAC breakfast  . losartan  25 mg Oral Daily  . pantoprazole  40 mg Oral Daily  . polyethylene glycol  17 g Oral Daily  . senna  1 tablet Oral Daily  . sodium chloride flush  3 mL Intravenous Q12H  . Warfarin - Pharmacist Dosing Inpatient   Does not apply q1800   Continuous Infusions: . lactated ringers 10 mL/hr at 01/10/16 0627   PRN Meds:.acetaminophen **OR** acetaminophen Assessment/Plan: Principal Problem:   Gastrointestinal hemorrhage Active Problems:   Chronic atrial fibrillation (HCC)   Hypothyroidism   S/P AVR (aortic valve replacement)   CKD stage G2/A2, GFR 60-89 and albumin creatinine ratio 30-299 mg/g   Hx of CABG   History of completed stroke   Status post abdominal aortic aneurysm (AAA) repair   Gout attack   Constipation   Thrombocytopenia (HCC)  kaemon, sandefer is a 82 yo M with a significant  PMH of atrial fibrillation currently on warfarin, h/o stroke, Aortic Stenosis s/p tissue AVR [id number], Diastolic HFpEF, CAD s/p CAPG 5 vessel 2000, AAA s/p endograft who presented to the ED with syncope and melena with a significant fall in hgb from baseline consistent with a GI bleed.  Upper GI Bleed with acute anemia The patient's three day history of black stools, weakness, orthostatic syncope, everyday use of Aleve, 3 drinks of ETOH per day, and currently warfarin most likely points to a upper GI bleed from gastritis. The decrease in H/H from 12.7/35.6>8.9/25.9, positive fecal occult blood, elevated BUN, and pallor on exam is suggestive of this as well. The patient was initially started on IV pantoprazole and IV fluids. Upper EGD was withheld initially due to an elevated INR.  After 2xRPCs and 2xFFP, H/H and INR improved and Upper EGD showed:Z-line regular, 38 cm from the incisors. 5 cm hiatal hernia. Non-bleeding erosive gastropathy. Biopsy was negative for H. Pylori. The patient was then restarted on his warfarin with a therapeutic goal INR of 2-3   - INR of 2.73  - Hgb of 8.4 today  - Will start ferrous sulfate 325 mg - Pantoprazole 40mg  qd oral - ContinueWarfarin - StopASA - Stop NSAIDS - Off tele  Syncope Preceded by weakness, and most likely 2/2 to bleed. Although the exam showed MMM, his recent increase to lasix 40mg  qd may have contributed his orthostatic syncope as suggested by hyaline casts in urine sed and elevated BUN/Cr ratio>20. The patient had no episodes of syncope while in the hospital.  - The patient was monitored on telemetry which recorded atrial fibrillation and some bouts of bradycardia in the 40s overnight, but remained asymptomatic. - After fluid resuscitation and pRBCs, patient no longer complained of weakness.   Atrial fibrillationPatient remained in a fib presently but alsobradycardic. CHADSVASC score of 7.    - Warfarin   - Carvedilol [id number] mg  Diastolic HFpEF No signs of volume overload. Most recent echo 08/2014 could not evaluate diastolic function   - losartan 25mg  qd  - furosemide 20 mg qd  HTN Patient hadsoft pressures at arrival. Patient became hypertensive to 160s overnight and hypotensive after the procedure.  - Carvedilol as above - furosemide as above - losartan as above  Hypothyroidism - TSH of 3.576 - continue levothyroxine 51mcg qd  Right Knee Arthritis - Hold aleve  - PRN acetaminophen 650mg  q6  - PT  Gout Patient complained of pain of the medial side of the right toe. The patient responded to colchicine the next day.    - Patient denies any improvement today  - Colchicine 0.6 mg qd for 7 days (day 3)  - Avoids NSAIDs - Uric acid levels at outpatient  FEN/GI  - Foley  placed yesterday 2/2 to incontinence - will remove - HH diet  - PEG 17g qd  - senna 8.6 mg qd - DVT prophylaxis: SCDs 2/2 to bleed  Access - pIV x1  Resuscitation - DNR  Dispo  - Per Sw  - Ready from medical aspect  This is a Careers information officer Note.  The care of the patient was discussed with Dr. Wynetta Emery and the assessment and plan formulated with their assistance.  Please see their attached note for official documentation of the daily encounter.   LOS: 7 days   Benn Moulder, Medical Student 01/11/2016, 10:38 AM

## 2016-01-11 NOTE — Progress Notes (Deleted)
[name] to be D/C'd Home per MD order.  Discussed with the patient and all questions fully answered.  VSS, Skin clean, dry and intact without evidence of skin break down, no evidence of skin tears noted. IV catheter discontinued intact. Site without signs and symptoms of complications. Dressing and pressure applied.  An After Visit Summary was printed and given to the patient. Patient received prescription.  D/c education completed with patient/family including follow up instructions, medication list, d/c activities limitations if indicated, with other d/c instructions as indicated by MD - patient able to verbalize understanding, all questions fully answered.   Patient instructed to return to ED, call 911, or call MD for any changes in condition.   Patient escorted via [place], and D/C home via private auto.  [name] 01/11/2016 6:28 PM

## 2016-01-11 NOTE — Progress Notes (Signed)
CSW informed pt and pt wife that [facility] has denied SNF stay for patient and they have option to appeal if desired- wife and husband have decided not to appeal decision and will go home with home health.  CSW updated MD and requested home health orders  CSW informed after hours RNCM who will set up home health for patient  Pt dtr still in from out of town and will assist at home temporarily- wife requests PTAR so they can have assistance getting patient safely in house  Patient will discharge to home Anticipated discharge date:11/28 Family notified: wife at bedside Transportation by PTAR- called at [place] signing off.  Jorge Ny, LCSW Clinical Social Worker [phone]

## 2016-01-11 NOTE — Clinical Social Work Placement (Signed)
   CLINICAL SOCIAL WORK PLACEMENT  NOTE  Date:  01/11/2016  Patient Details  Name: [name] MRN: [id number] Date of Birth: 1926/05/12  Clinical Social Work is seeking post-discharge placement for this patient at the [place] level of care (*CSW will initial, date and re-position this form in  chart as items are completed):  Yes   Patient/family provided with [place] Work Department's list of facilities offering this level of care within the geographic area requested by the patient (or if unable, by the patient's family).  Yes   Patient/family informed of their freedom to choose among providers that offer the needed level of care, that participate in Medicare, Medicaid or managed care program needed by the patient, have an available bed and are willing to accept the patient.  Yes   Patient/family informed of [facility]'s ownership interest in [facility] and [facility], as well as of the fact that they are under no obligation to receive care at these facilities.  PASRR submitted to EDS on 01/04/16     PASRR number received on 01/04/16     Existing PASRR number confirmed on       FL2 transmitted to all facilities in geographic area requested by pt/family on 01/04/16     FL2 transmitted to all facilities within larger geographic area on       Patient informed that his/her managed care company has contracts with or will negotiate with certain facilities, including the following:        Yes   Patient/family informed of bed offers received.  Patient chooses bed at [facility]     Physician recommends and patient chooses bed at      Patient to be transferred to [facility] on 01/11/16.  Patient to be transferred to facility by ptar     Patient family notified on 01/11/16 of transfer.  Name of family member notified:  hilda     PHYSICIAN Please sign FL2, Please sign DNR     Additional Comment:     _______________________________________________ Jorge Ny, LCSW 01/11/2016, 12:30 PM

## 2016-01-12 NOTE — Progress Notes (Signed)
Confirmed with [facility] that orders were received for [facility] RN HHA PT OT, hospital bed, and wheelchair. Start of care will be 11/30. Updated patient's daughter Sonia Baller [phone]

## 2016-01-17 ENCOUNTER — Telehealth: Payer: Self-pay | Admitting: Cardiology

## 2016-01-17 NOTE — Telephone Encounter (Signed)
New Message  Pt Wife call requesting to speak with RN about getting and order for [facility] to come to pt home and check pt blood for coumadin. Please call back to discuss

## 2016-01-19 ENCOUNTER — Ambulatory Visit (INDEPENDENT_AMBULATORY_CARE_PROVIDER_SITE_OTHER): Payer: Medicare HMO | Admitting: Pharmacist Clinician (PhC)/ Clinical Pharmacy Specialist

## 2016-01-19 DIAGNOSIS — Z7901 Long term (current) use of anticoagulants: Secondary | ICD-10-CM

## 2016-01-19 DIAGNOSIS — I482 Chronic atrial fibrillation, unspecified: Secondary | ICD-10-CM

## 2016-01-19 LAB — POCT INR: INR: 1.7

## 2016-01-19 NOTE — Telephone Encounter (Signed)
Spoke with [facility], will have INR drawn Wednesday

## 2016-01-24 LAB — PROTIME-INR: INR: 1.7 — AB (ref <=1.1)

## 2016-01-25 ENCOUNTER — Ambulatory Visit (INDEPENDENT_AMBULATORY_CARE_PROVIDER_SITE_OTHER): Payer: Medicare HMO | Admitting: Internal Medicine

## 2016-01-25 ENCOUNTER — Telehealth: Payer: Self-pay | Admitting: Pharmacist

## 2016-01-25 DIAGNOSIS — I482 Chronic atrial fibrillation, unspecified: Secondary | ICD-10-CM

## 2016-01-25 DIAGNOSIS — Z7901 Long term (current) use of anticoagulants: Secondary | ICD-10-CM

## 2016-01-25 NOTE — Telephone Encounter (Signed)
See anticoagulation note 

## 2016-01-31 ENCOUNTER — Ambulatory Visit (INDEPENDENT_AMBULATORY_CARE_PROVIDER_SITE_OTHER): Payer: Medicare HMO | Admitting: Pharmacist Clinician (PhC)/ Clinical Pharmacy Specialist

## 2016-01-31 DIAGNOSIS — Z7901 Long term (current) use of anticoagulants: Secondary | ICD-10-CM

## 2016-01-31 DIAGNOSIS — I482 Chronic atrial fibrillation, unspecified: Secondary | ICD-10-CM

## 2016-01-31 LAB — POCT INR: INR: 1.9

## 2016-02-09 ENCOUNTER — Ambulatory Visit (INDEPENDENT_AMBULATORY_CARE_PROVIDER_SITE_OTHER): Payer: Medicare HMO | Admitting: Pharmacist

## 2016-02-09 DIAGNOSIS — Z7901 Long term (current) use of anticoagulants: Secondary | ICD-10-CM

## 2016-02-09 DIAGNOSIS — I482 Chronic atrial fibrillation, unspecified: Secondary | ICD-10-CM

## 2016-02-09 LAB — POCT INR: INR: 2.4

## 2016-02-12 NOTE — Progress Notes (Signed)
02/16/2016 [name]   05-11-1926  [id number]  Primary Physician Tamsen Roers, MD Primary Cardiologist: Dr. Martinique   Reason for Visit/CC:  F/u for  Atrial Fibrillation  HPI:  The patient is a 82 year old male seen for follow up  atrial fibrillation. He has a history of coronary disease and is status post CABG in 2000. Cardiac catheterization September 2012 showed patent grafts. He developed severe aortic stenosis and underwent aortic valve replacement with a tissue prosthesis. Prior to surgery he had congestive heart failure with ejection fraction 25%. Postoperatively his ejection fraction improved to 45%. He did have atrial fibrillation during that time and was placed on amiodarone and Coumadin. He was cardioverted in October of 2012. His amiodarone was then discontinued. He also has an abdominal aortic aneurysm and is status post stent grafting in 2009. He has a history of type II endoleak and had a coil procedure on one occasion. 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 This is being managed conservatively. He is s/p left CEA.  In July 2016 he was admitted with acute diastolic CHF and atrial fibrillation. He was severely hypertensive. He was treated with rate control and anticoagulation. He underwent DCCV on 11/11/14.   He was admitted from 11/20-11/28/17 with orthostatic syncope and a GI bleed. Heme positive stool. Hgb dropped to 6.7. He was transfused with improvement in Hgb to 8.2 which remained stable. Coumadin was reversed. Upper EGD showed multiple small gastric erosions and gastritis. H.pylori negative. Risk factors of ASA and daily NSAID use noted as well as Etoh consumption. ASA and NSAIDs stopped. Coumadin resumed. Discharged to extended care facility for Rehab. He also had flair of gout. Ecg showed AFib with LBBB. Rate controlled. CXR without acute findings. Last INR 2.4.   On follow up today he is doing OK. Denies any chest pain or SOB. Gout has  improved. No further bleeding. Has abstained from Etoh and NSAIDs. He has lost 6 lbs.  Current Outpatient Prescriptions  Medication Sig Dispense Refill  . carvedilol (COREG) 6.25 MG tablet Take 3.125 mg by mouth 2 (two) times daily with a meal.    . clotrimazole ([employer] ITCH RELIEF) 1 % cream Apply 1 application topically as needed.    . colchicine 0.6 MG tablet Take 1 tablet (0.6 mg total) by mouth daily. 30 tablet 0  . cyanocobalamin (,VITAMIN B-12,) 1000 MCG/ML injection Inject 1,000 mcg into the muscle every 30 (thirty) days.    . dorzolamide-timolol (COSOPT) 22.3-6.8 MG/ML ophthalmic solution 1 drop 2 (two) times daily.    [name] doxylamine, Sleep, (UNISOM) 25 MG tablet Take 25 mg by mouth at bedtime.    . ferrous sulfate 325 (65 FE) MG tablet Take 1 tablet (325 mg total) by mouth daily with breakfast. 30 tablet 3  . furosemide (LASIX) 20 MG tablet Take 1 tablet (20 mg total) by mouth daily as needed for edema (for edema and shortness of breath). 30 tablet 1  . latanoprost (XALATAN) 0.005 % ophthalmic solution Place 1 drop into both eyes at bedtime.    [name] levothyroxine (SYNTHROID, LEVOTHROID) 25 MCG tablet TAKE ONE TABLET BY MOUTH BEFORE BREAKFAST. MUST HAVE OFFICE VISIT BEFORE FURTHER REFILLS. 30 tablet 2  . losartan (COZAAR) 25 MG tablet Take 1 tablet (25 mg total) by mouth daily. 90 tablet 3  . Multiple Vitamin (MULTIVITAMIN) tablet Take 1 tablet by mouth 2 (two) times daily.     . Oxymetazoline HCl (NASAL SPRAY) 0.05 % SOLN Place into the nose  as needed.    . pantoprazole (PROTONIX) 40 MG tablet Take 1 tablet (40 mg total) by mouth daily. 60 tablet 2  . polyethylene glycol (MIRALAX / GLYCOLAX) packet Take 17 g by mouth daily. 14 each 4  . warfarin (COUMADIN) 5 MG tablet Take 1 tablet (5 mg total) by mouth daily. 60 tablet 2  . allopurinol (ZYLOPRIM) 100 MG tablet Take 1 tablet (100 mg total) by mouth daily. 90 tablet 3   No current facility-administered medications for this visit.     No  Known Allergies  Social History   Social History  . Marital status: Married    Spouse name: N/A  . Number of children: N/A  . Years of education: N/A   Occupational History  . Not on file.   Social History Main Topics  . Smoking status: Former Research scientist (life sciences)  . Smokeless tobacco: Never Used     Comment: pt states he smokes 3-4 cigars every day  . Alcohol use 1.2 oz/week    2 Cans of beer per week  . Drug use: No  . Sexual activity: Not on file   Other Topics Concern  . Not on file   Social History Narrative  . No narrative on file     Review of Systems: As noted in HPI. All other systems reviewed and are otherwise negative except as noted above.    Blood pressure 134/72, pulse (!) 54, height 5\' 11"  (1.803 m), weight 170 lb 8 oz (77.3 kg).  GENERAL:  Elderly WM in NAD.  HEENT:  PERRL, EOMI, sclera are clear. Oropharynx is clear. Missing several upper teeth. NECK:  No jugular venous distention, carotid upstroke brisk and symmetric, no bruits, no thyromegaly or adenopathy LUNGS:  Clear to auscultation bilaterally CHEST:  Unremarkable HEART:  RRR,  PMI not displaced or sustained,S1 and S2 within normal limits, no S3, no S4: no clicks, no rubs, no murmurs ABD:  Soft, nontender. BS +, no masses or bruits. No hepatomegaly, no splenomegaly EXT:  2 + pulses throughout, no edema, no cyanosis no clubbing. No active gout flair. SKIN:  Warm and dry.  No rashes NEURO:  Alert and oriented x 3. Cranial nerves II through XII intact. PSYCH:  Cognitively intact    Lab Results  Component Value Date   WBC 3.9 (L) 01/11/2016   HGB 8.4 (L) 01/11/2016   HCT 24.2 (L) 01/11/2016   PLT 124 (L) 01/11/2016   GLUCOSE 96 01/11/2016   CHOL 147 07/31/2011   TRIG 64.0 07/31/2011   HDL 60.50 07/31/2011   LDLCALC 74 07/31/2011   ALT 13 (L) 01/03/2016   AST 19 01/03/2016   NA 141 01/11/2016   K 3.8 01/11/2016   CL 109 01/11/2016   CREATININE 1.12 01/11/2016   BUN 15 01/11/2016   CO2 26  01/11/2016   TSH 3.576 01/03/2016   INR 2.4 02/16/2016   Echo: 09/09/14:Study Conclusions  - HPI and indications: Afib with pauses noted during the study. - Left ventricle: The cavity size was normal. Wall thickness was increased in a pattern of mild LVH. Systolic function was normal. The estimated ejection fraction was in the range of 55% to 60%. The study is not technically sufficient to allow evaluation of LV diastolic function. - Aortic valve: Bioprosthetic AVR. No obstruction. Trivial AI. - Mitral valve: Calcified annulus. Mildly thickened leaflets . There was mild regurgitation. - Left atrium: Severely dilated at 62 ml/m2. - Right ventricle: The cavity size was mildly dilated. Systolic function is  reduced. - Right atrium: Moderately dilated at 26 cm2. - Tricuspid valve: There was mild regurgitation. - Pulmonary arteries: PA peak pressure: 29 mm Hg (S). - Inferior vena cava: The vessel was normal in size. The respirophasic diameter changes were in the normal range (>= 50%), consistent with normal central venous pressure.  Impressions:  - Compared to a prior study in 2012, the LVEF is improved to 55-60%, however, there are pauses noted and the underlying rhythm is a-fib. There is no evidence for obstruction of the bioprosthetic AVR. There is severe LAE and moderate RAE.  ASSESSMENT AND PLAN:   1. Chronic diastolic heart failure: Appears to be euvolemic on exam. Continue diuretic therapy with Lasix as prescribed along with daily weights and low-sodium diet.  2. Atrial fibrillation with a slow ventricular response:  s/p successful DCCV in September. Rhythm is regular today. Continue beta blocker and coumadin. INR adjustment per pharmacy.  3. AS s/p tissue AVR in 9/12. Echo showed good valve function.  4. CAD s/p CABG in 2000. Cath in 2012 showed patent grafts. No angina.  5. AAA s/p endograft with persistent endoleak. Manage conservatively.  6. S/p  left CEA. Carotid dopplers in October showed no significant stenosis.  7. Spinal stenosis. S/p surgery.   8. GI bleed secondary to gastritis and gastric erosions. Felt to be related to ASA and NSAID use with Etoh use as well. Continue coumadin for now.  9. Gout. Acute flair resolved. Will start allopurinol 100 mg daily to help prevent recurrence.     Riot Waterworth Martinique MD,FACC  02/16/2016 3:50 PM

## 2016-02-16 ENCOUNTER — Ambulatory Visit (INDEPENDENT_AMBULATORY_CARE_PROVIDER_SITE_OTHER): Payer: Medicare HMO | Admitting: Cardiology

## 2016-02-16 ENCOUNTER — Encounter: Payer: Self-pay | Admitting: Cardiology

## 2016-02-16 ENCOUNTER — Ambulatory Visit (INDEPENDENT_AMBULATORY_CARE_PROVIDER_SITE_OTHER): Payer: Medicare HMO | Admitting: Pharmacist Clinician (PhC)/ Clinical Pharmacy Specialist

## 2016-02-16 ENCOUNTER — Telehealth: Payer: Self-pay | Admitting: Cardiology

## 2016-02-16 VITALS — BP 134/72 | HR 54 | Ht 71.0 in | Wt 170.5 lb

## 2016-02-16 DIAGNOSIS — I10 Essential (primary) hypertension: Secondary | ICD-10-CM

## 2016-02-16 DIAGNOSIS — I5022 Chronic systolic (congestive) heart failure: Secondary | ICD-10-CM | POA: Diagnosis not present

## 2016-02-16 DIAGNOSIS — I4891 Unspecified atrial fibrillation: Secondary | ICD-10-CM

## 2016-02-16 DIAGNOSIS — Z952 Presence of prosthetic heart valve: Secondary | ICD-10-CM

## 2016-02-16 DIAGNOSIS — I482 Chronic atrial fibrillation, unspecified: Secondary | ICD-10-CM

## 2016-02-16 DIAGNOSIS — Z7901 Long term (current) use of anticoagulants: Secondary | ICD-10-CM

## 2016-02-16 DIAGNOSIS — I25810 Atherosclerosis of coronary artery bypass graft(s) without angina pectoris: Secondary | ICD-10-CM

## 2016-02-16 LAB — POCT INR: INR: 2.4

## 2016-02-16 MED ORDER — ALLOPURINOL 100 MG PO TABS: 100.0000 mg | ORAL_TABLET | Freq: Every day | ORAL | 3 refills | Status: DC

## 2016-02-16 NOTE — Patient Instructions (Addendum)
Use colchicine 0.6 mg twice a day as needed for acute gout flair.  Take allopurinol 100 mg daily  Continue your other medication.  I will see you in 6 months

## 2016-02-16 NOTE — Telephone Encounter (Signed)
Satya from [facility] is calling on behalf of patient [name]) because he states that patient was recently prescribed allopurinol and is also taking warfarin. Willey Blade states that there is a drug interaction, the warfarin increases the effects of allopurinol and it may cause increased bleeding. He states that they will not fill this medication until they speak with someone to get clearance. Please call, thanks.

## 2016-02-16 NOTE — Telephone Encounter (Signed)
Pharmacy notified that Dr. Martinique and our clinical pharmacist are aware

## 2016-02-29 ENCOUNTER — Other Ambulatory Visit: Payer: Self-pay | Admitting: Cardiology

## 2016-02-29 NOTE — Telephone Encounter (Signed)
REFILL 

## 2016-03-15 ENCOUNTER — Ambulatory Visit (INDEPENDENT_AMBULATORY_CARE_PROVIDER_SITE_OTHER): Payer: Medicare HMO | Admitting: Pharmacist Clinician (PhC)/ Clinical Pharmacy Specialist

## 2016-03-15 DIAGNOSIS — I482 Chronic atrial fibrillation, unspecified: Secondary | ICD-10-CM

## 2016-03-15 DIAGNOSIS — I4891 Unspecified atrial fibrillation: Secondary | ICD-10-CM

## 2016-03-15 DIAGNOSIS — Z7901 Long term (current) use of anticoagulants: Secondary | ICD-10-CM | POA: Diagnosis not present

## 2016-03-15 LAB — POCT INR: INR: 2.2

## 2016-03-23 ENCOUNTER — Other Ambulatory Visit: Payer: Self-pay | Admitting: Cardiology

## 2016-03-27 ENCOUNTER — Telehealth: Payer: Self-pay | Admitting: Cardiology

## 2016-03-27 MED ORDER — FUROSEMIDE 20 MG PO TABS: 20.0000 mg | ORAL_TABLET | Freq: Every day | ORAL | 3 refills | Status: DC | PRN

## 2016-03-27 NOTE — Telephone Encounter (Signed)
Returned call to patient's wife she stated husband needs a 90 day refill for lasix.Advised I will send refill to pharmacy.

## 2016-03-27 NOTE — Telephone Encounter (Signed)
New message    Needs refill on  furosemide (LASIX) 20 MG tablet Take 1 tablet (20 mg total) by mouth daily as needed for edema (for edema and shortness of breath).   This was written by the attending doctor at hospital and they want Dr Martinique to refill it for them , pt only has one left

## 2016-04-26 ENCOUNTER — Ambulatory Visit (INDEPENDENT_AMBULATORY_CARE_PROVIDER_SITE_OTHER): Payer: Medicare HMO | Admitting: Pharmacist

## 2016-04-26 DIAGNOSIS — I482 Chronic atrial fibrillation, unspecified: Secondary | ICD-10-CM

## 2016-04-26 DIAGNOSIS — Z7901 Long term (current) use of anticoagulants: Secondary | ICD-10-CM | POA: Diagnosis not present

## 2016-04-26 DIAGNOSIS — I4891 Unspecified atrial fibrillation: Secondary | ICD-10-CM | POA: Diagnosis not present

## 2016-04-26 LAB — POCT INR: INR: 1.8

## 2016-05-09 ENCOUNTER — Telehealth: Payer: Self-pay | Admitting: Cardiology

## 2016-05-09 MED ORDER — FERROUS SULFATE 325 (65 FE) MG PO TABS: 325.0000 mg | ORAL_TABLET | Freq: Every day | ORAL | 6 refills | Status: DC

## 2016-05-09 NOTE — Telephone Encounter (Signed)
Rx sent to pharmacy   

## 2016-05-09 NOTE — Telephone Encounter (Signed)
New message   Pt wife is calling to ask if Dr. Martinique wants pt to continue the ferrous sulfate 325 mg. If pt needs to continue they need a new prescription.    *STAT* If patient is at the pharmacy, call can be transferred to refill team.   1. Which medications need to be refilled? (please list name of each medication and dose if known) ferrous sulfate 325mg    2. Which pharmacy/location (including street and city if local pharmacy) is medication to be sent to? [facility] on [address] in [place]  3. Do they need a 30 day or 90 day supply? 30 day

## 2016-05-09 NOTE — Telephone Encounter (Signed)
Called and notified wife of refill, she voiced thanks

## 2016-05-09 NOTE — Telephone Encounter (Signed)
Ok to fill iron sulfate 325 mg daily  Trejuan Matherne Martinique MD, [facility]

## 2016-06-12 ENCOUNTER — Telehealth: Payer: Self-pay | Admitting: Cardiology

## 2016-06-12 MED ORDER — WARFARIN SODIUM 5 MG PO TABS: ORAL_TABLET | ORAL | 1 refills | Status: DC

## 2016-06-12 NOTE — Telephone Encounter (Signed)
New message    *STAT* If patient is at the pharmacy, call can be transferred to refill team.   1. Which medications need to be refilled? (please list name of each medication and dose if known) warfarin (COUMADIN) 5 MG tablet 2. Which pharmacy/location (including street and city if local pharmacy) is medication to be sent to? [facility] [address]  3. Do they need a 30 day or 90 day supply? 30 day

## 2016-06-12 NOTE — Telephone Encounter (Signed)
Fwd'd to [facility] to review and authorize.

## 2016-06-15 ENCOUNTER — Ambulatory Visit (INDEPENDENT_AMBULATORY_CARE_PROVIDER_SITE_OTHER): Payer: Medicare HMO | Admitting: Pharmacist Clinician (PhC)/ Clinical Pharmacy Specialist

## 2016-06-15 DIAGNOSIS — I482 Chronic atrial fibrillation, unspecified: Secondary | ICD-10-CM

## 2016-06-15 DIAGNOSIS — I4891 Unspecified atrial fibrillation: Secondary | ICD-10-CM | POA: Diagnosis not present

## 2016-06-15 DIAGNOSIS — Z7901 Long term (current) use of anticoagulants: Secondary | ICD-10-CM

## 2016-06-15 LAB — POCT INR: INR: 4.9

## 2016-06-26 ENCOUNTER — Telehealth: Payer: Self-pay | Admitting: Cardiology

## 2016-06-26 ENCOUNTER — Ambulatory Visit: Payer: Self-pay | Admitting: Pharmacist Clinician (PhC)/ Clinical Pharmacy Specialist

## 2016-06-26 DIAGNOSIS — Z7901 Long term (current) use of anticoagulants: Secondary | ICD-10-CM

## 2016-06-26 DIAGNOSIS — I482 Chronic atrial fibrillation, unspecified: Secondary | ICD-10-CM

## 2016-06-26 NOTE — Telephone Encounter (Signed)
New message      Calling to let pharmacist know that pt will start having his pt/inr checked at PCP's office, Dr Tamsen Roers.

## 2016-06-26 NOTE — Telephone Encounter (Signed)
Noted, anticoagulation encounter closed

## 2016-07-06 ENCOUNTER — Other Ambulatory Visit: Payer: Self-pay | Admitting: Cardiology

## 2016-07-06 NOTE — Telephone Encounter (Signed)
Rx has been sent to the pharmacy electronically. ° °

## 2016-08-13 ENCOUNTER — Other Ambulatory Visit: Payer: Self-pay | Admitting: Cardiology

## 2016-08-17 ENCOUNTER — Other Ambulatory Visit: Payer: Self-pay | Admitting: Cardiology

## 2016-08-17 ENCOUNTER — Other Ambulatory Visit: Payer: Self-pay | Admitting: *Deleted

## 2016-08-18 ENCOUNTER — Telehealth: Payer: Self-pay | Admitting: Cardiology

## 2016-08-18 MED ORDER — WARFARIN SODIUM 5 MG PO TABS: ORAL_TABLET | ORAL | 1 refills | Status: AC

## 2016-08-18 NOTE — Telephone Encounter (Signed)
°*  STAT* If patient is at the pharmacy, call can be transferred to refill team.   1. Which medications need to be refilled? (please list name of each medication and dose if known) Warfarin, 5 mg   2. Which pharmacy/location (including street and city if local pharmacy) is medication to be sent to? [place] ([place]), [place] - [address]  3. Do they need a 30 day or 90 day supply?[place]

## 2016-08-18 NOTE — Telephone Encounter (Signed)
Talked to Ms Laymon. Dr Rex Kras following up INR and warfarin doses. PCP office is close today and patient is out of mediation.  Will refill warfarin today but patient will request future refills from Dr Rex Kras.

## 2016-09-01 NOTE — Progress Notes (Signed)
09/07/2016 [name]   August 12, 1926  [id number]  Primary Physician Tamsen Roers, MD Primary Cardiologist: Dr. Martinique   Reason for Visit/CC:  F/u for  Atrial Fibrillation and CAD  HPI:  The patient is a 83 year old male seen for follow up  atrial fibrillation. He has a history of coronary disease and is status post CABG in 2000. Cardiac catheterization September 2012 showed patent grafts. He developed severe aortic stenosis and underwent aortic valve replacement with a tissue prosthesis. Prior to surgery he had congestive heart failure with ejection fraction 25%. Postoperatively his ejection fraction improved to 45%. He did have atrial fibrillation during that time and was placed on amiodarone and Coumadin. He was cardioverted in October of 2012. His amiodarone was then discontinued. He also has an abdominal aortic aneurysm and is status post stent grafting in 2009. He has a history of type II endoleak and had a coil procedure on one occasion. 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 This is being managed conservatively. He is s/p left CEA.  In July 2016 he was admitted with acute diastolic CHF and atrial fibrillation. He was severely hypertensive. He was treated with rate control and anticoagulation. He underwent DCCV on 11/11/14.  He was admitted from 11/20-11/28/17 with orthostatic syncope and a GI bleed. Heme positive stool. Hgb dropped to 6.7. He was transfused with improvement in Hgb to 8.2 which remained stable. Coumadin was reversed. Upper EGD showed multiple small gastric erosions and gastritis. H.pylori negative. Risk factors of ASA and daily NSAID use noted as well as Etoh consumption. ASA and NSAIDs stopped. Coumadin resumed.  He also had flair of gout. Ecg showed AFib with LBBB. Rate controlled. CXR without acute findings.   On follow up today he is seen with his wife. Gout has resolved. No further bleeding. Has abstained from Etoh and NSAIDs. He has  lost 4 lbs. Wife notes his activity level is very poor. He struggles to walk from one side of the house to the other. He still does chair exercises daily- using a pedaling machine and lifting dumbbells but with walking he gets fatigued and SOB. No increased edema. Wife reports that once a week he feels so bad that he wished it was all over. Long longer wants to go outside. Last INR 1.9.   Current Outpatient Prescriptions  Medication Sig Dispense Refill  . allopurinol (ZYLOPRIM) 100 MG tablet Take 1 tablet (100 mg total) by mouth daily. 90 tablet 3  . carvedilol (COREG) 6.25 MG tablet TAKE ONE TABLET BY MOUTH TWICE DAILY WITH MEALS 180 tablet 1  . clotrimazole ([employer] ITCH RELIEF) 1 % cream Apply 1 application topically as needed.    . colchicine 0.6 MG tablet Take 1 tablet (0.6 mg total) by mouth daily. 30 tablet 0  . cyanocobalamin (,VITAMIN B-12,) 1000 MCG/ML injection Inject 1,000 mcg into the muscle every 30 (thirty) days.    . dorzolamide-timolol (COSOPT) 22.3-6.8 MG/ML ophthalmic solution 1 drop 2 (two) times daily.    [name] doxylamine, Sleep, (UNISOM) 25 MG tablet Take 25 mg by mouth at bedtime.    . ferrous sulfate 325 (65 FE) MG tablet Take 1 tablet (325 mg total) by mouth daily with breakfast. 30 tablet 6  . furosemide (LASIX) 20 MG tablet Take 1 tablet (20 mg total) by mouth daily as needed for edema (for edema and shortness of breath). 90 tablet 3  . latanoprost (XALATAN) 0.005 % ophthalmic solution Place 1 drop into both eyes at  bedtime.    [name] levothyroxine (SYNTHROID, LEVOTHROID) 25 MCG tablet TAKE ONE TABLET BY MOUTH ONCE DAILY BEFORE BREAKFAST 30 tablet 6  . losartan (COZAAR) 25 MG tablet TAKE ONE TABLET BY MOUTH ONCE DAILY 90 tablet 3  . Multiple Vitamin (MULTIVITAMIN) tablet Take 1 tablet by mouth 2 (two) times daily.     . Naftifine HCl 2 % CREA APPLY TO THE AFFECTED AREA ON THE SKIN TWICE DAILY  2  . Oxymetazoline HCl (NASAL SPRAY) 0.05 % SOLN Place into the nose as needed.    .  pantoprazole (PROTONIX) 40 MG tablet Take 1 tablet (40 mg total) by mouth daily. 60 tablet 2  . polyethylene glycol (MIRALAX / GLYCOLAX) packet Take 17 g by mouth daily. 14 each 4  . tacrolimus (PROTOPIC) 0.1 % ointment     . warfarin (COUMADIN) 5 MG tablet Take 1 and 1/2 tablet (7.5mg ) every Sunday and Thursday and 1 tablet (5mg ) all other days of the week. 32 tablet 1   No current facility-administered medications for this visit.     No Known Allergies  Social History   Social History  . Marital status: Married    Spouse name: N/A  . Number of children: N/A  . Years of education: N/A   Occupational History  . Not on file.   Social History Main Topics  . Smoking status: Former Research scientist (life sciences)  . Smokeless tobacco: Never Used     Comment: pt states he smokes 3-4 cigars every day  . Alcohol use 1.2 oz/week    2 Cans of beer per week  . Drug use: No  . Sexual activity: Not on file   Other Topics Concern  . Not on file   Social History Narrative  . No narrative on file     Review of Systems: As noted in HPI. All other systems reviewed and are otherwise negative except as noted above.    Blood pressure 130/68, pulse 70, height 5\' 11"  (1.803 m), weight 166 lb 3.2 oz (75.4 kg).  GENERAL:  Elderly WM who appears chronically ill and depressed.  HEENT:  PERRL, EOMI, sclera are clear. Oropharynx is clear. Missing several upper teeth. NECK:  No jugular venous distention, carotid upstroke brisk and symmetric, no bruits, no thyromegaly or adenopathy LUNGS:  Clear to auscultation bilaterally CHEST:  Unremarkable HEART:  RRR,  PMI not displaced or sustained,S1 and S2 within normal limits, no S3, no S4: no clicks, no rubs, no murmurs ABD:  Soft, nontender. BS +, no masses or bruits. No hepatomegaly, no splenomegaly EXT:  2 + pulses throughout, no edema, no cyanosis no clubbing.  SKIN:  Warm and dry.  No rashes NEURO:  Alert and oriented x 3. Cranial nerves II through XII intact. PSYCH:   Cognitively intact    Lab Results  Component Value Date   WBC 3.9 (L) 01/11/2016   HGB 8.4 (L) 01/11/2016   HCT 24.2 (L) 01/11/2016   PLT 124 (L) 01/11/2016   GLUCOSE 96 01/11/2016   CHOL 147 07/31/2011   TRIG 64.0 07/31/2011   HDL 60.50 07/31/2011   LDLCALC 74 07/31/2011   ALT 13 (L) 01/03/2016   AST 19 01/03/2016   NA 141 01/11/2016   K 3.8 01/11/2016   CL 109 01/11/2016   CREATININE 1.12 01/11/2016   BUN 15 01/11/2016   CO2 26 01/11/2016   TSH 3.576 01/03/2016   INR 4.9 06/15/2016   Echo: 09/09/14:Study Conclusions  - HPI and indications: Afib with pauses noted  during the study. - Left ventricle: The cavity size was normal. Wall thickness was increased in a pattern of mild LVH. Systolic function was normal. The estimated ejection fraction was in the range of 55% to 60%. The study is not technically sufficient to allow evaluation of LV diastolic function. - Aortic valve: Bioprosthetic AVR. No obstruction. Trivial AI. - Mitral valve: Calcified annulus. Mildly thickened leaflets . There was mild regurgitation. - Left atrium: Severely dilated at 62 ml/m2. - Right ventricle: The cavity size was mildly dilated. Systolic function is reduced. - Right atrium: Moderately dilated at 26 cm2. - Tricuspid valve: There was mild regurgitation. - Pulmonary arteries: PA peak pressure: 29 mm Hg (S). - Inferior vena cava: The vessel was normal in size. The respirophasic diameter changes were in the normal range (>= 50%), consistent with normal central venous pressure.  Impressions:  - Compared to a prior study in 2012, the LVEF is improved to 55-60%, however, there are pauses noted and the underlying rhythm is a-fib. There is no evidence for obstruction of the bioprosthetic AVR. There is severe LAE and moderate RAE.  ASSESSMENT AND PLAN:   1. Chronic diastolic heart failure: Appears to be euvolemic on exam. Weight is down and no edema or rales. Continue  diuretic therapy with Lasix as prescribed along with daily weights and low-sodium diet.  2. Atrial fibrillation with a slow ventricular response:  s/p successful DCCV in September. Rhythm is regular today. Continue beta blocker and coumadin. INR adjustment per Dr. Rex Kras.   3. AS s/p tissue AVR in 9/12. Echo in 2016 showed good valve function.  4. CAD s/p CABG in 2000. Cath in 2012 showed patent grafts. No angina.  5. AAA s/p endograft with persistent endoleak. Manage conservatively.  6. S/p left CEA. Carotid dopplers in October showed no significant stenosis.  7. Spinal stenosis. S/p surgery.   8. GI bleed secondary to gastritis and gastric erosions. Felt to be related to ASA and NSAID use with Etoh use as well. Continue coumadin for now.  9. Gout. Resolved.  10. Dyspnea and fatigue on exertion. Will check CBC to make sure he is not more anemic. Also check chemistry panel and TSH.     Jalynne Persico Martinique MD,FACC  09/07/2016 5:59 PM

## 2016-09-07 ENCOUNTER — Encounter: Payer: Self-pay | Admitting: Cardiology

## 2016-09-07 ENCOUNTER — Ambulatory Visit (INDEPENDENT_AMBULATORY_CARE_PROVIDER_SITE_OTHER): Payer: Medicare HMO | Admitting: Cardiology

## 2016-09-07 VITALS — BP 130/68 | HR 70 | Ht 71.0 in | Wt 166.2 lb

## 2016-09-07 DIAGNOSIS — I482 Chronic atrial fibrillation, unspecified: Secondary | ICD-10-CM

## 2016-09-07 DIAGNOSIS — I10 Essential (primary) hypertension: Secondary | ICD-10-CM | POA: Diagnosis not present

## 2016-09-07 DIAGNOSIS — Z952 Presence of prosthetic heart valve: Secondary | ICD-10-CM

## 2016-09-07 DIAGNOSIS — I25810 Atherosclerosis of coronary artery bypass graft(s) without angina pectoris: Secondary | ICD-10-CM | POA: Diagnosis not present

## 2016-09-07 DIAGNOSIS — I5022 Chronic systolic (congestive) heart failure: Secondary | ICD-10-CM | POA: Diagnosis not present

## 2016-09-07 NOTE — Patient Instructions (Addendum)
We will check blood work today  Continue your current therapy  I will see you in 6 months 

## 2016-09-08 LAB — CBC WITH DIFFERENTIAL/PLATELET
BASOS ABS: 0 10*3/uL (ref 0.0–0.2)
Basos: 0 %
EOS (ABSOLUTE): 0.1 10*3/uL (ref 0.0–0.4)
Eos: 1 %
Hematocrit: 36.4 % — ABNORMAL LOW (ref 37.5–51.0)
Hemoglobin: 12 g/dL — ABNORMAL LOW (ref 13.0–17.7)
IMMATURE GRANULOCYTES: 0 %
Immature Grans (Abs): 0 10*3/uL (ref 0.0–0.1)
Lymphocytes Absolute: 0.9 10*3/uL (ref 0.7–3.1)
Lymphs: 23 %
MCH: 35.2 pg — ABNORMAL HIGH (ref 26.6–33.0)
MCHC: 33 g/dL (ref 31.5–35.7)
MCV: 107 fL — ABNORMAL HIGH (ref 79–97)
MONOS ABS: 0.4 10*3/uL (ref 0.1–0.9)
Monocytes: 9 %
NEUTROS PCT: 67 %
Neutrophils Absolute: 2.8 10*3/uL (ref 1.4–7.0)
PLATELETS: 138 10*3/uL — AB (ref 150–379)
RBC: 3.41 x10E6/uL — AB (ref 4.14–5.80)
RDW: 15.1 % (ref 12.3–15.4)
WBC: 4.2 10*3/uL (ref 3.4–10.8)

## 2016-09-08 LAB — COMPREHENSIVE METABOLIC PANEL
A/G RATIO: 1.9 (ref 1.2–2.2)
ALK PHOS: 80 IU/L (ref 39–117)
ALT: 19 IU/L (ref 0–44)
AST: 26 IU/L (ref 0–40)
Albumin: 4.3 g/dL (ref 3.5–4.7)
BUN/Creatinine Ratio: 14 (ref 10–24)
BUN: 15 mg/dL (ref 8–27)
Bilirubin Total: 0.8 mg/dL (ref 0.0–1.2)
CHLORIDE: 101 mmol/L (ref 96–106)
CO2: 22 mmol/L (ref 20–29)
Calcium: 10.4 mg/dL — ABNORMAL HIGH (ref 8.6–10.2)
Creatinine, Ser: 1.05 mg/dL (ref 0.76–1.27)
GFR calc Af Amer: 73 mL/min/{1.73_m2} (ref >59)
GFR calc non Af Amer: 63 mL/min/{1.73_m2} (ref >59)
GLOBULIN, TOTAL: 2.3 g/dL (ref 1.5–4.5)
Glucose: 87 mg/dL (ref 65–99)
POTASSIUM: 5 mmol/L (ref 3.5–5.2)
SODIUM: 140 mmol/L (ref 134–144)
Total Protein: 6.6 g/dL (ref 6.0–8.5)

## 2016-09-08 LAB — TSH: TSH: 5.32 u[IU]/mL — ABNORMAL HIGH (ref 0.450–4.500)

## 2016-10-11 ENCOUNTER — Other Ambulatory Visit: Payer: Self-pay | Admitting: Cardiology

## 2016-10-18 ENCOUNTER — Other Ambulatory Visit: Payer: Self-pay | Admitting: Cardiology

## 2016-10-18 NOTE — Telephone Encounter (Signed)
REFILL 

## 2016-11-28 ENCOUNTER — Other Ambulatory Visit: Payer: Self-pay | Admitting: Cardiology

## 2016-11-28 DIAGNOSIS — I6523 Occlusion and stenosis of bilateral carotid arteries: Secondary | ICD-10-CM

## 2016-12-03 ENCOUNTER — Other Ambulatory Visit: Payer: Self-pay | Admitting: Cardiology

## 2016-12-11 ENCOUNTER — Ambulatory Visit (HOSPITAL_COMMUNITY)
Admission: RE | Admit: 2016-12-11 | Discharge: 2016-12-11 | Disposition: A | Discharge status: Home / Self Care | Payer: Medicare HMO | Source: Ambulatory Visit | Attending: Cardiovascular Disease | Admitting: Cardiovascular Disease

## 2016-12-11 DIAGNOSIS — I6523 Occlusion and stenosis of bilateral carotid arteries: Secondary | ICD-10-CM | POA: Insufficient documentation

## 2017-01-28 ENCOUNTER — Encounter (HOSPITAL_COMMUNITY): Payer: Self-pay

## 2017-01-28 ENCOUNTER — Emergency Department (HOSPITAL_COMMUNITY): Payer: Medicare HMO

## 2017-01-28 ENCOUNTER — Emergency Department (HOSPITAL_COMMUNITY)
Admission: EM | Admit: 2017-01-28 | Discharge: 2017-01-29 | Disposition: A | Discharge status: Home / Self Care | Payer: Medicare HMO | Attending: Emergency Medicine | Admitting: Emergency Medicine

## 2017-01-28 DIAGNOSIS — K4020 Bilateral inguinal hernia, without obstruction or gangrene, not specified as recurrent: Secondary | ICD-10-CM | POA: Diagnosis not present

## 2017-01-28 DIAGNOSIS — I130 Hypertensive heart and chronic kidney disease with heart failure and stage 1 through stage 4 chronic kidney disease, or unspecified chronic kidney disease: Secondary | ICD-10-CM | POA: Insufficient documentation

## 2017-01-28 DIAGNOSIS — Z96651 Presence of right artificial knee joint: Secondary | ICD-10-CM | POA: Insufficient documentation

## 2017-01-28 DIAGNOSIS — I2510 Atherosclerotic heart disease of native coronary artery without angina pectoris: Secondary | ICD-10-CM | POA: Insufficient documentation

## 2017-01-28 DIAGNOSIS — N182 Chronic kidney disease, stage 2 (mild): Secondary | ICD-10-CM | POA: Diagnosis not present

## 2017-01-28 DIAGNOSIS — Z79899 Other long term (current) drug therapy: Secondary | ICD-10-CM | POA: Insufficient documentation

## 2017-01-28 DIAGNOSIS — E039 Hypothyroidism, unspecified: Secondary | ICD-10-CM | POA: Diagnosis not present

## 2017-01-28 DIAGNOSIS — Z951 Presence of aortocoronary bypass graft: Secondary | ICD-10-CM | POA: Diagnosis not present

## 2017-01-28 DIAGNOSIS — Z87891 Personal history of nicotine dependence: Secondary | ICD-10-CM | POA: Diagnosis not present

## 2017-01-28 DIAGNOSIS — I714 Abdominal aortic aneurysm, without rupture, unspecified: Secondary | ICD-10-CM

## 2017-01-28 DIAGNOSIS — I5022 Chronic systolic (congestive) heart failure: Secondary | ICD-10-CM | POA: Diagnosis not present

## 2017-01-28 DIAGNOSIS — R10817 Generalized abdominal tenderness: Secondary | ICD-10-CM | POA: Diagnosis present

## 2017-01-28 LAB — CBC WITH DIFFERENTIAL/PLATELET
BASOS ABS: 0 10*3/uL (ref 0.0–0.1)
BASOS PCT: 0 %
Eosinophils Absolute: 0.1 10*3/uL (ref 0.0–0.7)
Eosinophils Relative: 2 %
HEMATOCRIT: 35.4 % — AB (ref 39.0–52.0)
Hemoglobin: 12.5 g/dL — ABNORMAL LOW (ref 13.0–17.0)
LYMPHS PCT: 17 %
Lymphs Abs: 1 10*3/uL (ref 0.7–4.0)
MCH: 35.5 pg — ABNORMAL HIGH (ref 26.0–34.0)
MCHC: 35.3 g/dL (ref 30.0–36.0)
MCV: 100.6 fL — AB (ref 78.0–100.0)
Monocytes Absolute: 0.4 10*3/uL (ref 0.1–1.0)
Monocytes Relative: 6 %
NEUTROS ABS: 4.2 10*3/uL (ref 1.7–7.7)
Neutrophils Relative %: 75 %
PLATELETS: 128 10*3/uL — AB (ref 150–400)
RBC: 3.52 MIL/uL — AB (ref 4.22–5.81)
RDW: 13.7 % (ref 11.5–15.5)
WBC: 5.6 10*3/uL (ref 4.0–10.5)

## 2017-01-28 LAB — I-STAT CHEM 8, ED
BUN: 14 mg/dL (ref 6–20)
CHLORIDE: 99 mmol/L — AB (ref 101–111)
Calcium, Ion: 1.22 mmol/L (ref 1.15–1.40)
Creatinine, Ser: 0.9 mg/dL (ref 0.61–1.24)
GLUCOSE: 96 mg/dL (ref 65–99)
HEMATOCRIT: 39 % (ref 39.0–52.0)
Hemoglobin: 13.3 g/dL (ref 13.0–17.0)
POTASSIUM: 3.8 mmol/L (ref 3.5–5.1)
Sodium: 136 mmol/L (ref 135–145)
TCO2: 24 mmol/L (ref 22–32)

## 2017-01-28 LAB — PROTIME-INR
INR: 1.77
PROTHROMBIN TIME: 20.4 s — AB (ref 11.4–15.2)

## 2017-01-28 LAB — I-STAT TROPONIN, ED: TROPONIN I, POC: 0.01 ng/mL (ref 0.00–0.08)

## 2017-01-28 MED ORDER — MORPHINE SULFATE (PF) 4 MG/ML IV SOLN
4.0000 mg | Freq: Once | INTRAVENOUS | Status: AC
  Administered 2017-01-28: 4 mg via INTRAVENOUS
  Filled 2017-01-28: qty 1

## 2017-01-28 MED ORDER — ONDANSETRON HCL 4 MG/2ML IJ SOLN
4.0000 mg | Freq: Once | INTRAMUSCULAR | Status: AC
  Administered 2017-01-28: 4 mg via INTRAVENOUS
  Filled 2017-01-28: qty 2

## 2017-01-28 MED ORDER — IOPAMIDOL (ISOVUE-300) INJECTION 61%
INTRAVENOUS | Status: AC
  Administered 2017-01-28: 100 mL
  Filled 2017-01-28: qty 100

## 2017-01-28 NOTE — ED Notes (Signed)
Ice applied to abdominal area per MD order

## 2017-01-28 NOTE — ED Triage Notes (Signed)
Pt here via [facility] with c/o upper abd pain x yesterday am. Gradually worse over today. States that it is a cramping pain. EMS states that pt told them he took a laxative 2 hours ago with no results. Pt is sob while talking currently. States that he has a hx of this but possibly worse tonight. A&O x 4.

## 2017-01-28 NOTE — ED Provider Notes (Signed)
[facility] EMERGENCY DEPARTMENT Provider Note   CSN: [id number] Arrival date & time: 01/28/17  2158     History   Chief Complaint No chief complaint on file.   HPI [name] is a 83 y.o. male.  HPI   83 year old male with history of AAA status post stent graft, atrial fibrillation currently on Coumadin, CHF, CAD, emphysema presenting for evaluation of abdominal pain.  Patient report having developed pain to his mid abdomen since yesterday morning.  Pain has been persistent but waxing and waning.  Pain is currently mild but have not resolved.  He also noticed swelling to his abdomen which is new.  He endorsed mild shortness of breath secondary to pain.  He denies having fever, chills, lightheadedness, dizziness, chest pain, nausea, vomiting, diarrhea, constipation.  Last bowel movement was yesterday and was normal.  He is able to pass flatus.  Denies any prior history of abdominal hernia.  Denies any localized leg pain or numbness.  Past Medical History:  Diagnosis Date  . AAA (abdominal aortic aneurysm) ([place])    s/p stent graft in September 2009 & with attempted embolization/occlusion of vessels for a type 2 leak around the stent graft; Managed by Dr. Oneida Alar; last CT in 2012 showing the leak had closed.   . Anemia    on iron pills  . Aortic stenosis    s/p AVR in September 2012 per Dr. Cyndia Bent  . Arthritis   . Atrial fibrillation [facility])    s/p cardioversion October 2012  . CHF (congestive heart failure) ([place])    SEPT AND OCT 2012; follow up echo in October shows EF of 50%.   . Chronic anticoagulation   . Coronary artery disease    Remote CABG x 5 in 2000  . Emphysema of lung ([place])   . Gallstones    s/p cholecystectomy in Dec 2012  . GI bleeding 01/03/2016  . Gout   . High risk medication use    on amiodarone  . History of carotid stenosis    s/p L CEA in September 2006  . Hyperlipidemia   . Hypertension   . Inguinal hernia    RIH  . PAT  (paroxysmal atrial tachycardia) ([place])   . PVC's (premature ventricular contractions)   . Stroke [facility]) 2006   three strokes -no residuals  . Urinary frequency    at night  . Wears glasses     Patient Active Problem List   Diagnosis Date Noted  . Gout attack 01/10/2016  . Constipation 01/10/2016  . Thrombocytopenia ([place]) 01/10/2016  . Hx of CABG   . History of completed stroke   . Status post abdominal aortic aneurysm (AAA) repair   . CKD stage G2/A2, GFR 60-89 and albumin creatinine ratio 30-299 mg/g   . Gastrointestinal hemorrhage 01/03/2016  . Chronic systolic heart failure ([place]) 11/10/2014  . Chronic anticoagulation 09/14/2014  . Acute on chronic systolic CHF (congestive heart failure) ([place]) 09/09/2014  . Essential hypertension   . Shortness of breath   . Hypertensive urgency 09/08/2014  . Epistaxis 01/15/2014  . AAA (abdominal aortic aneurysm) ([place]) 05/22/2012  . Occlusion and stenosis of carotid artery without mention of cerebral infarction 12/14/2011  . S/P AVR (aortic valve replacement) 07/31/2011  . Hypothyroidism 04/28/2011  . Abdominal pain 12/26/2010  . Dilated cardiomyopathy ([place]) 11/16/2010  . Coronary artery disease involving native coronary artery of native heart without angina pectoris   . HTN (hypertension), benign   . Hyperlipidemia   .  Aortic valve disorders 11/08/2010  . Chronic atrial fibrillation ([place]) 10/31/2010  . On warfarin therapy 10/31/2010  . Inguinal hernia unilateral, non-recurrent 10/03/2010  . ATHEROSCLEROTIC CARDIOVASCULAR DISEASE 10/01/2008  . NONSPECIFIC ABN FINDING RAD & OTH EXAM GI TRACT 10/01/2008    Past Surgical History:  Procedure Laterality Date  . ABDOMINAL AORTAGRAM N/A 12/25/2011   Procedure: ABDOMINAL Maxcine Ham;  Surgeon: Elam Dutch, MD;  Location[facility] C[facility] LAB;  Service: Cardiovascular;  Laterality: N/A;  . ABDOMINAL AORTIC ANEURYSM REPAIR  2008   Gore Excluder Stent Graft repair  . ABDOMINAL AORTIC ANEURYSM REPAIR   2010/2012  . AORTIC VALVE REPLACEMENT  10/24/10   AVR  #25 mm Magna Ease pericardial valve  . CARDIAC CATHETERIZATION  04/29/1998   EF 60%  . CARDIOVASCULAR STRESS TEST  08/19/2009  . CARDIOVERSION N/A 11/11/2014   Procedure: CARDIOVERSION;  Surgeon: [name], MD;  Location: [place];  Service: Cardiovascular;  Laterality: N/A;  . CAROTID ENDARTERECTOMY  2006   Left Side  . CHOLECYSTECTOMY  01/23/2011   Procedure: LAPAROSCOPIC CHOLECYSTECTOMY WITH INTRAOPERATIVE CHOLANGIOGRAM;  Surgeon: Rolm Bookbinder, MD;  Location: [facility] ORS;  Service: General;  Laterality: N/A;  . COLON SURGERY     lap right colon  . CORONARY ARTERY BYPASS GRAFT  1970   bypass and open heart surgery--pt states this is incorrect--his only cabg was in 2000  . CORONARY ARTERY BYPASS GRAFT  2000   5 vessel BY DR.BARTEL. [place] GRAFT TO THE LAD, SEQUENTIAL SAPHENOUS VEIN GRAFT TO THE  FIRST DIAGONAL AND FIRST OBTUSE MARGINAL VESSELS, SAPHENOUS VEIN GRAFT TO THE SECOND OBTUSE MARGINAL VESSEL, AND SAPHENOUS VEIN GRAFT TO THE PDA  . ESOPHAGOGASTRODUODENOSCOPY N/A 01/05/2016   Procedure: ESOPHAGOGASTRODUODENOSCOPY (EGD);  Surgeon: Otis Brace, MD;  Location: [place];  Service: Gastroenterology;  Laterality: N/A;  . EYE SURGERY     bilateral cataract extractions  . HEMORRHOID SURGERY    . HEMORRHOID SURGERY    . HERNIA REPAIR  10/13/10   RIH  . JOINT REPLACEMENT     Right TKR  . REPLACEMENT TOTAL KNEE  2008  . TONSILLECTOMY    . US ECHOCARDIOGRAPHY  08/18/2009   EF 55-60%  . US ECHOCARDIOGRAPHY  04/22/2007   EF 55-60%       Home Medications    Prior to Admission medications   Medication Sig Start Date End Date Taking? Authorizing Provider  allopurinol (ZYLOPRIM) 100 MG tablet Take 1 tablet (100 mg total) by mouth daily. 02/16/16   [place], Peter M, MD  carvedilol (COREG) 6.25 MG tablet TAKE ONE TABLET BY MOUTH TWICE DAILY WITH MEALS 02/29/16   [place], Peter M, MD  clotrimazole ([facility] RELIEF) 1 % cream  Apply 1 application topically as needed.    [provider]  colchicine 0.6 MG tablet Take 1 tablet (0.6 mg total) by mouth daily. 01/11/16   [name], MD  cyanocobalamin (,VITAMIN B-12,) 1000 MCG/ML injection Inject 1,000 mcg into the muscle every 30 (thirty) days.    [provider]  dorzolamide-timolol (COSOPT) 22.3-6.8 MG/ML ophthalmic solution 1 drop 2 (two) times daily.    [provider]  doxylamine, Sleep, (UNISOM) 25 MG tablet Take 25 mg by mouth at bedtime.    [provider]  ferrous sulfate 325 (65 FE) MG tablet TAKE 1 TABLET BY MOUTH ONCE DAILY WITH BREAKFAST 12/04/16   [place], Peter M, MD  furosemide (LASIX) 20 MG tablet Take 1 tablet (20 mg total) by mouth daily as needed for edema (  for edema and shortness of breath). 03/27/16   [place], Peter M, MD  latanoprost (XALATAN) 0.005 % ophthalmic solution Place 1 drop into both eyes at bedtime.    [provider]  levothyroxine (SYNTHROID, LEVOTHROID) 25 MCG tablet TAKE 1 TABLET BY MOUTH ONCE DAILY BEFORE BREAKFAST 10/18/16   [place], Peter M, MD  losartan (COZAAR) 25 MG tablet TAKE ONE TABLET BY MOUTH ONCE DAILY 07/06/16   [place], Peter M, MD  Multiple Vitamin (MULTIVITAMIN) tablet Take 1 tablet by mouth 2 (two) times daily.     [provider]  Naftifine HCl 2 % CREA APPLY TO THE AFFECTED AREA ON THE SKIN TWICE DAILY 08/31/16   [provider]  Oxymetazoline HCl (NASAL SPRAY) 0.05 % SOLN Place into the nose as needed.    [provider]  pantoprazole (PROTONIX) 40 MG tablet Take 1 tablet (40 mg total) by mouth daily. 01/11/16   [name], MD  polyethylene glycol [facility] Floria Raveling) packet Take 17 g by mouth daily. 01/11/16   [name], MD  tacrolimus (PROTOPIC) 0.1 % ointment  08/08/16   [provider]  warfarin (COUMADIN) 5 MG tablet Take 1 and 1/2 tablet (7.5mg ) every Sunday and Thursday and 1 tablet (5mg ) all other days of the week. 08/18/16   [place],  Peter M, MD    Family History Family History  Problem Relation Age of Onset  . Other Mother        falopian tube during pregnancy   . Heart disease Father   . Cancer Brother        liver     Social History Social History   Tobacco Use  . Smoking status: Former Research scientist (life sciences)  . Smokeless tobacco: Never Used  . Tobacco comment: pt states he smokes 3-4 cigars every day  Substance Use Topics  . Alcohol use: Yes    Alcohol/week: 1.2 oz    Types: 2 Cans of beer per week  . Drug use: No     Allergies   Patient has no known allergies.   Review of Systems Review of Systems  All other systems reviewed and are negative.    Physical Exam Updated Vital Signs There were no vitals taken for this visit.  Physical Exam  Constitutional: He is oriented to person, place, and time. He appears well-developed and well-nourished. No distress.  Elderly male nontoxic in appearance  HENT:  Head: Atraumatic.  Eyes: Conjunctivae are normal.  Neck: Neck supple.  Cardiovascular: Normal rate and regular rhythm.  Pulmonary/Chest: Effort normal and breath sounds normal.  Abdominal:  Abdomen is soft.  A ventral hernia noted to the mid abdomen that is tender to palpation.  Bowel sounds present.  No pulsatile mass appreciated.  Neurological: He is alert and oriented to person, place, and time.  Skin: Skin is warm. Capillary refill takes less than 2 seconds. No rash noted.  Psychiatric: He has a normal mood and affect.  Nursing note and vitals reviewed.    ED Treatments / Results  Labs (all labs ordered are listed, but only abnormal results are displayed) Labs Reviewed  CBC WITH DIFFERENTIAL/PLATELET - Abnormal; Notable for the following components:      Result Value   RBC 3.52 (*)    Hemoglobin 12.5 (*)    HCT 35.4 (*)    MCV 100.6 (*)    MCH 35.5 (*)    Platelets 128 (*)    All other components within normal limits  PROTIME-INR - Abnormal; Notable for  the following components:    Prothrombin Time 20.4 (*)    All other components within normal limits  I-STAT CHEM 8, ED - Abnormal; Notable for the following components:   Chloride 99 (*)    All other components within normal limits  I-STAT TROPONIN, ED    EKG  EKG Interpretation None       Radiology Ct Abdomen Pelvis W Contrast  Result Date: 01/29/2017 CLINICAL DATA:  Upper abdominal pain for 2 days. Umbilical hernia first 10 years. EXAM: CT ABDOMEN AND PELVIS WITH CONTRAST TECHNIQUE: Multidetector CT imaging of the abdomen and pelvis was performed using the standard protocol following bolus administration of intravenous contrast. CONTRAST:  142mL ISOVUE-300 IOPAMIDOL (ISOVUE-300) INJECTION 61% COMPARISON:  12/14/2011 CTA of the abdomen and pelvis. FINDINGS: Lower chest: Stable cardiomegaly without pericardial effusion. The patient is status post median sternotomy and aortic valvular replacement. Left main and three-vessel coronary arteriosclerosis is noted to the extent visible. There is bibasilar atelectasis. Tiny stable subpleural lingular nodule measuring 3 mm is re- demonstrated. Hepatobiliary: Hepatic steatosis. Cholecystectomy. No enhancing hepatic mass or biliary dilatation. Pancreas: No ductal dilatation the pancreas. No enhancing mass lesions are seen. Spleen: No splenomegaly or mass. Adrenals/Urinary Tract: Normal bilateral adrenal glands. Mild renal cortical thinning with water attenuating cysts identified bilaterally off both kidneys, the largest is on the left measuring approximately 1.5 cm. A tiny too small to further characterize interpolar cyst is seen of the right kidney measuring 0.6 cm. No hydroureteronephrosis. The urinary bladder is unremarkable. Stomach/Bowel: Moderate to marked fluid-filled distention of the stomach with mild to moderate fluid-filled distention of small bowel loops. There is a supraumbilical hernia containing a small segment small intestine, the hernia sac measuring 5 x 3 x 3.5 cm.  Correlate to determine whether this is reducible otherwise, incarcerated supraumbilical ventral hernia GB considered given the rather diffuse small bowel dilatation noted. Suture line involving the right colon likely from prior partial right colectomy. Vascular/Lymphatic: The native abdominal aorta is atherosclerotic. There is a patent infrarenal aorto bi-iliac stent graft re- demonstrated with slight interval increase in size of the native infrarenal abdominal aortic aneurysm surrounding the aortic stent, currently measuring 7 x 7.3 cm in transverse by AP dimension versus 6.7 x 7 cm previously. Thin wispy contrast is seen emanating from the graft into the aneurysm sac likely representing a type 2 endoleak, series 3 image 52 and 53. Metallic coils are demonstrated along the right side of the sac with high-density material re- demonstrated extending to a right artery consistent with prior coiling and embolization. Visceral vessels off the native aorta are patent although moderately atherosclerotic. Inflow and outflow are patent to the common femoral artery level. Reproductive: Mildly enlarged prostate with central zone calcification. Other: No free air nor free fluid. Musculoskeletal: Thoracolumbar spondylosis with multilevel degenerative disc disease. No acute nor suspicious osseous lesions. IMPRESSION: 1. A supraumbilical ventral hernia sac is noted containing a short segment of small intestine measuring 5 x 3 x 3.5 cm. This may be contributing to mild fluid-filled distention of small bowel. Clinical correlation to determine whether this is reducible is suggested as incarceration is not entirely excluded. 2. Slightly larger native infrarenal aortic aneurysm measuring 7 x 7.3 cm versus 6.7 x 7 cm previously. A pre-existing aorto bi-iliac stent graft is noted with thin wispy contrast emanating from the stent into the native hernia sac consistent with a type 2 endoleak. These results were called by telephone at the  time of interpretation on 01/29/2017 at  12:48 am to [facility] , who verbally acknowledged these results. 3. Mild bilateral renal cortical thinning with bilateral renal cysts. 4. Hepatic steatosis.  Status post cholecystectomy. 5. Thoracolumbar spondylosis. Followup:  During our discussion, the hernia was able to be reduced. Electronically Signed   By: Ashley Royalty M.D.   On: 01/29/2017 00:49    Procedures Hernia reduction Date/Time: 01/29/2017 12:59 AM Performed by: Domenic Moras, PA-C Authorized by: Domenic Moras, PA-C  Consent: Verbal consent obtained. Risks and benefits: risks, benefits and alternatives were discussed Consent given by: patient Patient understanding: patient states understanding of the procedure being performed Patient consent: the patient's understanding of the procedure matches consent given Procedure consent: procedure consent matches procedure scheduled Imaging studies: imaging studies available Patient identity confirmed: verbally with patient and arm band Time out: Immediately prior to procedure a "time out" was called to verify the correct patient, procedure, equipment, support staff and site/side marked as required. Local anesthesia used: no  Anesthesia: Local anesthesia used: no  Sedation: Patient sedated: no  Patient tolerance: Patient tolerated the procedure well with no immediate complications Comments: Abdominal ventral hernia which was reduced by me by using direct pressure for approximately 5 minutes.  Pt tolerates well and felt much better.     (including critical care time)  Medications Ordered in ED Medications  morphine 4 MG/ML injection 4 mg (4 mg Intravenous Given 01/28/17 2329)  ondansetron (ZOFRAN) injection 4 mg (4 mg Intravenous Given 01/28/17 2329)  iopamidol (ISOVUE-300) 61 % injection (100 mLs  Contrast Given 01/28/17 2353)     Initial Impression / Assessment and Plan / ED Course  I have reviewed the triage vital signs and the  nursing notes.  Pertinent labs & imaging results that were available during my care of the patient were reviewed by me and considered in my medical decision making (see chart for details).     BP (!) 193/82   Pulse 64   Temp 97.6 F (36.4 C) (Oral)   Resp (!) 25   SpO2 94%   The patient was noted to be hypertensive today in the emergency department. I have spoken with the patient regarding hypertension and the need for improved management. I instructed the patient to followup with the Primary care doctor within 4 days to improve the management of the patient's hypertension. I also counseled the patient regarding the signs and symptoms which would require an emergent visit to an emergency department for hypertensive urgency and/or hypertensive emergency. The patient understood the need for improved hypertensive management.    Final Clinical Impressions(s) / ED Diagnoses   Final diagnoses:  Bilateral inguinal hernia without obstruction or gangrene, recurrence not specified  Abdominal aortic aneurysm (AAA) without rupture [facility])    ED Discharge Orders    None     10:28 PM Patient here with abdominal pain.  It appears he has a ventral hernia which he does not tolerate is trying to reduce.  He does have history of AAA with last CT in 2013 stating that it measures 7 cm.  I discussed with on-call radiologist in regards to imaging, he recommend abdominal pelvis CT scan with IV contrast for further evaluation.  Will give patient pain medication and will apply ice to the overlying area.  Will attempt to reduce it once patient tolerates better. Care discussed with Dr. Dayna Barker.   1:00 AM CT scan of the abdomen and pelvis demonstrate a supraumbilical ventral hernia.  Also a slightly larger native infrarenal aortic aneurysm  that appears to be slightly larger than previous measurement.  Evidence of a type II endoleak were noted.  The hernia was reduced by me.  Patient felt much better.  Will consult  general surgery for outpatient management.  I will also consult vascular surgery in regards to the aneurysm.  However, I do not think patient's symptoms is related to his aneurysm at this time.  He is hemodynamically stable.  1:40 AM Appreciate consultation from general surgeon who recommend outpatient follow-up for further management of patient's hernia.  I also have consult vascular surgeon, Dr. early, who also recommend outpatient follow-up as the finding is not concerning for acute pathology.  Patient is comfortable going home.  Return precautions discussed.   Domenic Moras, PA-C 01/29/17 0144    Mesner, Corene Cornea, MD 01/31/17 [id number]

## 2017-01-28 NOTE — ED Notes (Signed)
ED Provider at bedside. Gertie Fey

## 2017-01-29 NOTE — ED Notes (Signed)
Patient transported to CT 

## 2017-01-29 NOTE — ED Provider Notes (Signed)
Medical screening examination/treatment/procedure(s) were conducted as a shared visit with non-physician practitioner(s) and myself.  I personally evaluated the patient during the encounter.  Has new swelling and anterior abdominal pain.  On my exam ttp over ventral hernia. Not reducible. No fevers. Normal BM's, no nausea and vomiting.  Plan for pain meds, ct scan and attempt at reduction.    Merrily Pew, MD 01/31/17 [id number]

## 2017-01-29 NOTE — ED Notes (Signed)
Patient denies pain and is resting comfortably.  

## 2017-01-29 NOTE — Discharge Instructions (Signed)
You have an abdominal ventral hernia.  Please call and follow up with Dr. Donne Hazel for further management.  Also, the vascular surgeon will reach out to you for follow up of your abdominal hernia. Return if your condition worsen or if you have other concerns.

## 2017-02-11 ENCOUNTER — Other Ambulatory Visit: Payer: Self-pay | Admitting: Cardiology

## 2017-02-21 ENCOUNTER — Other Ambulatory Visit: Payer: Self-pay | Admitting: Cardiology

## 2017-03-15 ENCOUNTER — Other Ambulatory Visit: Payer: Self-pay | Admitting: Cardiology

## 2017-03-20 NOTE — Progress Notes (Deleted)
03/20/2017 [name]   1926-11-24  [id number]  Primary Physician Tamsen Roers, MD Primary Cardiologist: Dr. Martinique   Reason for Visit/CC:  F/u for  Atrial Fibrillation and CAD  HPI:  The patient is a 84 year old male seen for follow up  atrial fibrillation. He has a history of coronary disease and is status post CABG in 2000. Cardiac catheterization September 2012 showed patent grafts. He developed severe aortic stenosis and underwent aortic valve replacement with a tissue prosthesis. Prior to surgery he had congestive heart failure with ejection fraction 25%. Postoperatively his ejection fraction improved to 45%. He did have atrial fibrillation during that time and was placed on amiodarone and Coumadin. He was cardioverted in October of 2012. His amiodarone was then discontinued. He also has an abdominal aortic aneurysm and is status post stent grafting in 2009. He has a history of type II endoleak and had a coil procedure on one occasion. 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 This is being managed conservatively. He is s/p left CEA.  In July 2016 he was admitted with acute diastolic CHF and atrial fibrillation. He was severely hypertensive. He was treated with rate control and anticoagulation. He underwent DCCV on 11/11/14.  He was admitted from 11/20-11/28/17 with orthostatic syncope and a GI bleed. Heme positive stool. Hgb dropped to 6.7. He was transfused with improvement in Hgb to 8.2 which remained stable. Coumadin was reversed. Upper EGD showed multiple small gastric erosions and gastritis. H.pylori negative. Risk factors of ASA and daily NSAID use noted as well as Etoh consumption. ASA and NSAIDs stopped. Coumadin resumed.  He also had flair of gout. Ecg showed AFib with LBBB. Rate controlled. CXR without acute findings.   He was seen in the ED in December with inguinal hernia. It was reduced. Seen by general surgery but felt to be high risk for surgery  so conservative management recommended.   On follow up today he is seen with his wife. Gout has resolved. No further bleeding. Has abstained from Etoh and NSAIDs. He has lost 4 lbs. Wife notes his activity level is very poor. He struggles to walk from one side of the house to the other. He still does chair exercises daily- using a pedaling machine and lifting dumbbells but with walking he gets fatigued and SOB. No increased edema. Wife reports that once a week he feels so bad that he wished it was all over. Long longer wants to go outside. Last INR 1.9.   Current Outpatient Medications  Medication Sig Dispense Refill  . acetaminophen (TYLENOL) 500 MG tablet Take 500 mg by mouth daily before breakfast.    . allopurinol (ZYLOPRIM) 100 MG tablet TAKE ONE TABLET BY MOUTH ONCE DAILY 90 tablet 3  . aspirin EC 81 MG tablet Take 81 mg by mouth daily.    . bisacodyl (BISACODYL) 5 MG EC tablet Take 5 mg by mouth daily as needed for moderate constipation.    . Carboxymeth-Glycerin-Polysorb (REFRESH OPTIVE ADVANCED) 0.5-1-0.5 % SOLN Place 1 drop into both eyes See admin instructions. Instill one drop into both eyes twice daily two minutes before administering other eye drops (to reduce irritation)    . carvedilol (COREG) 6.25 MG tablet TAKE ONE TABLET BY MOUTH TWICE DAILY WITH MEALS 180 tablet 2  . colchicine 0.6 MG tablet Take 1 tablet (0.6 mg total) by mouth daily. (Patient taking differently: Take 0.6 mg by mouth daily as needed (gout flare). ) 30 tablet 0  .  cyanocobalamin (,VITAMIN B-12,) 1000 MCG/ML injection Inject 1,000 mcg into the muscle See admin instructions. Inject 1000 mcg intramuscularly every 60 days (next injection due 01/30/17)    . dorzolamide-timolol (COSOPT) 22.3-6.8 MG/ML ophthalmic solution Place 1 drop into both eyes 2 (two) times daily.     . ferrous sulfate 325 (65 FE) MG tablet TAKE 1 TABLET BY MOUTH ONCE DAILY WITH BREAKFAST (Patient taking differently: TAKE 1 TABLET (325 MG) BY MOUTH  ONCE DAILY WITH BREAKFAST) 30 tablet 9  . folic acid (FOLVITE) 1 MG tablet Take 1 mg by mouth daily.    . furosemide (LASIX) 20 MG tablet TAKE 1 TABLET BY MOUTH ONCE DAILY AS NEEDED FOR EDEMA AND SHORTNESS OF BREATH 90 tablet 3  . ketoconazole (NIZORAL) 2 % cream Apply 1 application topically 2 (two) times daily.    [name] latanoprost (XALATAN) 0.005 % ophthalmic solution Place 1 drop into both eyes at bedtime.    [name] levothyroxine (SYNTHROID, LEVOTHROID) 25 MCG tablet TAKE 1 TABLET BY MOUTH ONCE DAILY BEFORE BREAKFAST (Patient taking differently: TAKE 1 TABLET (25 MCG) BY MOUTH ONCE DAILY BEFORE BREAKFAST) 90 tablet 2  . losartan (COZAAR) 25 MG tablet TAKE ONE TABLET BY MOUTH ONCE DAILY (Patient taking differently: TAKE ONE TABLET (25 MG) BY MOUTH ONCE DAILY) 90 tablet 3  . Multiple Vitamin (MULTIVITAMIN WITH MINERALS) TABS tablet Take 1 tablet by mouth 2 (two) times daily.    [name] OVER THE COUNTER MEDICATION Place 1 spray into both nostrils daily. Over the counter nasal spray    . pantoprazole (PROTONIX) 40 MG tablet Take 1 tablet (40 mg total) by mouth daily. 60 tablet 2  . polyethylene glycol (MIRALAX / GLYCOLAX) packet Take 17 g by mouth daily. (Patient not taking: Reported on 01/29/2017) 14 each 4  . warfarin (COUMADIN) 5 MG tablet Take 1 and 1/2 tablet (7.5mg ) every Sunday and Thursday and 1 tablet (5mg ) all other days of the week. (Patient taking differently: Take 5-7.5 mg by mouth See admin instructions. Take 1 1/2 tablet (7.5 mg) by mouth on Sunday and Thursday night, take 1 tablet (5 mg) on all other nights of the week) 32 tablet 1   No current facility-administered medications for this visit.     No Known Allergies  Social History   Socioeconomic History  . Marital status: Married    Spouse name: Not on file  . Number of children: Not on file  . Years of education: Not on file  . Highest education level: Not on file  Social Needs  . Financial resource strain: Not on file  . Food  insecurity - worry: Not on file  . Food insecurity - inability: Not on file  . Transportation needs - medical: Not on file  . Transportation needs - non-medical: Not on file  Occupational History  . Not on file  Tobacco Use  . Smoking status: Former Research scientist (life sciences)  . Smokeless tobacco: Never Used  . Tobacco comment: pt states he smokes 3-4 cigars every day  Substance and Sexual Activity  . Alcohol use: Yes    Alcohol/week: 1.2 oz    Types: 2 Cans of beer per week  . Drug use: No  . Sexual activity: Not on file  Other Topics Concern  . Not on file  Social History Narrative  . Not on file     Review of Systems: As noted in HPI. All other systems reviewed and are otherwise negative except as noted above.    There were no  vitals taken for this visit.  GENERAL:  Elderly WM who appears chronically ill and depressed.  HEENT:  PERRL, EOMI, sclera are clear. Oropharynx is clear. Missing several upper teeth. NECK:  No jugular venous distention, carotid upstroke brisk and symmetric, no bruits, no thyromegaly or adenopathy LUNGS:  Clear to auscultation bilaterally CHEST:  Unremarkable HEART:  RRR,  PMI not displaced or sustained,S1 and S2 within normal limits, no S3, no S4: no clicks, no rubs, no murmurs ABD:  Soft, nontender. BS +, no masses or bruits. No hepatomegaly, no splenomegaly EXT:  2 + pulses throughout, no edema, no cyanosis no clubbing.  SKIN:  Warm and dry.  No rashes NEURO:  Alert and oriented x 3. Cranial nerves II through XII intact. PSYCH:  Cognitively intact    Lab Results  Component Value Date   WBC 5.6 01/28/2017   HGB 13.3 01/28/2017   HCT 39.0 01/28/2017   PLT 128 (L) 01/28/2017   GLUCOSE 96 01/28/2017   CHOL 147 07/31/2011   TRIG 64.0 07/31/2011   HDL 60.50 07/31/2011   LDLCALC 74 07/31/2011   ALT 19 09/07/2016   AST 26 09/07/2016   NA 136 01/28/2017   K 3.8 01/28/2017   CL 99 (L) 01/28/2017   CREATININE 0.90 01/28/2017   BUN 14 01/28/2017   CO2 22  09/07/2016   TSH 5.320 (H) 09/07/2016   INR 1.77 01/28/2017   Echo: 09/09/14:Study Conclusions  - HPI and indications: Afib with pauses noted during the study. - Left ventricle: The cavity size was normal. Wall thickness was increased in a pattern of mild LVH. Systolic function was normal. The estimated ejection fraction was in the range of 55% to 60%. The study is not technically sufficient to allow evaluation of LV diastolic function. - Aortic valve: Bioprosthetic AVR. No obstruction. Trivial AI. - Mitral valve: Calcified annulus. Mildly thickened leaflets . There was mild regurgitation. - Left atrium: Severely dilated at 62 ml/m2. - Right ventricle: The cavity size was mildly dilated. Systolic function is reduced. - Right atrium: Moderately dilated at 26 cm2. - Tricuspid valve: There was mild regurgitation. - Pulmonary arteries: PA peak pressure: 29 mm Hg (S). - Inferior vena cava: The vessel was normal in size. The respirophasic diameter changes were in the normal range (>= 50%), consistent with normal central venous pressure.  Impressions:  - Compared to a prior study in 2012, the LVEF is improved to 55-60%, however, there are pauses noted and the underlying rhythm is a-fib. There is no evidence for obstruction of the bioprosthetic AVR. There is severe LAE and moderate RAE.  ASSESSMENT AND PLAN:   1. Chronic diastolic heart failure: Appears to be euvolemic on exam. Weight is down and no edema or rales. Continue diuretic therapy with Lasix as prescribed along with daily weights and low-sodium diet.  2. Atrial fibrillation with a slow ventricular response:  s/p successful DCCV in September. Rhythm is regular today. Continue beta blocker and coumadin. INR adjustment per Dr. Rex Kras.   3. AS s/p tissue AVR in 9/12. Echo in 2016 showed good valve function.  4. CAD s/p CABG in 2000. Cath in 2012 showed patent grafts. No angina.  5. AAA s/p endograft with  persistent endoleak. Manage conservatively.  6. S/p left CEA. Carotid dopplers in October showed no significant stenosis.  7. Spinal stenosis. S/p surgery.   8. GI bleed secondary to gastritis and gastric erosions. Felt to be related to ASA and NSAID use with Etoh use as well. Continue  coumadin for now.  9. Gout. Resolved.  10. Dyspnea and fatigue on exertion. Will check CBC to make sure he is not more anemic. Also check chemistry panel and TSH.     Peter Martinique MD,FACC  03/20/2017 7:40 AM

## 2017-03-21 ENCOUNTER — Ambulatory Visit: Payer: Medicare HMO | Admitting: Cardiology

## 2017-03-21 ENCOUNTER — Telehealth: Payer: Self-pay | Admitting: Cardiology

## 2017-03-21 NOTE — Telephone Encounter (Signed)
Closed Encounter  °

## 2017-03-22 ENCOUNTER — Ambulatory Visit: Payer: Medicare HMO | Admitting: Cardiology

## 2017-03-25 NOTE — Progress Notes (Deleted)
03/25/2017 [name]   November 01, 1926  [id number]  Primary Physician Tamsen Roers, MD Primary Cardiologist: Dr. Martinique   Reason for Visit/CC:  F/u for  Atrial Fibrillation and CAD  HPI:  The patient is a 84 year old male seen for follow up  atrial fibrillation. He has a history of coronary disease and is status post CABG in 2000. Cardiac catheterization September 2012 showed patent grafts. He developed severe aortic stenosis and underwent aortic valve replacement with a tissue prosthesis. Prior to surgery he had congestive heart failure with ejection fraction 25%. Postoperatively his ejection fraction improved to 45%. He did have atrial fibrillation during that time and was placed on amiodarone and Coumadin. He was cardioverted in October of 2012. His amiodarone was then discontinued. He also has an abdominal aortic aneurysm and is status post stent grafting in 2009. He has a history of type II endoleak and had a coil procedure on one occasion. 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 This is being managed conservatively. He is s/p left CEA.  In July 2016 he was admitted with acute diastolic CHF and atrial fibrillation. He was severely hypertensive. He was treated with rate control and anticoagulation. He underwent DCCV on 11/11/14.  He was admitted from 11/20-11/28/17 with orthostatic syncope and a GI bleed. Heme positive stool. Hgb dropped to 6.7. He was transfused with improvement in Hgb to 8.2 which remained stable. Coumadin was reversed. Upper EGD showed multiple small gastric erosions and gastritis. H.pylori negative. Risk factors of ASA and daily NSAID use noted as well as Etoh consumption. ASA and NSAIDs stopped. Coumadin resumed.  He also had flair of gout. Ecg showed AFib with LBBB. Rate controlled. CXR without acute findings.   He was seen in the ED in December with inguinal hernia. It was reduced. Seen by general surgery but felt to be high risk for  surgery so conservative management recommended.   On follow up today he is seen with his wife. Gout has resolved. No further bleeding. Has abstained from Etoh and NSAIDs. He has lost 4 lbs. Wife notes his activity level is very poor. He struggles to walk from one side of the house to the other. He still does chair exercises daily- using a pedaling machine and lifting dumbbells but with walking he gets fatigued and SOB. No increased edema. Wife reports that once a week he feels so bad that he wished it was all over. Long longer wants to go outside. Last INR 1.9.   Current Outpatient Medications  Medication Sig Dispense Refill  . acetaminophen (TYLENOL) 500 MG tablet Take 500 mg by mouth daily before breakfast.    . allopurinol (ZYLOPRIM) 100 MG tablet TAKE ONE TABLET BY MOUTH ONCE DAILY 90 tablet 3  . aspirin EC 81 MG tablet Take 81 mg by mouth daily.    . bisacodyl (BISACODYL) 5 MG EC tablet Take 5 mg by mouth daily as needed for moderate constipation.    . Carboxymeth-Glycerin-Polysorb (REFRESH OPTIVE ADVANCED) 0.5-1-0.5 % SOLN Place 1 drop into both eyes See admin instructions. Instill one drop into both eyes twice daily two minutes before administering other eye drops (to reduce irritation)    . carvedilol (COREG) 6.25 MG tablet TAKE ONE TABLET BY MOUTH TWICE DAILY WITH MEALS 180 tablet 2  . colchicine 0.6 MG tablet Take 1 tablet (0.6 mg total) by mouth daily. (Patient taking differently: Take 0.6 mg by mouth daily as needed (gout flare). ) 30 tablet 0  .  cyanocobalamin (,VITAMIN B-12,) 1000 MCG/ML injection Inject 1,000 mcg into the muscle See admin instructions. Inject 1000 mcg intramuscularly every 60 days (next injection due 01/30/17)    . dorzolamide-timolol (COSOPT) 22.3-6.8 MG/ML ophthalmic solution Place 1 drop into both eyes 2 (two) times daily.     . ferrous sulfate 325 (65 FE) MG tablet TAKE 1 TABLET BY MOUTH ONCE DAILY WITH BREAKFAST (Patient taking differently: TAKE 1 TABLET (325 MG)  BY MOUTH ONCE DAILY WITH BREAKFAST) 30 tablet 9  . folic acid (FOLVITE) 1 MG tablet Take 1 mg by mouth daily.    . furosemide (LASIX) 20 MG tablet TAKE 1 TABLET BY MOUTH ONCE DAILY AS NEEDED FOR EDEMA AND SHORTNESS OF BREATH 90 tablet 3  . ketoconazole (NIZORAL) 2 % cream Apply 1 application topically 2 (two) times daily.    [name] latanoprost (XALATAN) 0.005 % ophthalmic solution Place 1 drop into both eyes at bedtime.    [name] levothyroxine (SYNTHROID, LEVOTHROID) 25 MCG tablet TAKE 1 TABLET BY MOUTH ONCE DAILY BEFORE BREAKFAST (Patient taking differently: TAKE 1 TABLET (25 MCG) BY MOUTH ONCE DAILY BEFORE BREAKFAST) 90 tablet 2  . losartan (COZAAR) 25 MG tablet TAKE ONE TABLET BY MOUTH ONCE DAILY (Patient taking differently: TAKE ONE TABLET (25 MG) BY MOUTH ONCE DAILY) 90 tablet 3  . Multiple Vitamin (MULTIVITAMIN WITH MINERALS) TABS tablet Take 1 tablet by mouth 2 (two) times daily.    [name] OVER THE COUNTER MEDICATION Place 1 spray into both nostrils daily. Over the counter nasal spray    . pantoprazole (PROTONIX) 40 MG tablet Take 1 tablet (40 mg total) by mouth daily. 60 tablet 2  . polyethylene glycol (MIRALAX / GLYCOLAX) packet Take 17 g by mouth daily. (Patient not taking: Reported on 01/29/2017) 14 each 4  . warfarin (COUMADIN) 5 MG tablet Take 1 and 1/2 tablet (7.5mg ) every Sunday and Thursday and 1 tablet (5mg ) all other days of the week. (Patient taking differently: Take 5-7.5 mg by mouth See admin instructions. Take 1 1/2 tablet (7.5 mg) by mouth on Sunday and Thursday night, take 1 tablet (5 mg) on all other nights of the week) 32 tablet 1   No current facility-administered medications for this visit.     No Known Allergies  Social History   Socioeconomic History  . Marital status: Married    Spouse name: Not on file  . Number of children: Not on file  . Years of education: Not on file  . Highest education level: Not on file  Social Needs  . Financial resource strain: Not on file  .  Food insecurity - worry: Not on file  . Food insecurity - inability: Not on file  . Transportation needs - medical: Not on file  . Transportation needs - non-medical: Not on file  Occupational History  . Not on file  Tobacco Use  . Smoking status: Former Research scientist (life sciences)  . Smokeless tobacco: Never Used  . Tobacco comment: pt states he smokes 3-4 cigars every day  Substance and Sexual Activity  . Alcohol use: Yes    Alcohol/week: 1.2 oz    Types: 2 Cans of beer per week  . Drug use: No  . Sexual activity: Not on file  Other Topics Concern  . Not on file  Social History Narrative  . Not on file     Review of Systems: As noted in HPI. All other systems reviewed and are otherwise negative except as noted above.    There were no  vitals taken for this visit.  GENERAL:  Elderly WM who appears chronically ill and depressed.  HEENT:  PERRL, EOMI, sclera are clear. Oropharynx is clear. Missing several upper teeth. NECK:  No jugular venous distention, carotid upstroke brisk and symmetric, no bruits, no thyromegaly or adenopathy LUNGS:  Clear to auscultation bilaterally CHEST:  Unremarkable HEART:  RRR,  PMI not displaced or sustained,S1 and S2 within normal limits, no S3, no S4: no clicks, no rubs, no murmurs ABD:  Soft, nontender. BS +, no masses or bruits. No hepatomegaly, no splenomegaly EXT:  2 + pulses throughout, no edema, no cyanosis no clubbing.  SKIN:  Warm and dry.  No rashes NEURO:  Alert and oriented x 3. Cranial nerves II through XII intact. PSYCH:  Cognitively intact    Lab Results  Component Value Date   WBC 5.6 01/28/2017   HGB 13.3 01/28/2017   HCT 39.0 01/28/2017   PLT 128 (L) 01/28/2017   GLUCOSE 96 01/28/2017   CHOL 147 07/31/2011   TRIG 64.0 07/31/2011   HDL 60.50 07/31/2011   LDLCALC 74 07/31/2011   ALT 19 09/07/2016   AST 26 09/07/2016   NA 136 01/28/2017   K 3.8 01/28/2017   CL 99 (L) 01/28/2017   CREATININE 0.90 01/28/2017   BUN 14 01/28/2017   CO2 22  09/07/2016   TSH 5.320 (H) 09/07/2016   INR 1.77 01/28/2017   Echo: 09/09/14:Study Conclusions  - HPI and indications: Afib with pauses noted during the study. - Left ventricle: The cavity size was normal. Wall thickness was increased in a pattern of mild LVH. Systolic function was normal. The estimated ejection fraction was in the range of 55% to 60%. The study is not technically sufficient to allow evaluation of LV diastolic function. - Aortic valve: Bioprosthetic AVR. No obstruction. Trivial AI. - Mitral valve: Calcified annulus. Mildly thickened leaflets . There was mild regurgitation. - Left atrium: Severely dilated at 62 ml/m2. - Right ventricle: The cavity size was mildly dilated. Systolic function is reduced. - Right atrium: Moderately dilated at 26 cm2. - Tricuspid valve: There was mild regurgitation. - Pulmonary arteries: PA peak pressure: 29 mm Hg (S). - Inferior vena cava: The vessel was normal in size. The respirophasic diameter changes were in the normal range (>= 50%), consistent with normal central venous pressure.  Impressions:  - Compared to a prior study in 2012, the LVEF is improved to 55-60%, however, there are pauses noted and the underlying rhythm is a-fib. There is no evidence for obstruction of the bioprosthetic AVR. There is severe LAE and moderate RAE.  ASSESSMENT AND PLAN:   1. Chronic diastolic heart failure: Appears to be euvolemic on exam. Weight is down and no edema or rales. Continue diuretic therapy with Lasix as prescribed along with daily weights and low-sodium diet.  2. Atrial fibrillation with a slow ventricular response:  s/p successful DCCV in September. Rhythm is regular today. Continue beta blocker and coumadin. INR adjustment per Dr. Rex Kras.   3. AS s/p tissue AVR in 9/12. Echo in 2016 showed good valve function.  4. CAD s/p CABG in 2000. Cath in 2012 showed patent grafts. No angina.  5. AAA s/p endograft with  persistent endoleak. Manage conservatively.  6. S/p left CEA. Carotid dopplers in October showed no significant stenosis.  7. Spinal stenosis. S/p surgery.   8. GI bleed secondary to gastritis and gastric erosions. Felt to be related to ASA and NSAID use with Etoh use as well. Continue  coumadin for now.  9. Gout. Resolved.  10. Dyspnea and fatigue on exertion. Will check CBC to make sure he is not more anemic. Also check chemistry panel and TSH.  11. Inguinal hernia.     Peter Martinique MD,FACC  03/25/2017 10:52 AM

## 2017-03-28 ENCOUNTER — Ambulatory Visit: Payer: Medicare HMO | Admitting: Cardiology

## 2017-07-02 ENCOUNTER — Other Ambulatory Visit: Payer: Self-pay | Admitting: Cardiology

## 2017-07-02 NOTE — Telephone Encounter (Signed)
Rx has been sent to the pharmacy electronically. ° °

## 2017-07-16 ENCOUNTER — Other Ambulatory Visit: Payer: Self-pay | Admitting: Cardiology

## 2017-07-17 NOTE — Telephone Encounter (Signed)
Rx sent to pharmacy   

## 2017-07-20 NOTE — Progress Notes (Signed)
This encounter was created in error - please disregard.

## 2017-08-25 ENCOUNTER — Emergency Department (HOSPITAL_COMMUNITY)
Admission: EM | Admit: 2017-08-25 | Discharge: 2017-08-25 | Disposition: A | Discharge status: Home / Self Care | Payer: Medicare HMO | Attending: Emergency Medicine | Admitting: Emergency Medicine

## 2017-08-25 ENCOUNTER — Other Ambulatory Visit: Payer: Self-pay

## 2017-08-25 ENCOUNTER — Encounter (HOSPITAL_COMMUNITY): Payer: Self-pay

## 2017-08-25 DIAGNOSIS — R040 Epistaxis: Secondary | ICD-10-CM | POA: Diagnosis not present

## 2017-08-25 DIAGNOSIS — Z7901 Long term (current) use of anticoagulants: Secondary | ICD-10-CM | POA: Diagnosis not present

## 2017-08-25 DIAGNOSIS — Z87891 Personal history of nicotine dependence: Secondary | ICD-10-CM | POA: Insufficient documentation

## 2017-08-25 DIAGNOSIS — E039 Hypothyroidism, unspecified: Secondary | ICD-10-CM | POA: Insufficient documentation

## 2017-08-25 DIAGNOSIS — I5022 Chronic systolic (congestive) heart failure: Secondary | ICD-10-CM | POA: Diagnosis not present

## 2017-08-25 DIAGNOSIS — N182 Chronic kidney disease, stage 2 (mild): Secondary | ICD-10-CM | POA: Insufficient documentation

## 2017-08-25 DIAGNOSIS — Z96651 Presence of right artificial knee joint: Secondary | ICD-10-CM | POA: Diagnosis not present

## 2017-08-25 DIAGNOSIS — Z952 Presence of prosthetic heart valve: Secondary | ICD-10-CM | POA: Insufficient documentation

## 2017-08-25 DIAGNOSIS — I2510 Atherosclerotic heart disease of native coronary artery without angina pectoris: Secondary | ICD-10-CM | POA: Diagnosis not present

## 2017-08-25 DIAGNOSIS — Z79899 Other long term (current) drug therapy: Secondary | ICD-10-CM | POA: Insufficient documentation

## 2017-08-25 DIAGNOSIS — Z7982 Long term (current) use of aspirin: Secondary | ICD-10-CM | POA: Insufficient documentation

## 2017-08-25 DIAGNOSIS — Z8673 Personal history of transient ischemic attack (TIA), and cerebral infarction without residual deficits: Secondary | ICD-10-CM | POA: Insufficient documentation

## 2017-08-25 DIAGNOSIS — Z951 Presence of aortocoronary bypass graft: Secondary | ICD-10-CM | POA: Diagnosis not present

## 2017-08-25 DIAGNOSIS — I130 Hypertensive heart and chronic kidney disease with heart failure and stage 1 through stage 4 chronic kidney disease, or unspecified chronic kidney disease: Secondary | ICD-10-CM | POA: Insufficient documentation

## 2017-08-25 LAB — CBC WITH DIFFERENTIAL/PLATELET
Abs Immature Granulocytes: 0 10*3/uL (ref 0.0–0.1)
Basophils Absolute: 0 10*3/uL (ref 0.0–0.1)
Basophils Relative: 1 %
Eosinophils Absolute: 0.1 10*3/uL (ref 0.0–0.7)
Eosinophils Relative: 4 %
HCT: 33.3 % — ABNORMAL LOW (ref 39.0–52.0)
Hemoglobin: 11.3 g/dL — ABNORMAL LOW (ref 13.0–17.0)
Immature Granulocytes: 0 %
Lymphocytes Relative: 20 %
Lymphs Abs: 0.7 10*3/uL (ref 0.7–4.0)
MCH: 35.8 pg — ABNORMAL HIGH (ref 26.0–34.0)
MCHC: 33.9 g/dL (ref 30.0–36.0)
MCV: 105.4 fL — ABNORMAL HIGH (ref 78.0–100.0)
Monocytes Absolute: 0.3 10*3/uL (ref 0.1–1.0)
Monocytes Relative: 9 %
Neutro Abs: 2.4 10*3/uL (ref 1.7–7.7)
Neutrophils Relative %: 66 %
Platelets: 118 10*3/uL — ABNORMAL LOW (ref 150–400)
RBC: 3.16 MIL/uL — ABNORMAL LOW (ref 4.22–5.81)
RDW: 15 % (ref 11.5–15.5)
WBC: 3.6 10*3/uL — ABNORMAL LOW (ref 4.0–10.5)

## 2017-08-25 LAB — BASIC METABOLIC PANEL
Anion gap: 10 (ref 5–15)
BUN: 16 mg/dL (ref 8–23)
CO2: 25 mmol/L (ref 22–32)
Calcium: 10.2 mg/dL (ref 8.9–10.3)
Chloride: 100 mmol/L (ref 98–111)
Creatinine, Ser: 1.06 mg/dL (ref 0.61–1.24)
GFR calc Af Amer: >60 mL/min (ref >60)
GFR calc non Af Amer: >60 mL/min (ref >60)
Glucose, Bld: 94 mg/dL (ref 70–99)
Potassium: 4.8 mmol/L (ref 3.5–5.1)
Sodium: 135 mmol/L (ref 135–145)

## 2017-08-25 LAB — PROTIME-INR
INR: 2.3
Prothrombin Time: 25.1 seconds — ABNORMAL HIGH (ref 11.4–15.2)

## 2017-08-25 MED ORDER — OXYMETAZOLINE HCL 0.05 % NA SOLN
1.0000 | Freq: Once | NASAL | Status: AC
  Administered 2017-08-25: 1 via NASAL
  Filled 2017-08-25: qty 15

## 2017-08-25 NOTE — ED Provider Notes (Signed)
[facility] EMERGENCY DEPARTMENT Provider Note   CSN: [id number] Arrival date & time: 08/25/17  1238     History   Chief Complaint Chief Complaint  Patient presents with  . Epistaxis    HPI [name] is a 84 y.o. male with history of AAA, aortic stenosis, A. fib, CHF, emphysema, CAD, HLD, HTN, stroke, and paroxysmal atrial tachycardia presents for evaluation of acute onset, intermittent, persistent nosebleeds for 3 weeks.  Patient's wife states that he was placed on antibiotic for treatment of a tick bite (presumably prophylactic for Lyme's disease).  She states that several days into the course of the antibiotic he began developing nosebleeds which were generally well controlled and resolved with a frozen water bottle to the back of the neck.  They have not been applying any direct pressure to the nose.  He was seen and evaluated by his PCP on Thursday 3 days ago and was found to have a supratherapeutic INR of around 3.5 and hemoglobin of 11.  Patient was instructed to discontinue his warfarin until recheck on Monday in 2 days.  He states that he had a nosebleed last night from around 6:30 PM to 9:30 PM which eventually stopped.  He awoke this morning without a nosebleed but at around 11:30 AM spontaneously developed a right-sided nosebleed while sitting in his chair.  EMS was called and the nosebleed improved with Afrin.  He denies lightheadedness.  Denies chest pain, lightheadedness, syncope, nausea, or vomiting.  The history is provided by the patient.    Past Medical History:  Diagnosis Date  . AAA (abdominal aortic aneurysm) ([place])    s/p stent graft in September 2009 & with attempted embolization/occlusion of vessels for a type 2 leak around the stent graft; Managed by Dr. Oneida Alar; last CT in 2012 showing the leak had closed.   . Anemia    on iron pills  . Aortic stenosis    s/p AVR in September 2012 per Dr. Cyndia Bent  . Arthritis   . Atrial fibrillation  [facility])    s/p cardioversion October 2012  . CHF (congestive heart failure) ([place])    SEPT AND OCT 2012; follow up echo in October shows EF of 50%.   . Chronic anticoagulation   . Coronary artery disease    Remote CABG x 5 in 2000  . Emphysema of lung ([place])   . Gallstones    s/p cholecystectomy in Dec 2012  . GI bleeding 01/03/2016  . Gout   . High risk medication use    on amiodarone  . History of carotid stenosis    s/p L CEA in September 2006  . Hyperlipidemia   . Hypertension   . Inguinal hernia    RIH  . PAT (paroxysmal atrial tachycardia) ([place])   . PVC's (premature ventricular contractions)   . Stroke [facility]) 2006   three strokes -no residuals  . Urinary frequency    at night  . Wears glasses     Patient Active Problem List   Diagnosis Date Noted  . Gout attack 01/10/2016  . Constipation 01/10/2016  . Thrombocytopenia ([place]) 01/10/2016  . Hx of CABG   . History of completed stroke   . Status post abdominal aortic aneurysm (AAA) repair   . CKD stage G2/A2, GFR 60-89 and albumin creatinine ratio 30-299 mg/g   . Gastrointestinal hemorrhage 01/03/2016  . Chronic systolic heart failure ([place]) 11/10/2014  . Chronic anticoagulation 09/14/2014  . Acute on chronic systolic CHF (congestive  heart failure) ([place]) 09/09/2014  . Essential hypertension   . Shortness of breath   . Hypertensive urgency 09/08/2014  . Epistaxis 01/15/2014  . AAA (abdominal aortic aneurysm) ([place]) 05/22/2012  . Occlusion and stenosis of carotid artery without mention of cerebral infarction 12/14/2011  . S/P AVR (aortic valve replacement) 07/31/2011  . Hypothyroidism 04/28/2011  . Abdominal pain 12/26/2010  . Dilated cardiomyopathy ([place]) 11/16/2010  . Coronary artery disease involving native coronary artery of native heart without angina pectoris   . HTN (hypertension), benign   . Hyperlipidemia   . Aortic valve disorders 11/08/2010  . Chronic atrial fibrillation ([place]) 10/31/2010  . On warfarin  therapy 10/31/2010  . Inguinal hernia unilateral, non-recurrent 10/03/2010  . ATHEROSCLEROTIC CARDIOVASCULAR DISEASE 10/01/2008  . NONSPECIFIC ABN FINDING RAD & OTH EXAM GI TRACT 10/01/2008    Past Surgical History:  Procedure Laterality Date  . ABDOMINAL AORTAGRAM N/A 12/25/2011   Procedure: ABDOMINAL Maxcine Ham;  Surgeon: Elam Dutch, MD;  Location: [facility] CATH LAB;  Service: Cardiovascular;  Laterality: N/A;  . ABDOMINAL AORTIC ANEURYSM REPAIR  2008   Gore Excluder Stent Graft repair  . ABDOMINAL AORTIC ANEURYSM REPAIR  2010/2012  . AORTIC VALVE REPLACEMENT  10/24/10   AVR  #25 mm Magna Ease pericardial valve  . CARDIAC CATHETERIZATION  04/29/1998   EF 60%  . CARDIOVASCULAR STRESS TEST  08/19/2009  . CARDIOVERSION N/A 11/11/2014   Procedure: CARDIOVERSION;  Surgeon: [name], MD;  Location: [facility];  Service: Cardiovascular;  Laterality: N/A;  . CAROTID ENDARTERECTOMY  2006   Left Side  . CHOLECYSTECTOMY  01/23/2011   Procedure: LAPAROSCOPIC CHOLECYSTECTOMY WITH INTRAOPERATIVE CHOLANGIOGRAM;  Surgeon: Rolm Bookbinder, MD;  Location: [facility] ORS;  Service: General;  Laterality: N/A;  . COLON SURGERY     lap right colon  . CORONARY ARTERY BYPASS GRAFT  1970   bypass and open heart surgery--pt states this is incorrect--his only cabg was in 2000  . CORONARY ARTERY BYPASS GRAFT  2000   5 vessel BY DR.BARTEL. [place] GRAFT TO THE LAD, SEQUENTIAL SAPHENOUS VEIN GRAFT TO THE  FIRST DIAGONAL AND FIRST OBTUSE MARGINAL VESSELS, SAPHENOUS VEIN GRAFT TO THE SECOND OBTUSE MARGINAL VESSEL, AND SAPHENOUS VEIN GRAFT TO THE PDA  . ESOPHAGOGASTRODUODENOSCOPY N/A 01/05/2016   Procedure: ESOPHAGOGASTRODUODENOSCOPY (EGD);  Surgeon: Otis Brace, MD;  Location: [facility];  Service: Gastroenterology;  Laterality: N/A;  . EYE SURGERY     bilateral cataract extractions  . HEMORRHOID SURGERY    . HEMORRHOID SURGERY    . HERNIA REPAIR  10/13/10   RIH  . JOINT REPLACEMENT     Right TKR  .  REPLACEMENT TOTAL KNEE  2008  . TONSILLECTOMY    . US ECHOCARDIOGRAPHY  08/18/2009   EF 55-60%  . US ECHOCARDIOGRAPHY  04/22/2007   EF 55-60%        Home Medications    Prior to Admission medications   Medication Sig Start Date End Date Taking? Authorizing Provider  acetaminophen (TYLENOL) 500 MG tablet Take 500 mg by mouth daily before breakfast.   Yes [provider]  allopurinol (ZYLOPRIM) 100 MG tablet TAKE ONE TABLET BY MOUTH ONCE DAILY 02/12/17  Yes [place], Peter M, MD  aspirin EC 81 MG tablet Take 81 mg by mouth daily.   Yes [provider]  bisacodyl (BISACODYL) 5 MG EC tablet Take 5 mg by mouth daily as needed for moderate constipation.   Yes [provider]  brimonidine-timolol (COMBIGAN) 0.2-0.5 % ophthalmic solution Place 1  drop into both eyes every 12 (twelve) hours.   Yes [provider]  Carboxymeth-Glycerin-Polysorb (REFRESH OPTIVE ADVANCED) 0.5-1-0.5 % SOLN Place 1 drop into both eyes See admin instructions. Instill one drop into both eyes twice daily two minutes before administering other eye drops (to reduce irritation)   Yes [provider]  carvedilol (COREG) 6.25 MG tablet TAKE ONE TABLET BY MOUTH TWICE DAILY WITH MEALS 02/21/17  Yes [place], Peter M, MD  colchicine 0.6 MG tablet Take 1 tablet (0.6 mg total) by mouth daily. Patient taking differently: Take 0.6 mg by mouth daily as needed (gout flare).  01/11/16  Yes [name], MD  cyanocobalamin (,VITAMIN B-12,) 1000 MCG/ML injection Inject 1,000 mcg into the muscle See admin instructions. Inject 1000 mcg intramuscularly every 60 days (next injection due 01/30/17)   Yes [provider]  ferrous sulfate 325 (65 FE) MG tablet TAKE 1 TABLET BY MOUTH ONCE DAILY WITH BREAKFAST Patient taking differently: TAKE 1 TABLET (325 MG) BY MOUTH ONCE DAILY WITH BREAKFAST 12/04/16  Yes [place], Peter M, MD  folic acid (FOLVITE) 1 MG tablet Take 1 mg by mouth daily.   Yes [provider]  furosemide (LASIX) 20 MG tablet TAKE 1 TABLET BY MOUTH ONCE DAILY AS NEEDED FOR EDEMA AND SHORTNESS OF BREATH 03/15/17  Yes [place], Peter M, MD  latanoprost (XALATAN) 0.005 % ophthalmic solution Place 1 drop into both eyes at bedtime.   Yes [provider]  levothyroxine (SYNTHROID, LEVOTHROID) 25 MCG tablet Take 1 tablet (25 mcg total) by mouth daily before breakfast. Keep OV 07/17/17  Yes [place], Peter M, MD  losartan (COZAAR) 25 MG tablet TAKE 1 TABLET BY MOUTH ONCE DAILY 07/02/17  Yes [place], Peter M, MD  Multiple Vitamin (MULTIVITAMIN WITH MINERALS) TABS tablet Take 1 tablet by mouth 2 (two) times daily.   Yes [provider]  oxymetazoline (AFRIN) 0.05 % nasal spray Place 1 spray into both nostrils 2 (two) times daily as needed (nose bleeds).   Yes [provider]  pantoprazole (PROTONIX) 40 MG tablet Take 1 tablet (40 mg total) by mouth daily. 01/11/16  Yes [name], MD  polyethylene glycol [facility] Floria Raveling) packet Take 17 g by mouth daily. 01/11/16  Yes [name], MD  warfarin (COUMADIN) 5 MG tablet Take 1 and 1/2 tablet (7.5mg ) every Sunday and Thursday and 1 tablet (5mg ) all other days of the week. Patient taking differently: Take 5-7.5 mg by mouth See admin instructions. Take 1 1/2 tablet (7.5 mg) by mouth on Sunday and Thursday night, take 1 tablet (5 mg) on all other nights of the week 08/18/16   Martinique, Peter M, MD    Family History Family History  Problem Relation Age of Onset  . Other Mother        falopian tube during pregnancy   . Heart disease Father   . Cancer Brother        liver     Social History Social History   Tobacco Use  . Smoking status: Former Research scientist (life sciences)  . Smokeless tobacco: Never Used  . Tobacco comment: pt states he smokes 3-4 cigars every day  Substance Use Topics  . Alcohol use: Yes    Alcohol/week: 1.2 oz    Types: 2 Cans of beer per week  . Drug use: No     Allergies   Doxycycline   Review of  Systems Review of Systems  Constitutional: Positive for chills and fever.  HENT: Positive for nosebleeds.  Cardiovascular: Negative for chest pain.  Gastrointestinal: Negative for abdominal pain, nausea and vomiting.  All other systems reviewed and are negative.    Physical Exam Updated Vital Signs BP (!) 158/87   Pulse 93   Temp 97.7 F (36.5 C) (Oral)   Resp 18   Ht 5\' 11"  (1.803 m)   Wt 75.3 kg (166 lb)   SpO2 99%   BMI 23.15 kg/m   Physical Exam  Constitutional: He appears well-developed and well-nourished. No distress.  HENT:  Head: Normocephalic and atraumatic.  Nasal septum is midline, slow oozing from a superficial bleed along the anterior right septum within Keisselbach's plexus  Eyes: Pupils are equal, round, and reactive to light. Conjunctivae and EOM are normal. Right eye exhibits no discharge. Left eye exhibits no discharge.  Neck: Normal range of motion. Neck supple. No JVD present. No tracheal deviation present.  Cardiovascular:  Irregularly irregular rhythm  Pulmonary/Chest: Effort normal.  Equal rise and fall of chest, no increased work of breathing.  Speaking in full sentences  Abdominal: He exhibits no distension.  Musculoskeletal: He exhibits no edema.  Neurological: He is alert.  Skin: Skin is warm and dry. No erythema.  Psychiatric: He has a normal mood and affect. His behavior is normal.  Nursing note and vitals reviewed.    ED Treatments / Results  Labs (all labs ordered are listed, but only abnormal results are displayed) Labs Reviewed  CBC WITH DIFFERENTIAL/PLATELET - Abnormal; Notable for the following components:      Result Value   WBC 3.6 (*)    RBC 3.16 (*)    Hemoglobin 11.3 (*)    HCT 33.3 (*)    MCV 105.4 (*)    MCH 35.8 (*)    Platelets 118 (*)    All other components within normal limits  PROTIME-INR - Abnormal; Notable for the following components:   Prothrombin Time 25.1 (*)    All other components within normal limits    BASIC METABOLIC PANEL    EKG None  Radiology No results found.  Procedures .Epistaxis Management Date/Time: 08/25/2017 4:58 PM Performed by: Renita Papa, PA-C Authorized by: Renita Papa, PA-C   Consent:    Consent obtained:  Verbal   Consent given by:  Patient   Risks discussed:  Bleeding, nasal injury, pain and infection   Alternatives discussed:  No treatment Anesthesia (see MAR for exact dosages):    Anesthesia method:  None Procedure details:    Treatment site:  R septum   Treatment method:  Silver nitrate   Treatment complexity:  Limited   Treatment episode: initial   Post-procedure details:    Assessment:  Bleeding stopped   Patient tolerance of procedure:  Tolerated well, no immediate complications   (including critical care time)  Medications Ordered in ED Medications  oxymetazoline (AFRIN) 0.05 % nasal spray 1 spray (1 spray Right Nare Given 08/25/17 1406)     Initial Impression / Assessment and Plan / ED Course  I have reviewed the triage vital signs and the nursing notes.  Pertinent labs & imaging results that were available during my care of the patient were reviewed by me and considered in my medical decision making (see chart for details).     Patient with right-sided epistaxis.  He is afebrile, initially hypertensive with improvement on reevaluation.  He did not take any of his blood pressure medicines this morning.  He is nontoxic in appearance.  Lab work shows mild anemia with hemoglobin  11.3.  Patient's wife states that lab work completed by his PCP a few days ago showed similar hemoglobin.  INR today 2.3, improved from 3.5 with his PCP a few days ago.  He has a small amount of blood oozing from the right anterior septum.  Hemostasis was initially achieved with direct pressure for 10 minutes  but the patient coughed and caused a rebleed.  Silver nitrate was used to cauterize the bleed successfully.  The patient tolerated the procedure without  difficulty.  With stable lab work and hemostasis achieved, he is stable for discharge home with follow-up with his PCP in 2 days who will do an INR check.  Discussed the importance of hydration with nasal saline spray and instructions to not disrupt the clot.  Discussed strict ED return precautions.  Patient and patient's wife verbalized understanding of and agreement with plan and patient is stable for discharge home at this time.  Patient was seen and evaluated by Dr. Jeanell Sparrow who agrees with assessment and plan at this time. Final Clinical Impressions(s) / ED Diagnoses   Final diagnoses:  Right-sided epistaxis    ED Discharge Orders    None       Renita Papa, PA-C 08/25/17 1707    Pattricia Boss, MD 08/26/17 [phone]

## 2017-08-25 NOTE — ED Triage Notes (Addendum)
Pt brought in by EMS for c/o Nose bleed that began last night that lasted x 3 hours ; nose began to bleed again this morning around 9 am and lasted for another 3 hours; pt took afrin piror to EMS arrival ; Pt has never had nose bleeds , denies any trauma to the nose ; pt was on warin until a couple days ago ; no active bleeding at this time ; possible dementia per family ; pt Alert and oriented x4

## 2017-08-25 NOTE — Discharge Instructions (Signed)
Keep the nose hydrated with nasal saline spray as needed.  Do not disrupt the nose with touching, coughing, sneezing if you can help it.  If the nose starts to bleed again, sit upright and apply direct pressure to the tip of your nose where it is bleeding for 20 minutes straight.  Do not stop applying pressure for the entire 20 minutes.  If you are still bleeding after this, you can use 1 spray of Afrin in the nose and then apply pressure again for 20 minutes.  If this still does not work, you can come to the emergency department.  You can also come to the emergency department if you have any other worsening signs or symptoms such as fever, shortness of breath, chest pains, or lightheadedness.  Follow-up with your primary care physician as scheduled in 2 days for reevaluation of your symptoms and for repeat INR.

## 2017-09-25 ENCOUNTER — Encounter: Payer: Self-pay | Admitting: Hematology & Oncology

## 2017-09-26 ENCOUNTER — Ambulatory Visit: Payer: Medicare HMO | Admitting: Physician Assistant

## 2017-10-04 ENCOUNTER — Emergency Department (HOSPITAL_COMMUNITY)
Admission: EM | Admit: 2017-10-04 | Discharge: 2017-10-04 | Disposition: A | Discharge status: Home / Self Care | Payer: Medicare HMO | Attending: Emergency Medicine | Admitting: Emergency Medicine

## 2017-10-04 ENCOUNTER — Encounter (HOSPITAL_COMMUNITY): Payer: Self-pay | Admitting: *Deleted

## 2017-10-04 ENCOUNTER — Other Ambulatory Visit: Payer: Self-pay

## 2017-10-04 ENCOUNTER — Emergency Department (HOSPITAL_COMMUNITY): Payer: Medicare HMO

## 2017-10-04 DIAGNOSIS — E860 Dehydration: Secondary | ICD-10-CM | POA: Diagnosis not present

## 2017-10-04 DIAGNOSIS — N189 Chronic kidney disease, unspecified: Secondary | ICD-10-CM | POA: Diagnosis not present

## 2017-10-04 DIAGNOSIS — R1084 Generalized abdominal pain: Secondary | ICD-10-CM

## 2017-10-04 DIAGNOSIS — E039 Hypothyroidism, unspecified: Secondary | ICD-10-CM | POA: Insufficient documentation

## 2017-10-04 DIAGNOSIS — I130 Hypertensive heart and chronic kidney disease with heart failure and stage 1 through stage 4 chronic kidney disease, or unspecified chronic kidney disease: Secondary | ICD-10-CM | POA: Insufficient documentation

## 2017-10-04 DIAGNOSIS — Z79899 Other long term (current) drug therapy: Secondary | ICD-10-CM | POA: Diagnosis not present

## 2017-10-04 DIAGNOSIS — I5022 Chronic systolic (congestive) heart failure: Secondary | ICD-10-CM | POA: Diagnosis not present

## 2017-10-04 DIAGNOSIS — Z7982 Long term (current) use of aspirin: Secondary | ICD-10-CM | POA: Diagnosis not present

## 2017-10-04 DIAGNOSIS — Z87891 Personal history of nicotine dependence: Secondary | ICD-10-CM | POA: Diagnosis not present

## 2017-10-04 DIAGNOSIS — K859 Acute pancreatitis without necrosis or infection, unspecified: Secondary | ICD-10-CM | POA: Diagnosis not present

## 2017-10-04 DIAGNOSIS — N179 Acute kidney failure, unspecified: Secondary | ICD-10-CM

## 2017-10-04 LAB — URINALYSIS, ROUTINE W REFLEX MICROSCOPIC
Bilirubin Urine: NEGATIVE
GLUCOSE, UA: NEGATIVE mg/dL
HGB URINE DIPSTICK: NEGATIVE
Ketones, ur: NEGATIVE mg/dL
Leukocytes, UA: NEGATIVE
Nitrite: NEGATIVE
PH: 5 (ref 5.0–8.0)
Protein, ur: NEGATIVE mg/dL
SPECIFIC GRAVITY, URINE: 1.015 (ref 1.005–1.030)

## 2017-10-04 LAB — COMPREHENSIVE METABOLIC PANEL
ALT: 18 U/L (ref 0–44)
ANION GAP: 7 (ref 5–15)
AST: 25 U/L (ref 15–41)
Albumin: 3.9 g/dL (ref 3.5–5.0)
Alkaline Phosphatase: 75 U/L (ref 38–126)
BUN: 29 mg/dL — ABNORMAL HIGH (ref 8–23)
CHLORIDE: 103 mmol/L (ref 98–111)
CO2: 27 mmol/L (ref 22–32)
CREATININE: 1.62 mg/dL — AB (ref 0.61–1.24)
Calcium: 10.1 mg/dL (ref 8.9–10.3)
GFR calc non Af Amer: 36 mL/min — ABNORMAL LOW (ref >60)
GFR, EST AFRICAN AMERICAN: 42 mL/min — AB (ref >60)
Glucose, Bld: 83 mg/dL (ref 70–99)
Potassium: 4.1 mmol/L (ref 3.5–5.1)
Sodium: 137 mmol/L (ref 135–145)
Total Bilirubin: 0.9 mg/dL (ref 0.3–1.2)
Total Protein: 6.1 g/dL — ABNORMAL LOW (ref 6.5–8.1)

## 2017-10-04 LAB — CBC
HCT: 30.6 % — ABNORMAL LOW (ref 39.0–52.0)
HEMOGLOBIN: 10.2 g/dL — AB (ref 13.0–17.0)
MCH: 35.9 pg — AB (ref 26.0–34.0)
MCHC: 33.3 g/dL (ref 30.0–36.0)
MCV: 107.7 fL — AB (ref 78.0–100.0)
Platelets: 112 10*3/uL — ABNORMAL LOW (ref 150–400)
RBC: 2.84 MIL/uL — AB (ref 4.22–5.81)
RDW: 14.5 % (ref 11.5–15.5)
WBC: 3.4 10*3/uL — ABNORMAL LOW (ref 4.0–10.5)

## 2017-10-04 LAB — PROTIME-INR
INR: 1.05
Prothrombin Time: 13.6 seconds (ref 11.4–15.2)

## 2017-10-04 LAB — I-STAT CG4 LACTIC ACID, ED
LACTIC ACID, VENOUS: 1.16 mmol/L (ref 0.5–1.9)
Lactic Acid, Venous: 0.74 mmol/L (ref 0.5–1.9)

## 2017-10-04 LAB — I-STAT TROPONIN, ED
TROPONIN I, POC: 0.02 ng/mL (ref 0.00–0.08)
Troponin i, poc: 0.01 ng/mL (ref 0.00–0.08)

## 2017-10-04 LAB — LIPASE, BLOOD: LIPASE: 95 U/L — AB (ref 11–51)

## 2017-10-04 MED ORDER — LOSARTAN POTASSIUM 25 MG PO TABS: 25.0000 mg | ORAL_TABLET | Freq: Once | ORAL | Status: DC

## 2017-10-04 MED ORDER — SODIUM CHLORIDE 0.9 % IV BOLUS
500.0000 mL | Freq: Once | INTRAVENOUS | Status: AC
  Administered 2017-10-04: 500 mL via INTRAVENOUS

## 2017-10-04 MED ORDER — HYDRALAZINE HCL 10 MG PO TABS
10.0000 mg | ORAL_TABLET | Freq: Once | ORAL | Status: AC
  Administered 2017-10-04: 10 mg via ORAL
  Filled 2017-10-04: qty 1

## 2017-10-04 MED ORDER — IOPAMIDOL (ISOVUE-370) INJECTION 76%
INTRAVENOUS | Status: AC
  Administered 2017-10-04: 80 mL
  Filled 2017-10-04: qty 100

## 2017-10-04 MED ORDER — AMLODIPINE BESYLATE 5 MG PO TABS
5.0000 mg | ORAL_TABLET | Freq: Once | ORAL | Status: AC
  Administered 2017-10-04: 5 mg via ORAL
  Filled 2017-10-04: qty 1

## 2017-10-04 MED ORDER — LOSARTAN POTASSIUM 25 MG PO TABS
25.0000 mg | ORAL_TABLET | Freq: Once | ORAL | Status: DC
  Filled 2017-10-04: qty 1

## 2017-10-04 NOTE — Discharge Instructions (Signed)
Please follow-up with your cardiologist and your PCP in the next 24 to 48 hours.  Please maintain hydration.  Please continue taking your home blood pressure medicines to manage her blood pressure.  Your CT scan did not show significant growth of your aortic aneurysm.  We did find your kidney function was worsened today and you likely have a mild pancreatitis.  If any symptoms change or worsen, please return to the nearest emergency department.

## 2017-10-04 NOTE — ED Notes (Signed)
Pt and wife state they undestand instructions and home with information on medication given today. DC stable via wc.

## 2017-10-04 NOTE — ED Notes (Signed)
Unable to get a temp orally after multiple attempts

## 2017-10-04 NOTE — ED Notes (Signed)
pts pulse is irfregular  Hx af

## 2017-10-04 NOTE — ED Triage Notes (Signed)
The pt arrived by [facility] from home with abd pain for awhile  Worse for the past 2 hours

## 2017-10-04 NOTE — ED Notes (Signed)
Pt given saturkey tray and tolerating po fluids well.

## 2017-10-04 NOTE — ED Notes (Signed)
ED Provider at bedside. 

## 2017-10-04 NOTE — ED Provider Notes (Signed)
[facility] EMERGENCY DEPARTMENT Provider Note   CSN: [id number] Arrival date & time: 10/04/17  0315     History   Chief Complaint Chief Complaint  Patient presents with  . Abdominal Pain    HPI [name] is a 84 y.o. male.  The history is provided by the patient, the spouse and medical records.  Abdominal Pain   This is a new problem. The current episode started 6 to 12 hours ago. The problem occurs constantly. The problem has been gradually improving. The pain is associated with an unknown factor. The pain is located in the generalized abdominal region. The quality of the pain is dull and aching. The pain is at a severity of 8/10. The pain is severe. Associated symptoms include frequency. Pertinent negatives include fever, diarrhea, nausea, vomiting, constipation, dysuria and headaches. Nothing aggravates the symptoms. Nothing relieves the symptoms.    Past Medical History:  Diagnosis Date  . AAA (abdominal aortic aneurysm) ([place])    s/p stent graft in September 2009 & with attempted embolization/occlusion of vessels for a type 2 leak around the stent graft; Managed by Dr. Oneida Alar; last CT in 2012 showing the leak had closed.   . Anemia    on iron pills  . Aortic stenosis    s/p AVR in September 2012 per Dr. Cyndia Bent  . Arthritis   . Atrial fibrillation [facility])    s/p cardioversion October 2012  . CHF (congestive heart failure) ([place])    SEPT AND OCT 2012; follow up echo in October shows EF of 50%.   . Chronic anticoagulation   . Coronary artery disease    Remote CABG x 5 in 2000  . Emphysema of lung ([place])   . Gallstones    s/p cholecystectomy in Dec 2012  . GI bleeding 01/03/2016  . Gout   . High risk medication use    on amiodarone  . History of carotid stenosis    s/p L CEA in September 2006  . Hyperlipidemia   . Hypertension   . Inguinal hernia    RIH  . PAT (paroxysmal atrial tachycardia) ([place])   . PVC's (premature ventricular  contractions)   . Stroke [facility]) 2006   three strokes -no residuals  . Urinary frequency    at night  . Wears glasses     Patient Active Problem List   Diagnosis Date Noted  . Gout attack 01/10/2016  . Constipation 01/10/2016  . Thrombocytopenia ([place]) 01/10/2016  . Hx of CABG   . History of completed stroke   . Status post abdominal aortic aneurysm (AAA) repair   . CKD stage G2/A2, GFR 60-89 and albumin creatinine ratio 30-299 mg/g   . Gastrointestinal hemorrhage 01/03/2016  . Chronic systolic heart failure ([place]) 11/10/2014  . Chronic anticoagulation 09/14/2014  . Acute on chronic systolic CHF (congestive heart failure) ([place]) 09/09/2014  . Essential hypertension   . Shortness of breath   . Hypertensive urgency 09/08/2014  . Epistaxis 01/15/2014  . AAA (abdominal aortic aneurysm) ([place]) 05/22/2012  . Occlusion and stenosis of carotid artery without mention of cerebral infarction 12/14/2011  . S/P AVR (aortic valve replacement) 07/31/2011  . Hypothyroidism 04/28/2011  . Abdominal pain 12/26/2010  . Dilated cardiomyopathy ([place]) 11/16/2010  . Coronary artery disease involving native coronary artery of native heart without angina pectoris   . HTN (hypertension), benign   . Hyperlipidemia   . Aortic valve disorders 11/08/2010  . Chronic atrial fibrillation ([place]) 10/31/2010  . On  warfarin therapy 10/31/2010  . Inguinal hernia unilateral, non-recurrent 10/03/2010  . ATHEROSCLEROTIC CARDIOVASCULAR DISEASE 10/01/2008  . NONSPECIFIC ABN FINDING RAD & OTH EXAM GI TRACT 10/01/2008    Past Surgical History:  Procedure Laterality Date  . ABDOMINAL AORTAGRAM N/A 12/25/2011   Procedure: ABDOMINAL Maxcine Ham;  Surgeon: Elam Dutch, MD;  Location: [facility] CATH LAB;  Service: Cardiovascular;  Laterality: N/A;  . ABDOMINAL AORTIC ANEURYSM REPAIR  2008   Gore Excluder Stent Graft repair  . ABDOMINAL AORTIC ANEURYSM REPAIR  2010/2012  . AORTIC VALVE REPLACEMENT  10/24/10   AVR  #25 mm Magna  Ease pericardial valve  . CARDIAC CATHETERIZATION  04/29/1998   EF 60%  . CARDIOVASCULAR STRESS TEST  08/19/2009  . CARDIOVERSION N/A 11/11/2014   Procedure: CARDIOVERSION;  Surgeon: [name], MD;  Location: [facility];  Service: Cardiovascular;  Laterality: N/A;  . CAROTID ENDARTERECTOMY  2006   Left Side  . CHOLECYSTECTOMY  01/23/2011   Procedure: LAPAROSCOPIC CHOLECYSTECTOMY WITH INTRAOPERATIVE CHOLANGIOGRAM;  Surgeon: Rolm Bookbinder, MD;  Location: [facility] ORS;  Service: General;  Laterality: N/A;  . COLON SURGERY     lap right colon  . CORONARY ARTERY BYPASS GRAFT  1970   bypass and open heart surgery--pt states this is incorrect--his only cabg was in 2000  . CORONARY ARTERY BYPASS GRAFT  2000   5 vessel BY DR.BARTEL. [place] GRAFT TO THE LAD, SEQUENTIAL SAPHENOUS VEIN GRAFT TO THE  FIRST DIAGONAL AND FIRST OBTUSE MARGINAL VESSELS, SAPHENOUS VEIN GRAFT TO THE SECOND OBTUSE MARGINAL VESSEL, AND SAPHENOUS VEIN GRAFT TO THE PDA  . ESOPHAGOGASTRODUODENOSCOPY N/A 01/05/2016   Procedure: ESOPHAGOGASTRODUODENOSCOPY (EGD);  Surgeon: Otis Brace, MD;  Location: [facility];  Service: Gastroenterology;  Laterality: N/A;  . EYE SURGERY     bilateral cataract extractions  . HEMORRHOID SURGERY    . HEMORRHOID SURGERY    . HERNIA REPAIR  10/13/10   RIH  . JOINT REPLACEMENT     Right TKR  . REPLACEMENT TOTAL KNEE  2008  . TONSILLECTOMY    . US ECHOCARDIOGRAPHY  08/18/2009   EF 55-60%  . US ECHOCARDIOGRAPHY  04/22/2007   EF 55-60%        Home Medications    Prior to Admission medications   Medication Sig Start Date End Date Taking? Authorizing Provider  acetaminophen (TYLENOL) 500 MG tablet Take 500 mg by mouth daily before breakfast.    [provider]  allopurinol (ZYLOPRIM) 100 MG tablet TAKE ONE TABLET BY MOUTH ONCE DAILY 02/12/17   [place]ique, Peter M, MD  aspirin EC 81 MG tablet Take 81 mg by mouth daily.    [provider]  bisacodyl (BISACODYL) 5 MG EC tablet  Take 5 mg by mouth daily as needed for moderate constipation.    [provider]  brimonidine-timolol (COMBIGAN) 0.2-0.5 % ophthalmic solution Place 1 drop into both eyes every 12 (twelve) hours.    [provider]  Carboxymeth-Glycerin-Polysorb (REFRESH OPTIVE ADVANCED) 0.5-1-0.5 % SOLN Place 1 drop into both eyes See admin instructions. Instill one drop into both eyes twice daily two minutes before administering other eye drops (to reduce irritation)    [provider]  carvedilol (COREG) 6.25 MG tablet TAKE ONE TABLET BY MOUTH TWICE DAILY WITH MEALS 02/21/17   [place], Peter M, MD  colchicine 0.6 MG tablet Take 1 tablet (0.6 mg total) by mouth daily. Patient taking differently: Take 0.6 mg by mouth daily as needed (gout flare).  01/11/16   [name], MD  cyanocobalamin (,VITAMIN B-12,) 1000 MCG/ML injection Inject 1,000 mcg into the muscle See admin instructions. Inject 1000 mcg intramuscularly every 60 days (next injection due 01/30/17)    [provider]  ferrous sulfate 325 (65 FE) MG tablet TAKE 1 TABLET BY MOUTH ONCE DAILY WITH BREAKFAST Patient taking differently: TAKE 1 TABLET (325 MG) BY MOUTH ONCE DAILY WITH BREAKFAST 12/04/16   [place], Peter M, MD  folic acid (FOLVITE) 1 MG tablet Take 1 mg by mouth daily.    [provider]  furosemide (LASIX) 20 MG tablet TAKE 1 TABLET BY MOUTH ONCE DAILY AS NEEDED FOR EDEMA AND SHORTNESS OF BREATH 03/15/17   [place], Peter M, MD  latanoprost (XALATAN) 0.005 % ophthalmic solution Place 1 drop into both eyes at bedtime.    [provider]  levothyroxine (SYNTHROID, LEVOTHROID) 25 MCG tablet Take 1 tablet (25 mcg total) by mouth daily before breakfast. Keep OV 07/17/17   [place], Peter M, MD  losartan (COZAAR) 25 MG tablet TAKE 1 TABLET BY MOUTH ONCE DAILY 07/02/17   [place], Peter M, MD  Multiple Vitamin (MULTIVITAMIN WITH MINERALS) TABS tablet Take 1 tablet by mouth 2 (two) times daily.    [provider]  oxymetazoline (AFRIN) 0.05 % nasal spray Place 1 spray into both nostrils 2 (two) times daily as needed (nose bleeds).    [provider]  pantoprazole (PROTONIX) 40 MG tablet Take 1 tablet (40 mg total) by mouth daily. 01/11/16   [name], MD  polyethylene glycol [facility] Floria Raveling) packet Take 17 g by mouth daily. 01/11/16   [name], MD  warfarin (COUMADIN) 5 MG tablet Take 1 and 1/2 tablet (7.5mg ) every Sunday and Thursday and 1 tablet (5mg ) all other days of the week. Patient taking differently: Take 5-7.5 mg by mouth See admin instructions. Take 1 1/2 tablet (7.5 mg) by mouth on Sunday and Thursday night, take 1 tablet (5 mg) on all other nights of the week 08/18/16   Martinique, Peter M, MD    Family History Family History  Problem Relation Age of Onset  . Other Mother        falopian tube during pregnancy   . Heart disease Father   . Cancer Brother        liver     Social History Social History   Tobacco Use  . Smoking status: Former Research scientist (life sciences)  . Smokeless tobacco: Never Used  . Tobacco comment: pt states he smokes 3-4 cigars every day  Substance Use Topics  . Alcohol use: Yes    Alcohol/week: 2.0 standard drinks    Types: 2 Cans of beer per week  . Drug use: No     Allergies   Doxycycline   Review of Systems Review of Systems  Constitutional: Negative for chills, diaphoresis, fatigue and fever.  HENT: Negative for congestion.   Eyes: Negative for visual disturbance.  Respiratory: Negative for cough, chest tightness, shortness of breath and wheezing.   Cardiovascular: Negative for chest pain and palpitations.  Gastrointestinal: Positive for abdominal pain and blood in stool. Negative for constipation, diarrhea, nausea and vomiting.  Genitourinary: Positive for frequency. Negative for dysuria and flank pain.  Musculoskeletal: Negative for back pain, neck pain and neck stiffness.  Skin: Negative for rash and wound.  Neurological:  Negative for light-headedness and headaches.  Psychiatric/Behavioral: Negative for agitation.  All other systems reviewed and are negative.    Physical Exam Updated Vital Signs BP (!) 143/52 (BP Location: Right Arm)  Pulse (!) 54   Resp 20   Ht 5\' 11"  (1.803 m)   Wt 72.6 kg   SpO2 99%   BMI 22.32 kg/m   Physical Exam  Constitutional: He is oriented to person, place, and time. He appears well-developed and well-nourished.  Non-toxic appearance. He does not appear ill. No distress.  HENT:  Head: Normocephalic and atraumatic.  Mouth/Throat: Oropharynx is clear and moist.  Eyes: Pupils are equal, round, and reactive to light. Conjunctivae and EOM are normal.  Neck: Neck supple.  Cardiovascular: Normal rate, regular rhythm and intact distal pulses.  No murmur heard. Pulmonary/Chest: Effort normal and breath sounds normal. No respiratory distress. He has no wheezes. He exhibits no tenderness.  Abdominal: Soft. He exhibits no distension. There is tenderness. There is no guarding.  Musculoskeletal: He exhibits no edema or tenderness.  Neurological: He is alert and oriented to person, place, and time.  Skin: Skin is warm and dry. Capillary refill takes less than 2 seconds. He is not diaphoretic. No erythema. No pallor.  Psychiatric: He has a normal mood and affect.  Nursing note and vitals reviewed.    ED Treatments / Results  Labs (all labs ordered are listed, but only abnormal results are displayed) Labs Reviewed  LIPASE, BLOOD - Abnormal; Notable for the following components:      Result Value   Lipase 95 (*)    All other components within normal limits  COMPREHENSIVE METABOLIC PANEL - Abnormal; Notable for the following components:   BUN 29 (*)    Creatinine, Ser 1.62 (*)    Total Protein 6.1 (*)    GFR calc non Af Amer 36 (*)    GFR calc Af Amer 42 (*)    All other components within normal limits  CBC - Abnormal; Notable for the following components:   WBC 3.4 (*)     RBC 2.84 (*)    Hemoglobin 10.2 (*)    HCT 30.6 (*)    MCV 107.7 (*)    MCH 35.9 (*)    Platelets 112 (*)    All other components within normal limits  URINALYSIS, ROUTINE W REFLEX MICROSCOPIC - Abnormal; Notable for the following components:   APPearance HAZY (*)    All other components within normal limits  URINE CULTURE  PROTIME-INR  I-STAT CG4 LACTIC ACID, ED  I-STAT TROPONIN, ED  I-STAT CG4 LACTIC ACID, ED  I-STAT TROPONIN, ED  TYPE AND SCREEN    EKG EKG Interpretation  Date/Time:  Thursday October 04 2017 08:12:59 EDT Ventricular Rate:  56 PR Interval:    QRS Duration: 190 QT Interval:  523 QTC Calculation: 505 R Axis:   -81 Text Interpretation:  Atrial fibrillation Ventricular premature complex Nonspecific IVCD with LAD LVH with secondary repolarization abnormality Anterior Q waves, possibly due to LVH Probable RV involvement, suggest recording right precordial leads When compared to prior, similar afib.  No STEMI Confirmed by Antony Blackbird [phone]) on 10/04/2017 8:18:39 AM   Radiology Ct Angio Chest/abd/pel For Dissection W And/or Wo Contrast  Result Date: 10/04/2017 CLINICAL DATA:  Chest pain.  Known abdominal aortic aneurysm. EXAM: CT ANGIOGRAPHY CHEST, ABDOMEN AND PELVIS TECHNIQUE: Multidetector CT imaging through the chest, abdomen and pelvis was performed using the standard protocol during bolus administration of intravenous contrast. Multiplanar reconstructed images and MIPs were obtained and reviewed to evaluate the vascular anatomy. CONTRAST:  32mL ISOVUE-370 IOPAMIDOL (ISOVUE-370) INJECTION 76% COMPARISON:  CT abdomen dated 01/28/2017. CT chest dated 11/07/2012. FINDINGS: CTA CHEST FINDINGS  Cardiovascular: Aortic atherosclerosis. No thoracic aortic aneurysm or dissection. Cardiomegaly. Diffuse coronary artery calcifications. Status post CABG. No pericardial effusion. No central obstructing pulmonary embolism appreciated. Mediastinum/Nodes: No mass or enlarged lymph  nodes appreciated within the mediastinum or perihilar regions. Esophagus appears normal. Trachea and central bronchi are unremarkable. Lungs/Pleura: Mild bibasilar atelectasis and/or chronic scarring/fibrosis, similar to previous exams. No new confluent consolidation to suggest a developing pneumonia. No pleural effusion or pneumothorax. Musculoskeletal: No acute or suspicious osseous finding. Mild degenerative spondylosis throughout the thoracic spine. Review of the MIP images confirms the above findings. CTA ABDOMEN AND PELVIS FINDINGS VASCULAR Aorta: Again noted is the known infrarenal abdominal aortic aneurysm, with the native aneurysm sac currently measuring 7.2 x 7 cm, not significantly changed in size or configuration compared to the most recent CT abdomen of 01/29/2017, perhaps slightly increased compared to measurements 7 x 6.7 cm on CT abdomen of 12/14/2011. The aorta bi-iliac stent graft remains patent. Again noted are thin wispy foci of contrast within the aneurysm sac, similar to the previous study 01/29/2017, a again suggesting type 2 endoleak. Metallic artifact again noted to the RIGHT of the stent graft, compatible with previously described coiling/embolization. Celiac: Prominent atherosclerotic changes at the takeoff of the celiac artery, but celiac artery trunk is patent and there is normal contrast flow into the splenic and hepatic artery branches. Chronic focal dissection flap within the common hepatic artery, presumably related to chronic ulcerated plaque. Contrast flow is present within the distal branches of the hepatic and splenic arteries. SMA: Prominent atherosclerotic changes at the takeoff of the superior mesenteric artery, but normal contrast flow is seen to the distal branches of the SMA. Renals: Heavy atherosclerotic changes at the takeoffs of the bilateral renal arteries, but contrast is seen to the distal portions of each renal artery. IMA: Patent, somewhat obscured by the infrarenal  abdominal aortic aneurysm. Inflow: Atherosclerotic changes throughout the common iliac and common femoral arteries, but vessels are patent throughout. Veins: No obvious venous abnormality within the limitations of this arterial phase study. Review of the MIP images confirms the above findings. NON-VASCULAR Hepatobiliary: No focal liver abnormality is seen. Status post cholecystectomy. No biliary dilatation. Pancreas: Unremarkable. No pancreatic ductal dilatation or surrounding inflammatory changes. Spleen: Normal in size without focal abnormality. Adrenals/Urinary Tract: Adrenal glands are unremarkable. LEFT renal cyst. Kidneys otherwise unremarkable without suspicious mass, stone or hydronephrosis. No ureteral or bladder calculi identified. Bladder is unremarkable. Stomach/Bowel: No dilated large or small bowel loops. No bowel wall thickening or evidence of bowel wall inflammation seen. Stomach is unremarkable, decompressed. Lymphatic: No enlarged lymph nodes seen. Reproductive: Prostate is unremarkable. Other: No free fluid or abscess collection within the abdomen or pelvis. No free intraperitoneal air. Musculoskeletal: Degenerative spondylitic changes throughout the lumbar spine, mild to moderate in degree. No acute or suspicious osseous finding. Review of the MIP images confirms the above findings. IMPRESSION: 1. No acute findings within the chest, abdomen or pelvis. 2. Infrarenal abdominal aortic aneurysm is stable in size and configuration compared to most recent CT abdomen of 01/28/2017, minimally enlarged compared to an older CT abdomen of 12/14/2011. Aorta bi-iliac stent graft is patent. As also described on the previous CT report, there is thin wispy contrast within the native hernia sac consistent with endoleak, most likely type 2 endoleak. However, again, the aneurysm sac is not significantly changed in size or configuration in the interval. No hemorrhage or inflammatory change within the periaortic  retroperitoneum. 3. Additional chronic/incidental findings detailed above. Aortic  aneurysm NOS (ICD10-I71.9). Aortic Atherosclerosis (ICD10-I70.0). Electronically Signed   By: Franki Cabot M.D.   On: 10/04/2017 09:03    Procedures Procedures (including critical care time)  Medications Ordered in ED Medications  sodium chloride 0.9 % bolus 500 mL (0 mLs Intravenous Stopped 10/04/17 0853)  iopamidol (ISOVUE-370) 76 % injection (80 mLs  Contrast Given 10/04/17 0815)  amLODipine (NORVASC) tablet 5 mg (5 mg Oral Given 10/04/17 1131)  hydrALAZINE (APRESOLINE) tablet 10 mg (10 mg Oral Given 10/04/17 1130)     Initial Impression / Assessment and Plan / ED Course  I have reviewed the triage vital signs and the nursing notes.  Pertinent labs & imaging results that were available during my care of the patient were reviewed by me and considered in my medical decision making (see chart for details).     [name] is a 84 y.o. male with a past medical history significant for AAA with prior stenting, chronic A. fib off of anticoagulation, CAD status post CABG, CHF, stroke, hypertension, and prior GI bleed who presents with severe abdominal pain.  Patient reports that at 1:30 AM last night he woke up with 8 out of 10 severe abdominal pain.  He described as diffuse and dull.  He says this feels different than prior abdominal pains.  He denies nausea, vomiting, chest pain, or shortness of breath.  He reports no recent conservation or diarrhea.  He does report urinary frequency at times.  He reports that over the last several weeks he has had intermittent rectal bleeding episodes.  He reports that he has had no fevers or chills.  He reports that he had a fall earlier this week without any continued pains.  He denied any symptoms yesterday.  He continues to take 81 mg of aspirin.    He reports that his aneurysm was over 7 cm when they decided not to pursue surgery during a previous visit.  On exam, abdomen  has mild tenderness diffusely.  No CVA tenderness.  Lungs clear and chest nontender.  Legs had palpable pulses however there was bilateral lower extremity edema.  Normal sensation and strength in legs.  Patient is alert and oriented.  Patient's pulse was bradycardic and intermittnetly in the 30s and 40s.   Clinically I am concerned that the patient's AAA may have had a partial dissection or bleed.  Patient will have CTA with contrast to further evaluate.  Given the patient's frequency, we will also obtain urinalysis to look for UTI or other lab abnormalities.  At this time, patient's pain has improved to a 4 out of 10 and he does not want pain medicine currently.  Next  Anticipate reassessment after work-up.  9:56 AM Diagnostic testing began to return.  Patient was found to have evidence of mild pancreatitis with a lipase elevated up to 92.  Patient also has an AKI with a creatinine of 1.6 up from 1.0.  Wife does report that 2 weeks ago patient had elevated creatinine however we cannot see that value.  Lastly, the CT scan did not show evidence of worsened aortic aneurysm.  There was no acute intra-abdominal, intrathoracic, or pelvic abnormality.  A shared decision-making conversation was held with the patient and family.  Given the likely dehydration and the pancreas-itis, we are going to give the patient his home blood pressure medicine he did not take for his elevated blood pressure currently.  We are going to have him p.o. challenge.  If patient is able to maintain  hydration and prove that he can hydrate as an outpatient, and if his blood pressure improves, he will likely be safe for discharge home based on our conversation.    If blood pressure is not improved after medicines or he has worsened nausea and vomiting and cannot take his blood pressure medicine given his AAA and blood pressure elevation, he will likely require observation admission.   1:35 PM On reassessment, patient reports feeling  much better.  He did ambulate and got hypoxic however family says this always happens when he ambulates.  He was recently started on oxygen at home and he can use it at home.  Patient's blood pressure remained elevated however he did not want to stay for further blood pressure management.  We discussed that the elevated blood pressure will likely cause problems with his aneurysm down the road and he is going to continue his home blood pressure medication.  Patient is going to call his cardiologist today or tomorrow to be seen for reassessment.  At this time, he has no abdominal pain and was able to eat and drink without difficulty.  I feel he can maintain hydration at home for his AKI.  Given his well appearance and his family understanding of plan of care, I feel he is safe for discharge home at this time.  Family understands extremity strict return precautions for nausea or vomiting, inability to tolerate fluids, worsened abdominal pain, or uncontrolled blood pressure.  Patient had no other worsens or concerns and was discharged in good condition.   Final Clinical Impressions(s) / ED Diagnoses   Final diagnoses:  Generalized abdominal pain  Acute pancreatitis, unspecified complication status, unspecified pancreatitis type  AKI (acute kidney injury) ([place])  Dehydration    ED Discharge Orders    None      Clinical Impression: 1. Generalized abdominal pain   2. Acute pancreatitis, unspecified complication status, unspecified pancreatitis type   3. AKI (acute kidney injury) ([place])   4. Dehydration     Disposition: Discharge  Condition: Good  I have discussed the results, Dx and Tx plan with the pt(& family if present). He/she/they expressed understanding and agree(s) with the plan. Discharge instructions discussed at great length. Strict return precautions discussed and pt &/or family have verbalized understanding of the instructions. No further questions at time of discharge.    New  Prescriptions   No medications on file    Follow Up: Tamsen Roers, [place] [id number] [phone]     [place] [address] [id number] [place]       Romario Tith, Gwenyth Allegra, MD 10/04/17 1534

## 2017-10-04 NOTE — ED Notes (Signed)
Ambulated while checking pt's O2. Pt ambulated in Pod D hallway from room D31 to [place]. Pt does walk with walker. Before we ambulated pt's O2 was at 99% while ambulating pt's O2 dropped down to 88%. Pt is back in bed and on monitor. Millie, RN notified.

## 2017-10-05 ENCOUNTER — Telehealth: Payer: Self-pay

## 2017-10-05 LAB — TYPE AND SCREEN
ABO/RH(D): O POS
Antibody Screen: POSITIVE
UNIT DIVISION: 0
Unit division: 0

## 2017-10-05 LAB — BPAM RBC
BLOOD PRODUCT EXPIRATION DATE: 201909232359
Blood Product Expiration Date: 201909102359
Unit Type and Rh: 5100
Unit Type and Rh: 5100

## 2017-10-05 LAB — URINE CULTURE: Culture: <10000 — AB

## 2017-10-05 NOTE — Telephone Encounter (Signed)
Attempted to contact patient. Unable to leave message as voicemail box is not set up

## 2017-10-08 ENCOUNTER — Telehealth: Payer: Self-pay

## 2017-10-08 NOTE — Telephone Encounter (Signed)
Phone call received from patient who was unable to schedule visit at this time. Patient's wife to return call.

## 2017-10-08 NOTE — Telephone Encounter (Signed)
Phone call placed to patient to offer to schedule a visit. Unable to leave visit.

## 2017-10-09 ENCOUNTER — Other Ambulatory Visit: Payer: Self-pay | Admitting: Family

## 2017-10-09 DIAGNOSIS — D649 Anemia, unspecified: Secondary | ICD-10-CM

## 2017-10-10 ENCOUNTER — Other Ambulatory Visit: Payer: Self-pay

## 2017-10-10 ENCOUNTER — Encounter: Payer: Self-pay | Admitting: Family

## 2017-10-10 ENCOUNTER — Inpatient Hospital Stay: Payer: Medicare HMO | Attending: Family | Admitting: Family

## 2017-10-10 ENCOUNTER — Inpatient Hospital Stay: Payer: Medicare HMO

## 2017-10-10 VITALS — BP 171/61 | HR 85 | Temp 97.6°F | Resp 20 | Ht 71.0 in | Wt 157.4 lb

## 2017-10-10 DIAGNOSIS — Z87891 Personal history of nicotine dependence: Secondary | ICD-10-CM | POA: Diagnosis not present

## 2017-10-10 DIAGNOSIS — I509 Heart failure, unspecified: Secondary | ICD-10-CM | POA: Insufficient documentation

## 2017-10-10 DIAGNOSIS — D500 Iron deficiency anemia secondary to blood loss (chronic): Secondary | ICD-10-CM | POA: Insufficient documentation

## 2017-10-10 DIAGNOSIS — D61818 Other pancytopenia: Secondary | ICD-10-CM

## 2017-10-10 DIAGNOSIS — Z7982 Long term (current) use of aspirin: Secondary | ICD-10-CM | POA: Insufficient documentation

## 2017-10-10 DIAGNOSIS — I4891 Unspecified atrial fibrillation: Secondary | ICD-10-CM | POA: Diagnosis not present

## 2017-10-10 DIAGNOSIS — K859 Acute pancreatitis without necrosis or infection, unspecified: Secondary | ICD-10-CM | POA: Insufficient documentation

## 2017-10-10 DIAGNOSIS — D649 Anemia, unspecified: Secondary | ICD-10-CM

## 2017-10-10 DIAGNOSIS — K4090 Unilateral inguinal hernia, without obstruction or gangrene, not specified as recurrent: Secondary | ICD-10-CM

## 2017-10-10 DIAGNOSIS — K219 Gastro-esophageal reflux disease without esophagitis: Secondary | ICD-10-CM | POA: Diagnosis not present

## 2017-10-10 DIAGNOSIS — R609 Edema, unspecified: Secondary | ICD-10-CM | POA: Diagnosis not present

## 2017-10-10 DIAGNOSIS — Z8673 Personal history of transient ischemic attack (TIA), and cerebral infarction without residual deficits: Secondary | ICD-10-CM | POA: Diagnosis not present

## 2017-10-10 DIAGNOSIS — K921 Melena: Secondary | ICD-10-CM | POA: Diagnosis not present

## 2017-10-10 DIAGNOSIS — Z7901 Long term (current) use of anticoagulants: Secondary | ICD-10-CM | POA: Diagnosis not present

## 2017-10-10 DIAGNOSIS — E039 Hypothyroidism, unspecified: Secondary | ICD-10-CM | POA: Insufficient documentation

## 2017-10-10 DIAGNOSIS — Z79899 Other long term (current) drug therapy: Secondary | ICD-10-CM | POA: Insufficient documentation

## 2017-10-10 DIAGNOSIS — I2510 Atherosclerotic heart disease of native coronary artery without angina pectoris: Secondary | ICD-10-CM | POA: Diagnosis not present

## 2017-10-10 DIAGNOSIS — D631 Anemia in chronic kidney disease: Secondary | ICD-10-CM

## 2017-10-10 DIAGNOSIS — J439 Emphysema, unspecified: Secondary | ICD-10-CM | POA: Diagnosis not present

## 2017-10-10 DIAGNOSIS — Z800 Family history of malignant neoplasm of digestive organs: Secondary | ICD-10-CM | POA: Insufficient documentation

## 2017-10-10 DIAGNOSIS — D4620 Refractory anemia with excess of blasts, unspecified: Secondary | ICD-10-CM

## 2017-10-10 DIAGNOSIS — I110 Hypertensive heart disease with heart failure: Secondary | ICD-10-CM | POA: Insufficient documentation

## 2017-10-10 LAB — CMP (CANCER CENTER ONLY)
ALT: 14 U/L (ref 0–44)
ANION GAP: 5 (ref 5–15)
AST: 21 U/L (ref 15–41)
Albumin: 4.1 g/dL (ref 3.5–5.0)
Alkaline Phosphatase: 84 U/L (ref 38–126)
BILIRUBIN TOTAL: 0.8 mg/dL (ref 0.3–1.2)
BUN: 19 mg/dL (ref 8–23)
CALCIUM: 10.5 mg/dL — AB (ref 8.9–10.3)
CO2: 28 mmol/L (ref 22–32)
CREATININE: 1.17 mg/dL (ref 0.61–1.24)
Chloride: 104 mmol/L (ref 98–111)
GFR, EST NON AFRICAN AMERICAN: 53 mL/min — AB (ref >60)
Glucose, Bld: 81 mg/dL (ref 70–99)
Potassium: 4.8 mmol/L (ref 3.5–5.1)
SODIUM: 137 mmol/L (ref 135–145)
TOTAL PROTEIN: 6.8 g/dL (ref 6.5–8.1)

## 2017-10-10 LAB — SAMPLE TO BLOOD BANK

## 2017-10-10 LAB — CBC WITH DIFFERENTIAL (CANCER CENTER ONLY)
BASOS PCT: 0 %
Basophils Absolute: 0 10*3/uL (ref 0.0–0.1)
EOS ABS: 0.1 10*3/uL (ref 0.0–0.5)
Eosinophils Relative: 3 %
HCT: 29.8 % — ABNORMAL LOW (ref 38.7–49.9)
Hemoglobin: 10.3 g/dL — ABNORMAL LOW (ref 13.0–17.1)
LYMPHS ABS: 0.7 10*3/uL — AB (ref 0.9–3.3)
Lymphocytes Relative: 17 %
MCH: 36.4 pg — AB (ref 28.0–33.4)
MCHC: 34.6 g/dL (ref 32.0–35.9)
MCV: 105.3 fL — ABNORMAL HIGH (ref 82.0–98.0)
MONO ABS: 0.4 10*3/uL (ref 0.1–0.9)
MONOS PCT: 11 %
Neutro Abs: 2.7 10*3/uL (ref 1.5–6.5)
Neutrophils Relative %: 69 %
Platelet Count: 119 10*3/uL — ABNORMAL LOW (ref 145–400)
RBC: 2.83 MIL/uL — ABNORMAL LOW (ref 4.20–5.70)
RDW: 13.9 % (ref 11.1–15.7)
WBC Count: 3.9 10*3/uL — ABNORMAL LOW (ref 4.0–10.0)

## 2017-10-10 LAB — RETICULOCYTES
RBC.: 2.86 MIL/uL — AB (ref 4.20–5.82)
RETIC CT PCT: 0.7 % — AB (ref 0.8–1.8)
Retic Count, Absolute: 20 10*3/uL — ABNORMAL LOW (ref 34.8–93.9)

## 2017-10-10 LAB — LACTATE DEHYDROGENASE: LDH: 136 U/L (ref 98–192)

## 2017-10-10 LAB — SAVE SMEAR

## 2017-10-10 LAB — VITAMIN B12: Vitamin B-12: 755 pg/mL (ref 180–914)

## 2017-10-10 NOTE — Progress Notes (Signed)
Hematology/Oncology Consultation   Name: [name]      MRN: [id number]    Location: Room/bed info not found  Date: 10/10/2017 Time:3:11 PM   REFERRING PHYSICIAN: Tamsen Roers, MD  REASON FOR CONSULT: Progressive anemia   DIAGNOSIS: Anemia   HISTORY OF PRESENT ILLNESS: [name] is a very pleasant 84 yo caucasian gentleman with progressive anemia. He was noted to have blood in his stool. He had been on coumadin for atrial fib. While on coumadin he was given doxycycline for a tick bite and also had several nose bleeding. Coumadin was stopped on July 1st.  He still noted some dark tarry stools but feels that it is a little better.  He has seen GI and discussed having a colonoscopy but feels at this time he is going to hold off.  He has had many surgeries in the past including CABG and has received transfusions when needed. He denies abnormal bleeding with surgery.  His skin is thin and he does bruise easily.  He has fatigue and SOB with exertion. His legs are week and he has had a fall.  He uses a walker for support when ambulating.  He used to be quite active and enjoyed running.  His BUN and creatinine were elevated last week. These have returned to normal but his calcium is slightly elevated. Erythropoietin is 22.  He has history of an inguinal hernia that can be uncomfortable at times. They state that the ED showed his wife how to "push it back in" if needed.  He has episodes of pancreatitis, most recently last week.  He is on synthroid for hypothyroidism.  Her takes Protonix for GERD He has had episodes of CHF exacerbation and his new cardiologist has adjusted his lasix which has helped significantly improve his fluid over load. He still has some swelling with +1-2 pitting edema at the ankles. Pedal pulses are 2+.  No c/o numbness or tingling in his extremities.  He denies any unexplained weight loss. He is eating well and staying hydrated.  He was a 3 ppd smoker but quit "years  ago." No ETOH.  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 He is quite interesting to talk to and has many wonderful stories.   ROS: All other 10 point review of systems is negative.   PAST MEDICAL HISTORY:   Past Medical History:  Diagnosis Date  . AAA (abdominal aortic aneurysm) ([place])    s/p stent graft in September 2009 & with attempted embolization/occlusion of vessels for a type 2 leak around the stent graft; Managed by Dr. Oneida Alar; last CT in 2012 showing the leak had closed.   . Anemia    on iron pills  . Aortic stenosis    s/p AVR in September 2012 per Dr. Cyndia Bent  . Arthritis   . Atrial fibrillation [facility])    s/p cardioversion October 2012  . CHF (congestive heart failure) ([place])    SEPT AND OCT 2012; follow up echo in October shows EF of 50%.   . Chronic anticoagulation   . Coronary artery disease    Remote CABG x 5 in 2000  . Emphysema of lung ([place])   . Gallstones    s/p cholecystectomy in Dec 2012  . GI bleeding 01/03/2016  . Gout   . High risk medication use    on amiodarone  . History of carotid stenosis    s/p L CEA in September 2006  . Hyperlipidemia   .  Hypertension   . Inguinal hernia    RIH  . PAT (paroxysmal atrial tachycardia) ([place])   . PVC's (premature ventricular contractions)   . Stroke [facility]) 2006   three strokes -no residuals  . Urinary frequency    at night  . Wears glasses     ALLERGIES: Allergies  Allergen Reactions  . Doxycycline Other (See Comments)    Severe nose bleeds      MEDICATIONS:  Current Outpatient Medications on File Prior to Visit  Medication Sig Dispense Refill  . acetaminophen (TYLENOL) 500 MG tablet Take 1,000 mg by mouth 2 (two) times daily as needed for moderate pain.     [name] allopurinol (ZYLOPRIM) 100 MG tablet TAKE ONE TABLET BY MOUTH ONCE DAILY 90 tablet 3  . amLODipine (NORVASC) 5 MG tablet Take 5 mg by mouth daily.  1  . aspirin EC 81 MG tablet Take 81 mg by mouth daily.    . bisacodyl  (BISACODYL) 5 MG EC tablet Take 5 mg by mouth daily as needed for moderate constipation.    . brimonidine-timolol (COMBIGAN) 0.2-0.5 % ophthalmic solution Place 1 drop into both eyes every 12 (twelve) hours.    . Carboxymeth-Glycerin-Polysorb (REFRESH OPTIVE ADVANCED) 0.5-1-0.5 % SOLN Place 1 drop into both eyes See admin instructions. Instill one drop into both eyes twice daily two minutes before administering other eye drops (to reduce irritation)    . colchicine 0.6 MG tablet Take 1 tablet (0.6 mg total) by mouth daily. (Patient taking differently: Take 0.6 mg by mouth daily as needed (gout flare). ) 30 tablet 0  . cyanocobalamin (,VITAMIN B-12,) 1000 MCG/ML injection Inject 1,000 mcg into the muscle See admin instructions. Inject 1000 mcg intramuscularly every 60 days (next injection due 01/30/17)    . ferrous sulfate 325 (65 FE) MG tablet TAKE 1 TABLET BY MOUTH ONCE DAILY WITH BREAKFAST (Patient taking differently: Take 325 mg by mouth daily with breakfast. ) 30 tablet 9  . folic acid (FOLVITE) 1 MG tablet Take 1 mg by mouth daily.    . furosemide (LASIX) 20 MG tablet TAKE 1 TABLET BY MOUTH ONCE DAILY AS NEEDED FOR EDEMA AND SHORTNESS OF BREATH (Patient taking differently: Take 20 mg by mouth 2 (two) times daily. ) 90 tablet 3  . latanoprost (XALATAN) 0.005 % ophthalmic solution Place 1 drop into both eyes at bedtime.    [name] levothyroxine (SYNTHROID, LEVOTHROID) 25 MCG tablet Take 1 tablet (25 mcg total) by mouth daily before breakfast. Keep OV 90 tablet 0  . losartan (COZAAR) 25 MG tablet TAKE 1 TABLET BY MOUTH ONCE DAILY 90 tablet 0  . Melatonin 1 MG CAPS Take 1 mg by mouth daily.    . Multiple Vitamin (MULTIVITAMIN WITH MINERALS) TABS tablet Take 1 tablet by mouth 2 (two) times daily.    . OXYGEN Inhale 2-4 L into the lungs.    [name] oxymetazoline (AFRIN) 0.05 % nasal spray Place 1 spray into both nostrils 2 (two) times daily as needed (nose bleeds).    . pantoprazole (PROTONIX) 40 MG tablet Take 1  tablet (40 mg total) by mouth daily. 60 tablet 2  . polyethylene glycol (MIRALAX / GLYCOLAX) packet Take 17 g by mouth daily. 14 each 4  . carvedilol (COREG) 6.25 MG tablet TAKE ONE TABLET BY MOUTH TWICE DAILY WITH MEALS (Patient not taking: Reported on 10/04/2017) 180 tablet 2  . warfarin (COUMADIN) 5 MG tablet Take 1 and 1/2 tablet (7.59m) every Sunday and Thursday and 1 tablet (  20m) all other days of the week. (Patient not taking: Reported on 10/04/2017) 32 tablet 1   No current facility-administered medications on file prior to visit.      PAST SURGICAL HISTORY Past Surgical History:  Procedure Laterality Date  . ABDOMINAL AORTAGRAM N/A 12/25/2011   Procedure: ABDOMINAL AMaxcine Ham  Surgeon: CElam Dutch MD;  [facility];  Service: Cardiovascular;  Laterality: N/A;  . ABDOMINAL AORTIC ANEURYSM REPAIR  2008   Gore Excluder Stent Graft repair  . ABDOMINAL AORTIC ANEURYSM REPAIR  2010/2012  . AORTIC VALVE REPLACEMENT  10/24/10   AVR  #25 mm Magna Ease pericardial valve  . CARDIAC CATHETERIZATION  04/29/1998   EF 60%  . CARDIOVASCULAR STRESS TEST  08/19/2009  . CARDIOVERSION N/A 11/11/2014   Procedure: CARDIOVERSION;  Surgeon: KDorothy Spark MD;  [place]  Service: Cardiovascular;  Laterality: N/A;  . CAROTID ENDARTERECTOMY  2006   Left Side  . CHOLECYSTECTOMY  01/23/2011   Procedure: LAPAROSCOPIC CHOLECYSTECTOMY WITH INTRAOPERATIVE CHOLANGIOGRAM;  Surgeon: MRolm Bookbinder MD;  Location: [facility];  Service: General;  Laterality: N/A;  . COLON SURGERY     lap right colon  . CORONARY ARTERY BYPASS GRAFT  1970   bypass and open heart surgery--pt states this is incorrect--his only cabg was in 2000  . CORONARY ARTERY BYPASS GRAFT  2000   5 vessel BY DR.BARTEL. LIMA GRAFT TO THE LAD, SEQUENTIAL SAPHENOUS VEIN GRAFT TO THE  FIRST DIAGONAL AND FIRST OBTUSE MARGINAL VESSELS, SAPHENOUS VEIN GRAFT TO THE SECOND OBTUSE MARGINAL VESSEL, AND SAPHENOUS VEIN GRAFT TO THE  PDA  . ESOPHAGOGASTRODUODENOSCOPY N/A 01/05/2016   Procedure: ESOPHAGOGASTRODUODENOSCOPY (EGD);  Surgeon: POtis Brace MD;  [place] [facility]  Service: Gastroenterology;  Laterality: N/A;  . EYE SURGERY     bilateral cataract extractions  . HEMORRHOID SURGERY    . HEMORRHOID SURGERY    . HERNIA REPAIR  10/13/10   RIH  . JOINT REPLACEMENT     Right TKR  . REPLACEMENT TOTAL KNEE  2008  . TONSILLECTOMY    . [place]ARD[place]GRAP[place]  08/18/2009   EF 55-60%  . [place]ARDIOGRAPHY  04/22/2007   EF 55-60%    FAMILY HISTORY: Family History  Problem Relation Age of Onset  . Other Mother        falopian tube during pregnancy   . Heart disease Father   . Cancer Brother        liver     SOCIAL HISTORY:  reports that he has quit smoking. He has never used smokeless tobacco. He reports that he drinks about 2.0 standard drinks of alcohol per week. He reports that he does not use drugs.  PERFORMANCE STATUS: The patient's performance status is 2 - Symptomatic, <50% confined to bed  PHYSICAL EXAM: Most Recent Vital Signs: Blood pressure (!) 171/61, pulse 85, temperature 97.6 F (36.4 C), temperature source Oral, resp. rate 20, height '5\' 11"'  (1.803 m), weight 157 lb 6.4 oz (71.4 kg), SpO2 100 %. BP (!) 171/61 (BP Location: Left Arm, Patient Position: Sitting)   Pulse 85   Temp 97.6 F (36.4 C) (Oral)   Resp 20   Ht '5\' 11"'  (1.803 m)   Wt 157 lb 6.4 oz (71.4 kg)   SpO2 100%   BMI 21.95 kg/m   General Appearance:    Alert, cooperative, no distress, appears stated age  Head:    Normocephalic, without obvious abnormality, atraumatic  Eyes:    PERRL, conjunctiva/corneas clear, EOM's intact,  fundi    benign, both eyes             Throat:   Lips, mucosa, and tongue normal; teeth and gums normal  Neck:   Supple, symmetrical, trachea midline, no adenopathy;       thyroid:  No enlargement/tenderness/nodules; no carotid   bruit or JVD  Back:     Symmetric, no curvature, ROM normal, no  CVA tenderness  Lungs:     Clear to auscultation bilaterally, respirations unlabored  Chest wall:    No tenderness or deformity  Heart:    Regular rate and rhythm, S1 and S2 normal, no murmur, rub   or gallop  Abdomen:     Soft, non-tender, bowel sounds active all four quadrants,    no masses, no organomegaly        Extremities:   Extremities normal, atraumatic, no cyanosis or edema  Pulses:   2+ and symmetric all extremities  Skin:   Skin color, texture, turgor normal, no rashes or lesions  Lymph nodes:   Cervical, supraclavicular, and axillary nodes normal  Neurologic:   CNII-XII intact. Normal strength, sensation and reflexes      throughout    LABORATORY DATA:  Results for orders placed or performed in visit on 10/10/17 (from the past 48 hour(s))  CBC with Differential ([place] Only)     Status: Abnormal   Collection Time: 10/10/17  1:30 PM  Result Value Ref Range   WBC Count 3.9 (L) 4.0 - 10.0 K/uL   RBC 2.83 (L) 4.20 - 5.70 MIL/uL   Hemoglobin 10.3 (L) 13.0 - 17.1 g/dL   HCT 29.8 (L) 38.7 - 49.9 %   MCV 105.3 (H) 82.0 - 98.0 fL   MCH 36.4 (H) 28.0 - 33.4 pg   MCHC 34.6 32.0 - 35.9 g/dL   RDW 13.9 11.1 - 15.7 %   Platelet Count 119 (L) 145 - 400 K/uL   Neutrophils Relative % 69 %   Neutro Abs 2.7 1.5 - 6.5 K/uL   Lymphocytes Relative 17 %   Lymphs Abs 0.7 (L) 0.9 - 3.3 K/uL   Monocytes Relative 11 %   Monocytes Absolute 0.4 0.1 - 0.9 K/uL   Eosinophils Relative 3 %   Eosinophils Absolute 0.1 0.0 - 0.5 K/uL   Basophils Relative 0 %   Basophils Absolute 0.0 0.0 - 0.1 K/uL    Comment: Performed at [facility], [address], [place], [place] [address]  Save smear     Status: None   Collection Time: 10/10/17  1:30 PM  Result Value Ref Range   Smear Review SMEAR STAINED AND AVAILABLE FOR REVIEW     Comment: Performed at [facility], [address], [place], [place] [address]      RADIOGRAPHY: No results found.      PATHOLOGY: None   ASSESSMENT/PLAN: Mr. Sangha is a very pleasant 84 yo caucasian gentleman with progressive anemia secondary to GI blood loss and nose bleeds on coumadin. Coumadin was stopped July 1st and he has noted improvement in the number of dark tarry stools he is having. He prefers to not have a colonoscopy at this time.  We will see what his iron studies show and then get him set up for infusion of needed.  Once we have these results we will get him scheduled for infusion and follow-up.    All questions were answered and they are in agreement with they plan. He will  contact our office with any questions or concerns. We can certainly see him sooner if need be.   He was discussed with and also seen by Dr. Marin Olp and he is in agreement with the aforementioned.   Laverna Peace, NP    Addendum: I saw and examined Mr. Southers with Judson Roch.  I agree with the above assessment.  At his age, he certainly could have myelodysplasia.  However, I really do not think that a bone marrow biopsy is indicated right now.  I looked at his smear under the microscope.  I really do not see anything with the blood smear that looks like a leukemia picture.  He had relatively adequate appearing white blood cells.  He had good maturity of his white blood cells.  I saw no hypersegmented polys.  Red cells show no nucleated red blood cells.  He had noted rouleaux formation.  He had no inclusion bodies.  Platelets were slightly decreased in number.  Platelets appear to be fairly well granulated.  His labs that have come back so far do show a low erythropoietin level.  It is only 22.  This might be his problem.  It would not surprise me if this was the problem.  His iron studies show a ferritin of 299 with an iron saturation of 36%.  His vitamin B12 is 755.  Right now, I think that we could just follow him along.  I again, just am not too impressed with his blood counts.  I realize that they are a little  bit low but I still think he is all that symptomatic.  He is a really nice guy.  He served our country.  He is a true American hero.  I would like to get him back to see [place] in about 4 5 weeks.  We will then make an assessment as to whether or not we should consider Aranesp for him.  We spent about 45 minutes with he and his wife.  All the time spent face-to-face.  I counseled them.  I help coordinate further care for them.  I answered all their questions.  Lattie Haw, MD

## 2017-10-11 ENCOUNTER — Encounter: Payer: Self-pay | Admitting: Family

## 2017-10-11 ENCOUNTER — Telehealth: Payer: Self-pay

## 2017-10-11 DIAGNOSIS — D4620 Refractory anemia with excess of blasts, unspecified: Secondary | ICD-10-CM

## 2017-10-11 DIAGNOSIS — D631 Anemia in chronic kidney disease: Secondary | ICD-10-CM

## 2017-10-11 DIAGNOSIS — D61818 Other pancytopenia: Secondary | ICD-10-CM | POA: Insufficient documentation

## 2017-10-11 HISTORY — DX: Refractory anemia with excess of blasts, unspecified: D46.20

## 2017-10-11 HISTORY — DX: Other pancytopenia: D61.818

## 2017-10-11 HISTORY — DX: Anemia in chronic kidney disease: D63.1

## 2017-10-11 LAB — FERRITIN: FERRITIN: 299 ng/mL (ref 24–336)

## 2017-10-11 LAB — IRON AND TIBC
Iron: 95 ug/dL (ref 42–163)
SATURATION RATIOS: 36 % — AB (ref 42–163)
TIBC: 261 ug/dL (ref 202–409)
UIBC: 167 ug/dL

## 2017-10-11 LAB — ERYTHROPOIETIN: ERYTHROPOIETIN: 22.4 m[IU]/mL — AB (ref 2.6–18.5)

## 2017-10-11 NOTE — Telephone Encounter (Signed)
Phone call placed to patient's wife to offer to schedule visit with Palliative Care. VM left with a few times for visits and with call back information.

## 2017-10-11 NOTE — Telephone Encounter (Signed)
Received return call from patient's wife to schedule visit with Palliative Care. Visit scheduled for 10/19/17

## 2017-10-19 ENCOUNTER — Telehealth: Payer: Self-pay | Admitting: Family

## 2017-10-19 ENCOUNTER — Other Ambulatory Visit: Payer: Medicare HMO | Admitting: Internal Medicine

## 2017-10-19 DIAGNOSIS — K861 Other chronic pancreatitis: Secondary | ICD-10-CM

## 2017-10-19 DIAGNOSIS — I509 Heart failure, unspecified: Secondary | ICD-10-CM

## 2017-10-19 DIAGNOSIS — M109 Gout, unspecified: Secondary | ICD-10-CM

## 2017-10-19 DIAGNOSIS — Z7189 Other specified counseling: Secondary | ICD-10-CM

## 2017-10-19 DIAGNOSIS — D649 Anemia, unspecified: Secondary | ICD-10-CM

## 2017-10-19 NOTE — Telephone Encounter (Signed)
I went over the patient's lab work with his wife. No intervention needed at this time per Dr. Marin Olp. All questions were answered and she verbalized understanding. We will plan to see him back in another month for follow-up. Scheduling message sent to [facility].

## 2017-10-20 NOTE — Progress Notes (Signed)
PALLIATIVE CARE CONSULT VISIT   PATIENT NAME: [name] DOB: 01-Aug-1926 MRN: [id number]  PRIMARY CARE PROVIDER:   Tamsen Roers, MD  REFERRING PROVIDER: Dr. Vernell Leep, Tamsen Roers, [place] [facility] [address], [place] [address]  RESPONSIBLE PARTY: Birch Farino (wife)  RECOMMENDATIONS and PLAN:  1. Goals of Care: Patient hoping to minimize visits to physicians or outside of home, though wishes ongoing medical evaluation and care. He wishes to minimize leaving his home.              A. Exploring home phlebotomy visits for monitoring of blood work. Awaiting call back from [facility] [phone],  [phone]), who may supply these services             B. Discussed calling notary services for home visit to sign health care directives.  2. CHF: Appears euvolemic by exam today with clear lung fields, minimal LE edema and resting O2 sats (room air) mid 90's. Follows low salt / no added salt diet. Resting bradycardia HR 50's, but exercise induced increase to mid 70's with ambulating 65 feet. Dyspnea after ambulation with room air sats to 86. Has oxygen 02 2L available. Notes fatigue and LE weakness with ambulation; consistently using walker for support.             A. Weight qam after voiding and before breakfast. Call Cardiology office for weight gain 4 lbs over 3 days, or otherwise as per Dr. Virgina Jock.  3. Recent ER visit for pancreatitis. Discussed possibility of ETOH as exacerbating factor. Patient was drinking 2-3 cans of beer qd but has discontinued few weeks ago.   4. Right ankle and toe pain;  ? Arthritic vs gouty flare. Last few weeks having nocturnal R foot ankle and toe pain. Described as severe, lasting a few hours. On allopurinol qd, and has been taking colchicine qd over this last few weeks without appreciable change. No joint swelling/redness but he mentions his past gouty flares are atypical in this way. Takes 2 tylenol bid. Plans f/u PCP Dr. Rex Kras.  5. Anemia;  GI blood loss setting of anticoagulation therapy. Coumadin was discontinued late July, and stools now less dark. Recent Hgb 11. Recent evaluation with Dr. Marin Olp; patient reports negative w/u. No plans for colonoscopy.  6. Advanced Care Directives: Discussed with patient, patient's son, and spouse. Confirmed wishes for DNR/DNI. MOST form presented and discusses. Written educational material reviewed and left in home. Encouraged family to discuss wishes amongst themselves. Patient desiring limited scope of medical services (avoid ICU; DNR/DNI) but are uncertain regarding other options such as IVFs/antibiotics/tube feedings. I emphasized no right or wrong answers, only what is right for them. To weight benefits vs. Burdens.              A. F/U NP visit 1-2 months (or earlier if family desires) to review and sign MOST form.  I spent 105 minutes providing this consultation,  from 3pm to 4:45pm. More than 50% of the time in this consultation was spent coordinating communication.   HISTORY OF PRESENT ILLNESS:  [name] is an 84 yo Caucasian male with history of  Acute on Chronic Diastolic CHF, CAD (CABG), Aortic Stenosis (AVR), Progressive Anemia (w/u ongoing by Dr. Marin Olp), Atrial Fibrillation (not on anticoagulation due to evidence recurrent GIB), Emphysema, remote GIB, and HTN. He had an ER visit  10/04/17 for abdominal pain, and was diagnosed with acute pancreatitis, AKI, and Dehydration. Referred to Palliative Care for  assistance with symptom management, caregiver concerns, and funcional decline.   CODE STATUS: DNR  PPS: 40%. Ambulates with independently with walker. Assist with hygiene. HOSPICE ELIGIBILITY/DIAGNOSIS: TBD  PAST MEDICAL HISTORY:  Past Medical History:  Diagnosis Date  . AAA (abdominal aortic aneurysm) ([place])    s/p stent graft in September 2009 & with attempted embolization/occlusion of vessels for a type 2 leak around the stent graft; Managed by Dr. Oneida Alar; last CT in 2012  showing the leak had closed.   . Anemia    on iron pills  . Aortic stenosis    s/p AVR in September 2012 per Dr. Cyndia Bent  . Arthritis   . Atrial fibrillation [facility])    s/p cardioversion October 2012  . CHF (congestive heart failure) ([place])    SEPT AND OCT 2012; follow up echo in October shows EF of 50%.   . Chronic anticoagulation   . Coronary artery disease    Remote CABG x 5 in 2000  . Emphysema of lung ([place])   . Erythropoietin deficiency anemia 10/11/2017  . Gallstones    s/p cholecystectomy in Dec 2012  . GI bleeding 01/03/2016  . Gout   . High risk medication use    on amiodarone  . History of carotid stenosis    s/p L CEA in September 2006  . Hyperlipidemia   . Hypertension   . Inguinal hernia    RIH  . Low grade myelodysplastic syndrome lesions ([place]) 10/11/2017  . Other pancytopenia ([place]) 10/11/2017  . PAT (paroxysmal atrial tachycardia) ([place])   . PVC's (premature ventricular contractions)   . Stroke [facility]) 2006   three strokes -no residuals  . Urinary frequency    at night  . Wears glasses     SOCIAL HX:  Social History   Tobacco Use  . Smoking status: Former Research scientist (life sciences)  . Smokeless tobacco: Never Used  . Tobacco comment: pt states he smokes 3-4 cigars every day  Substance Use Topics  . Alcohol use: Yes    Alcohol/week: 2.0 standard drinks    Types: 2 Cans of beer per week    ALLERGIES:  Allergies  Allergen Reactions  . Doxycycline Other (See Comments)    Severe nose bleeds     PERTINENT MEDICATIONS:  Outpatient Encounter Medications as of 10/19/2017  Medication Sig  . acetaminophen (TYLENOL) 500 MG tablet Take 1,000 mg by mouth 2 (two) times daily as needed for moderate pain.   [name] allopurinol (ZYLOPRIM) 100 MG tablet TAKE ONE TABLET BY MOUTH ONCE DAILY  . amLODipine (NORVASC) 5 MG tablet Take 5 mg by mouth daily.  [name] aspirin EC 81 MG tablet Take 81 mg by mouth daily.  . bisacodyl (BISACODYL) 5 MG EC tablet Take 5 mg by mouth daily as needed for moderate  constipation.  . brimonidine-timolol (COMBIGAN) 0.2-0.5 % ophthalmic solution Place 1 drop into both eyes every 12 (twelve) hours.  . Carboxymeth-Glycerin-Polysorb (REFRESH OPTIVE ADVANCED) 0.5-1-0.5 % SOLN Place 1 drop into both eyes See admin instructions. Instill one drop into both eyes twice daily two minutes before administering other eye drops (to reduce irritation)  . carvedilol (COREG) 6.25 MG tablet TAKE ONE TABLET BY MOUTH TWICE DAILY WITH MEALS (Patient not taking: Reported on 10/04/2017)  . colchicine 0.6 MG tablet Take 1 tablet (0.6 mg total) by mouth daily. (Patient taking differently: Take 0.6 mg by mouth daily as needed (gout flare). )  . cyanocobalamin (,VITAMIN B-12,) 1000 MCG/ML injection Inject 1,000 mcg into the muscle See admin  instructions. Inject 1000 mcg intramuscularly every 60 days (next injection due 01/30/17)  . ferrous sulfate 325 (65 FE) MG tablet TAKE 1 TABLET BY MOUTH ONCE DAILY WITH BREAKFAST (Patient taking differently: Take 325 mg by mouth daily with breakfast. )  . folic acid (FOLVITE) 1 MG tablet Take 1 mg by mouth daily.  . furosemide (LASIX) 20 MG tablet TAKE 1 TABLET BY MOUTH ONCE DAILY AS NEEDED FOR EDEMA AND SHORTNESS OF BREATH (Patient taking differently: Take 20 mg by mouth 2 (two) times daily. )  . latanoprost (XALATAN) 0.005 % ophthalmic solution Place 1 drop into both eyes at bedtime.  [name] levothyroxine (SYNTHROID, LEVOTHROID) 25 MCG tablet Take 1 tablet (25 mcg total) by mouth daily before breakfast. Keep OV  . losartan (COZAAR) 25 MG tablet TAKE 1 TABLET BY MOUTH ONCE DAILY  . Melatonin 1 MG CAPS Take 1 mg by mouth daily.  . Multiple Vitamin (MULTIVITAMIN WITH MINERALS) TABS tablet Take 1 tablet by mouth 2 (two) times daily.  . OXYGEN Inhale 2-4 L into the lungs.  [name] oxymetazoline (AFRIN) 0.05 % nasal spray Place 1 spray into both nostrils 2 (two) times daily as needed (nose bleeds).  . pantoprazole (PROTONIX) 40 MG tablet Take 1 tablet (40 mg total) by  mouth daily.  . polyethylene glycol (MIRALAX / GLYCOLAX) packet Take 17 g by mouth daily.  [name] warfarin (COUMADIN) 5 MG tablet Take 1 and 1/2 tablet (7.5mg ) every Sunday and Thursday and 1 tablet (5mg ) all other days of the week. (Patient not taking: Reported on 10/04/2017)   No facility-administered encounter medications on file as of 10/19/2017.     PHYSICAL EXAM:   General: NAD, frail and fatigued appearing, pleasantly conversant.  Cardiovascular: regular rate and rhythm Pulmonary: LSCA Abdomen: soft, nontender, + bowel sounds Extremities: Minimal LE softly pitting edema to lower calves.  no joint deformities Skin: no rashes Neurological: Weakness but otherwise nonfocal  Julianne Handler, NP

## 2017-10-25 ENCOUNTER — Ambulatory Visit: Payer: Medicare HMO | Admitting: Cardiology

## 2017-11-02 IMAGING — CR DG CHEST 1V PORT
1 series · 1 of 1 positions shown · non-contrast
Comparison: 09/07/2014

CLINICAL DATA: Syncope

EXAM:
PORTABLE CHEST 1 VIEW

[portable]
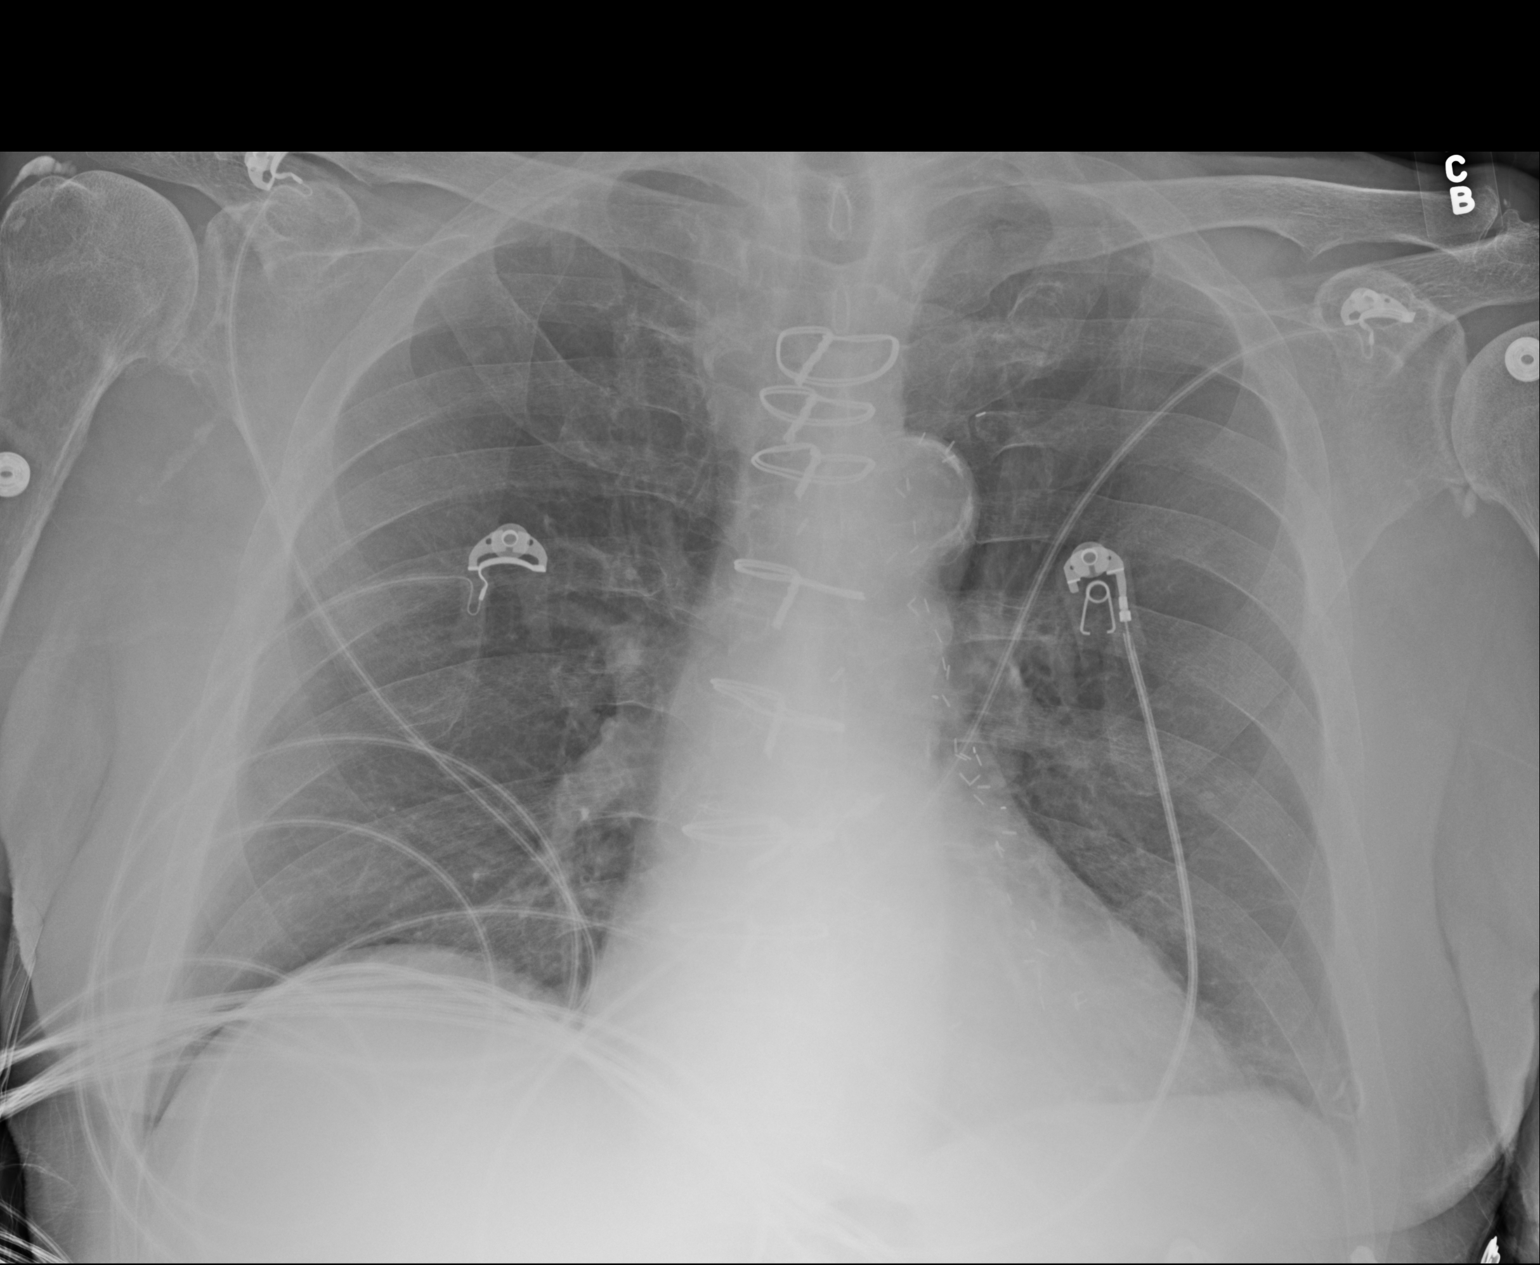

[1 of 1 positions shown; findings below may reference images not displayed]

FINDINGS: Normal heart size and mediastinal contours after CABG and aortic
valve replacement. There is no edema, consolidation, effusion, or
pneumothorax. No acute osseous finding.

EKG leads create artifact over the chest.
IMPRESSION: Stable.  No evidence of acute disease.

## 2017-11-21 ENCOUNTER — Other Ambulatory Visit: Payer: Medicare HMO

## 2017-11-21 ENCOUNTER — Ambulatory Visit: Payer: Medicare HMO | Admitting: Hematology & Oncology

## 2017-12-03 ENCOUNTER — Telehealth: Payer: Self-pay

## 2017-12-03 NOTE — Telephone Encounter (Signed)
VM left to offer to schedule visit with Palliative care. Call back info provided

## 2017-12-04 ENCOUNTER — Telehealth: Payer: Self-pay

## 2017-12-04 NOTE — Telephone Encounter (Signed)
Received return call from patient's wife, Lenell Antu. Hilda requested to schedule a visit with NP. Lenell Antu shared that patient has home health in through [facility]. Per report of Lenell Antu, patient has bilateral edema to lower extremities. He is unable to sleep at night due to increased pain to legs.Heart rate has been in the 40's. Patient has history of skin cancer and has a new area that patient's wife would like NP to evaluate. Visit schedule for Wednesday 12/05/17 @ 2pm.

## 2017-12-05 ENCOUNTER — Other Ambulatory Visit: Payer: Medicare HMO | Admitting: Primary Care

## 2017-12-05 DIAGNOSIS — I2510 Atherosclerotic heart disease of native coronary artery without angina pectoris: Secondary | ICD-10-CM

## 2017-12-05 DIAGNOSIS — N182 Chronic kidney disease, stage 2 (mild): Secondary | ICD-10-CM

## 2017-12-05 DIAGNOSIS — I5022 Chronic systolic (congestive) heart failure: Secondary | ICD-10-CM

## 2017-12-05 DIAGNOSIS — Z515 Encounter for palliative care: Secondary | ICD-10-CM

## 2017-12-05 DIAGNOSIS — K5900 Constipation, unspecified: Secondary | ICD-10-CM

## 2017-12-05 NOTE — Progress Notes (Signed)
PALLIATIVE CARE CONSULT VISIT   PATIENT NAME: [name] DOB: February 10, 1927 MRN: [id number]  PRIMARY CARE PROVIDER:   Tamsen Roers, MD  REFERRING PROVIDER:  Tamsen Roers, MD [place], [place] [address]  Dr. Vernell Griffith [place],  [place], [place], [address] [phone]   RESPONSIBLE PARTY:   Wife [name]  ASSESSMENT:     1. Cardio/Pulmonary Status: NYHA IV. Edema 4+ bilateral, pitting. Oxygen currently at 2 to 4 L per nasal cannula; saturation is 95% at rest but patient subjectively quickly decompensates with activity. Currently on Lasix 20 mg every  1-2 days, amlodipine, carvedilol,  oxygen. No longer responding well to medications.   2. Functional status: Functionality is poor, uses his walker and becomes extremely short of breath and must rest on short duration walks. Wife reports bathing has become very difficult due to effort required by patient.  Is now exhibiting difficulty in all ADLs. Requires assistance with all ADLs. Sitting in recliner most of the day due to dyspnea. Painful LE due to edema. Patient reticent to elevate due to increased pain. Needs more assistance as function declines.   3. Pain: Patient complains of  PVD and neuropathic pain that he rates at 8/10 and keeps him awake all night. He starts to sleep around 9 pm but awakens from 12 to 6 am in pain. Currently has norco for pain at hs. Needs neuropathic pain addressed.   4. Goals of Care: Palliative visit last month outlined difference between palliative and hospice care. Patient and wife report hospice has been suggested by his cardiologist. Home health has been involved for the past month for strengthening - PT, CNA and fluid /medication management by Therapist, sports. Patient does not feel he is benefitting from the home health plan of care now. Family and patient already to enroll on hospice service.  RECOMMENDATIONS and PLAN:  1. Cardiopulmonary function: Patient exhibits signs and symptoms  of end stage cardiac disease; Recommend comfort and supportive care by hospice referral. PCP or Cardiology to generate if in agreement.    Recommend increasing Lasix to at least 20 mg daily and 40 mg daily if tolerated. Education to elevate LE,  place pillow under legs to decrease painfulness.   2.Functional decline: See above re supportive care.   3. Pain: Recommend gabapentin 300 mg at hs immediately to address insomnia and pain, and titrate as tolerated to best pain relief effect. This course would be preferred to narcotic treatment to minimize overuse of narcotics.   4. Goals of care: Recommend Hospice referral. Discussed referral with [name]. He was willing to fax in referral for hospice but felt PCP Dr. Rex Griffith would be better suited to follow in the community. Phoned Dr. Rex Griffith. Left voicemail. Home health currently providing care and family understands that hospice election will discharge home health services and they are in agreement.  I spent 60 minutes providing this consultation,  from 1500 to 1600. More than 50% of the time in this consultation was spent coordinating communication.   HISTORY OF PRESENT ILLNESS:  [name] is a 84 y.o. 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 Palliative Care was asked to help address goals of care.   CODE STATUS:  DNR PPS:30% HOSPICE ELIGIBILITY/DIAGNOSIS: yes/ chronic systolic CHF  PAST MEDICAL HISTORY:  Past Medical History:  Diagnosis Date  .  AAA (abdominal aortic aneurysm) ([place])    s/p stent graft in September 2009 & with attempted embolization/occlusion of vessels for a type 2 leak around the stent graft; Managed by Dr. Oneida Alar; last CT in 2012 showing the leak had closed.   . Anemia    on iron pills  . Aortic stenosis    s/p AVR in September  2012 per Dr. Cyndia Bent  . Arthritis   . Atrial fibrillation [facility])    s/p cardioversion October 2012  . CHF (congestive heart failure) ([place])    SEPT AND OCT 2012; follow up echo in October shows EF of 50%.   . Chronic anticoagulation   . Coronary artery disease    Remote CABG x 5 in 2000  . Emphysema of lung ([place])   . Erythropoietin deficiency anemia 10/11/2017  . Gallstones    s/p cholecystectomy in Dec 2012  . GI bleeding 01/03/2016  . Gout   . High risk medication use    on amiodarone  . History of carotid stenosis    s/p L CEA in September 2006  . Hyperlipidemia   . Hypertension   . Inguinal hernia    RIH  . Low grade myelodysplastic syndrome lesions ([place]) 10/11/2017  . Other pancytopenia ([place]) 10/11/2017  . PAT (paroxysmal atrial tachycardia) ([place])   . PVC's (premature ventricular contractions)   . Stroke [facility]) 2006   three strokes -no residuals  . Urinary frequency    at night  . Wears glasses     SOCIAL HX:  Social History   Tobacco Use  . Smoking status: Former Research scientist (life sciences)  . Smokeless tobacco: Never Used  . Tobacco comment: pt states he smokes 3-4 cigars every day  Substance Use Topics  . Alcohol use: Yes    Alcohol/week: 2.0 standard drinks    Types: 2 Cans of beer per week    ALLERGIES:  Allergies  Allergen Reactions  . Doxycycline Other (See Comments)    Severe nose bleeds     PERTINENT MEDICATIONS:  Outpatient Encounter Medications as of 12/05/2017  Medication Sig  . acetaminophen (TYLENOL) 500 MG tablet Take 1,000 mg by mouth 2 (two) times daily as needed for moderate pain.   [name] allopurinol (ZYLOPRIM) 100 MG tablet TAKE ONE TABLET BY MOUTH ONCE DAILY  . amLODipine (NORVASC) 5 MG tablet Take 5 mg by mouth daily.  [name] aspirin EC 81 MG tablet Take 81 mg by mouth daily.  . bisacodyl (BISACODYL) 5 MG EC tablet Take 5 mg by mouth daily as needed for moderate constipation.  . brimonidine-timolol (COMBIGAN) 0.2-0.5 % ophthalmic solution Place 1 drop into both eyes  every 12 (twelve) hours.  . Carboxymeth-Glycerin-Polysorb (REFRESH OPTIVE ADVANCED) 0.5-1-0.5 % SOLN Place 1 drop into both eyes See admin instructions. Instill one drop into both eyes twice daily two minutes before administering other eye drops (to reduce irritation)  . carvedilol (COREG) 6.25 MG tablet TAKE ONE TABLET BY MOUTH TWICE DAILY WITH MEALS (Patient not taking: Reported on 10/04/2017)  . colchicine 0.6 MG tablet Take 1 tablet (0.6 mg total) by mouth daily. (Patient taking differently: Take 0.6 mg by mouth daily as needed (gout flare). )  . cyanocobalamin (,VITAMIN B-12,) 1000 MCG/ML injection Inject 1,000 mcg into the muscle See admin instructions. Inject 1000 mcg intramuscularly every 60 days (next injection due 01/30/17)  . ferrous sulfate 325 (65 FE) MG tablet TAKE 1 TABLET BY MOUTH ONCE DAILY WITH BREAKFAST (Patient taking differently: Take 325 mg by mouth daily with breakfast. )  .  folic acid (FOLVITE) 1 MG tablet Take 1 mg by mouth daily.  . furosemide (LASIX) 20 MG tablet TAKE 1 TABLET BY MOUTH ONCE DAILY AS NEEDED FOR EDEMA AND SHORTNESS OF BREATH (Patient taking differently: Take 20 mg by mouth 2 (two) times daily. )  . latanoprost (XALATAN) 0.005 % ophthalmic solution Place 1 drop into both eyes at bedtime.  [name] levothyroxine (SYNTHROID, LEVOTHROID) 25 MCG tablet Take 1 tablet (25 mcg total) by mouth daily before breakfast. Keep OV  . losartan (COZAAR) 25 MG tablet TAKE 1 TABLET BY MOUTH ONCE DAILY  . Melatonin 1 MG CAPS Take 1 mg by mouth daily.  . Multiple Vitamin (MULTIVITAMIN WITH MINERALS) TABS tablet Take 1 tablet by mouth 2 (two) times daily.  . OXYGEN Inhale 2-4 L into the lungs.  [name] oxymetazoline (AFRIN) 0.05 % nasal spray Place 1 spray into both nostrils 2 (two) times daily as needed (nose bleeds).  . pantoprazole (PROTONIX) 40 MG tablet Take 1 tablet (40 mg total) by mouth daily.  . polyethylene glycol (MIRALAX / GLYCOLAX) packet Take 17 g by mouth daily.  [name] warfarin  (COUMADIN) 5 MG tablet Take 1 and 1/2 tablet (7.5mg ) every Sunday and Thursday and 1 tablet (5mg ) all other days of the week. (Patient not taking: Reported on 10/04/2017)   No facility-administered encounter medications on file as of 12/05/2017.     PHYSICAL EXAM:  VS [id number] Weight unknown General: NAD, frail/ill  appearing,  Cardiovascular:S1 S2 S4, denies chest pain Pulmonary: Clear upper lobes, fine rales bil lower lobes Abdomen: soft, nontender, + bowel sounds. Endorses frequent constipation  Responsive to otc laxatives.  Extremities: 4+ bil  Edema to pre tibia, no joint deformities, no s/sx gout. Skin: no rashes, thinning in LE due to edema.  Neurological: Weakness, ataxia of gait, balance poor. Alert oriented x 2-3.  Cyndia Skeeters DNP, AGPCNP-BC

## 2017-12-07 ENCOUNTER — Telehealth: Payer: Self-pay | Admitting: Primary Care

## 2017-12-07 NOTE — Telephone Encounter (Signed)
Call per family request to ask for hospice referral from PCP. Several messages left. Will f/u on Monday. T/c to patient's wife to apprise her of requests.

## 2018-03-29 ENCOUNTER — Emergency Department (HOSPITAL_COMMUNITY)

## 2018-03-29 ENCOUNTER — Encounter (HOSPITAL_COMMUNITY): Payer: Self-pay | Admitting: Emergency Medicine

## 2018-03-29 ENCOUNTER — Emergency Department (HOSPITAL_COMMUNITY)
Admission: EM | Admit: 2018-03-29 | Discharge: 2018-03-29 | Disposition: A | Discharge status: Home / Self Care | Attending: Emergency Medicine | Admitting: Emergency Medicine

## 2018-03-29 ENCOUNTER — Other Ambulatory Visit: Payer: Self-pay

## 2018-03-29 DIAGNOSIS — M25552 Pain in left hip: Secondary | ICD-10-CM | POA: Insufficient documentation

## 2018-03-29 DIAGNOSIS — Z951 Presence of aortocoronary bypass graft: Secondary | ICD-10-CM | POA: Diagnosis not present

## 2018-03-29 DIAGNOSIS — M545 Low back pain, unspecified: Secondary | ICD-10-CM

## 2018-03-29 DIAGNOSIS — E039 Hypothyroidism, unspecified: Secondary | ICD-10-CM | POA: Diagnosis not present

## 2018-03-29 DIAGNOSIS — I130 Hypertensive heart and chronic kidney disease with heart failure and stage 1 through stage 4 chronic kidney disease, or unspecified chronic kidney disease: Secondary | ICD-10-CM | POA: Diagnosis not present

## 2018-03-29 DIAGNOSIS — Z96651 Presence of right artificial knee joint: Secondary | ICD-10-CM | POA: Diagnosis not present

## 2018-03-29 DIAGNOSIS — Z9049 Acquired absence of other specified parts of digestive tract: Secondary | ICD-10-CM | POA: Diagnosis not present

## 2018-03-29 DIAGNOSIS — Z79899 Other long term (current) drug therapy: Secondary | ICD-10-CM | POA: Insufficient documentation

## 2018-03-29 DIAGNOSIS — Z8673 Personal history of transient ischemic attack (TIA), and cerebral infarction without residual deficits: Secondary | ICD-10-CM | POA: Diagnosis not present

## 2018-03-29 DIAGNOSIS — I2510 Atherosclerotic heart disease of native coronary artery without angina pectoris: Secondary | ICD-10-CM | POA: Insufficient documentation

## 2018-03-29 DIAGNOSIS — N182 Chronic kidney disease, stage 2 (mild): Secondary | ICD-10-CM | POA: Diagnosis not present

## 2018-03-29 DIAGNOSIS — Z7982 Long term (current) use of aspirin: Secondary | ICD-10-CM | POA: Diagnosis not present

## 2018-03-29 DIAGNOSIS — I5020 Unspecified systolic (congestive) heart failure: Secondary | ICD-10-CM | POA: Diagnosis not present

## 2018-03-29 DIAGNOSIS — Z87891 Personal history of nicotine dependence: Secondary | ICD-10-CM | POA: Diagnosis not present

## 2018-03-29 MED ORDER — HYDROCODONE-ACETAMINOPHEN 5-325 MG PO TABS
1.0000 | ORAL_TABLET | Freq: Once | ORAL | Status: AC
  Administered 2018-03-29: 1 via ORAL
  Filled 2018-03-29: qty 1

## 2018-03-29 MED ORDER — LIDOCAINE 5 % EX PTCH
1.0000 | MEDICATED_PATCH | CUTANEOUS | Status: DC
  Administered 2018-03-29: 1 via TRANSDERMAL
  Filled 2018-03-29: qty 1

## 2018-03-29 MED ORDER — LIDOCAINE 5 % EX PTCH: 1.0000 | MEDICATED_PATCH | CUTANEOUS | 0 refills | Status: AC

## 2018-03-29 MED ORDER — ACETAMINOPHEN 325 MG PO TABS
650.0000 mg | ORAL_TABLET | Freq: Once | ORAL | Status: AC
  Administered 2018-03-29: 650 mg via ORAL
  Filled 2018-03-29: qty 2

## 2018-03-29 NOTE — ED Provider Notes (Signed)
[facility] EMERGENCY DEPARTMENT Provider Note   CSN: [id number] Arrival date & time: 03/29/18  0707     History   Chief Complaint Chief Complaint  Patient presents with  . Fall  . Back Pain    HPI [name] is a 85 y.o. male.  The history is provided by the patient.  Back Pain  Location:  Lumbar spine Quality:  Aching Radiates to:  Does not radiate Pain severity:  Mild Onset quality:  Sudden Timing:  Constant Progression:  Unchanged Chronicity:  New Context comment:  Fall off bed, did not hit head or LOC Relieved by:  Nothing Worsened by:  Palpation Associated symptoms: no abdominal pain, no abdominal swelling, no bladder incontinence, no bowel incontinence, no chest pain, no dysuria, no fever, no headaches, no leg pain, no numbness, no paresthesias, no pelvic pain, no perianal numbness, no tingling, no weakness and no weight loss   Risk factors comment:  Heart failure, on hospice   Past Medical History:  Diagnosis Date  . AAA (abdominal aortic aneurysm) ([place])    s/p stent graft in September 2009 & with attempted embolization/occlusion of vessels for a type 2 leak around the stent graft; Managed by Dr. Oneida Alar; last CT in 2012 showing the leak had closed.   . Anemia    on iron pills  . Aortic stenosis    s/p AVR in September 2012 per Dr. Cyndia Bent  . Arthritis   . Atrial fibrillation [facility])    s/p cardioversion October 2012  . CHF (congestive heart failure) ([place])    SEPT AND OCT 2012; follow up echo in October shows EF of 50%.   . Chronic anticoagulation   . Coronary artery disease    Remote CABG x 5 in 2000  . Emphysema of lung ([place])   . Erythropoietin deficiency anemia 10/11/2017  . Gallstones    s/p cholecystectomy in Dec 2012  . GI bleeding 01/03/2016  . Gout   . High risk medication use    on amiodarone  . History of carotid stenosis    s/p L CEA in September 2006  . Hyperlipidemia   . Hypertension   . Inguinal hernia    RIH    . Low grade myelodysplastic syndrome lesions ([place]) 10/11/2017  . Other pancytopenia ([place]) 10/11/2017  . PAT (paroxysmal atrial tachycardia) ([place])   . PVC's (premature ventricular contractions)   . Stroke [facility]) 2006   three strokes -no residuals  . Urinary frequency    at night  . Wears glasses     Patient Active Problem List   Diagnosis Date Noted  . Erythropoietin deficiency anemia 10/11/2017  . Other pancytopenia ([place]) 10/11/2017  . Low grade myelodysplastic syndrome lesions ([place]) 10/11/2017  . Gout attack 01/10/2016  . Constipation 01/10/2016  . Thrombocytopenia ([place]) 01/10/2016  . Hx of CABG   . History of completed stroke   . Status post abdominal aortic aneurysm (AAA) repair   . CKD stage G2/A2, GFR 60-89 and albumin creatinine ratio 30-299 mg/g   . Gastrointestinal hemorrhage 01/03/2016  . Chronic systolic heart failure ([place]) 11/10/2014  . Chronic anticoagulation 09/14/2014  . Acute on chronic systolic CHF (congestive heart failure) ([place]) 09/09/2014  . Essential hypertension   . Shortness of breath   . Hypertensive urgency 09/08/2014  . Epistaxis 01/15/2014  . AAA (abdominal aortic aneurysm) ([place]) 05/22/2012  . Occlusion and stenosis of carotid artery without mention of cerebral infarction 12/14/2011  . S/P AVR (aortic valve replacement)  07/31/2011  . Hypothyroidism 04/28/2011  . Abdominal pain 12/26/2010  . Dilated cardiomyopathy ([place]) 11/16/2010  . Coronary artery disease involving native coronary artery of native heart without angina pectoris   . HTN (hypertension), benign   . Hyperlipidemia   . Aortic valve disorders 11/08/2010  . Chronic atrial fibrillation 10/31/2010  . On warfarin therapy 10/31/2010  . Inguinal hernia unilateral, non-recurrent 10/03/2010  . ATHEROSCLEROTIC CARDIOVASCULAR DISEASE 10/01/2008  . NONSPECIFIC ABN FINDING RAD & OTH EXAM GI TRACT 10/01/2008    Past Surgical History:  Procedure Laterality Date  . ABDOMINAL AORTAGRAM N/A  12/25/2011   Procedure: ABDOMINAL Maxcine Ham;  Surgeon: Elam Dutch, MD;  Location: [facility] CATH LAB;  Service: Cardiovascular;  Laterality: N/A;  . ABDOMINAL AORTIC ANEURYSM REPAIR  2008   Gore Excluder Stent Graft repair  . ABDOMINAL AORTIC ANEURYSM REPAIR  2010/2012  . AORTIC VALVE REPLACEMENT  10/24/10   AVR  #25 mm Magna Ease pericardial valve  . CARDIAC CATHETERIZATION  04/29/1998   EF 60%  . CARDIOVASCULAR STRESS TEST  08/19/2009  . CARDIOVERSION N/A 11/11/2014   Procedure: CARDIOVERSION;  Surgeon: [name], MD;  Location: [facility];  Service: Cardiovascular;  Laterality: N/A;  . CAROTID ENDARTERECTOMY  2006   Left Side  . CHOLECYSTECTOMY  01/23/2011   Procedure: LAPAROSCOPIC CHOLECYSTECTOMY WITH INTRAOPERATIVE CHOLANGIOGRAM;  Surgeon: Rolm Bookbinder, MD;  Location: [facility] ORS;  Service: General;  Laterality: N/A;  . COLON SURGERY     lap right colon  . CORONARY ARTERY BYPASS GRAFT  1970   bypass and open heart surgery--pt states this is incorrect--his only cabg was in 2000  . CORONARY ARTERY BYPASS GRAFT  2000   5 vessel BY DR.BARTEL. [place] GRAFT TO THE LAD, SEQUENTIAL SAPHENOUS VEIN GRAFT TO THE  FIRST DIAGONAL AND FIRST OBTUSE MARGINAL VESSELS, SAPHENOUS VEIN GRAFT TO THE SECOND OBTUSE MARGINAL VESSEL, AND SAPHENOUS VEIN GRAFT TO THE PDA  . ESOPHAGOGASTRODUODENOSCOPY N/A 01/05/2016   Procedure: ESOPHAGOGASTRODUODENOSCOPY (EGD);  Surgeon: Otis Brace, MD;  Location: [facility];  Service: Gastroenterology;  Laterality: N/A;  . EYE SURGERY     bilateral cataract extractions  . HEMORRHOID SURGERY    . HEMORRHOID SURGERY    . HERNIA REPAIR  10/13/10   RIH  . JOINT REPLACEMENT     Right TKR  . REPLACEMENT TOTAL KNEE  2008  . TONSILLECTOMY    . US ECHOCARDIOGRAPHY  08/18/2009   EF 55-60%  . US ECHOCARDIOGRAPHY  04/22/2007   EF 55-60%        Home Medications    Prior to Admission medications   Medication Sig Start Date End Date Taking? Authorizing Provider    acetaminophen (TYLENOL) 500 MG tablet Take 1,000 mg by mouth 2 (two) times daily as needed for moderate pain.     [provider]  allopurinol (ZYLOPRIM) 100 MG tablet TAKE ONE TABLET BY MOUTH ONCE DAILY 02/12/17   [place], Peter M, MD  amLODipine (NORVASC) 5 MG tablet Take 5 mg by mouth daily. 09/20/17   [provider]  aspirin EC 81 MG tablet Take 81 mg by mouth daily.    [provider]  bisacodyl (BISACODYL) 5 MG EC tablet Take 5 mg by mouth daily as needed for moderate constipation.    [provider]  brimonidine-timolol (COMBIGAN) 0.2-0.5 % ophthalmic solution Place 1 drop into both eyes every 12 (twelve) hours.    [provider]  Carboxymeth-Glycerin-Polysorb (REFRESH OPTIVE ADVANCED) 0.5-1-0.5 % SOLN Place 1 drop into both eyes  See admin instructions. Instill one drop into both eyes twice daily two minutes before administering other eye drops (to reduce irritation)    [provider]  carvedilol (COREG) 6.25 MG tablet TAKE ONE TABLET BY MOUTH TWICE DAILY WITH MEALS Patient not taking: Reported on 10/04/2017 02/21/17   [place], Peter M, MD  colchicine 0.6 MG tablet Take 1 tablet (0.6 mg total) by mouth daily. Patient taking differently: Take 0.6 mg by mouth daily as needed (gout flare).  01/11/16   [name], MD  cyanocobalamin (,VITAMIN B-12,) 1000 MCG/ML injection Inject 1,000 mcg into the muscle See admin instructions. Inject 1000 mcg intramuscularly every 60 days (next injection due 01/30/17)    [provider]  ferrous sulfate 325 (65 FE) MG tablet TAKE 1 TABLET BY MOUTH ONCE DAILY WITH BREAKFAST Patient taking differently: Take 325 mg by mouth daily with breakfast.  12/04/16   [place], Peter M, MD  folic acid (FOLVITE) 1 MG tablet Take 1 mg by mouth daily.    [provider]  furosemide (LASIX) 20 MG tablet TAKE 1 TABLET BY MOUTH ONCE DAILY AS NEEDED FOR EDEMA AND SHORTNESS OF BREATH Patient taking differently: Take  20 mg by mouth 2 (two) times daily.  03/15/17   [place], Peter M, MD  latanoprost (XALATAN) 0.005 % ophthalmic solution Place 1 drop into both eyes at bedtime.    [provider]  levothyroxine (SYNTHROID, LEVOTHROID) 25 MCG tablet Take 1 tablet (25 mcg total) by mouth daily before breakfast. Keep OV 07/17/17   [place], Peter M, MD  lidocaine (LIDODERM) 5 % Place 1 patch onto the skin daily for 30 doses. Remove & Discard patch within 12 hours or as directed by MD 03/29/18 04/28/18  Lennice Sites, DO  losartan (COZAAR) 25 MG tablet TAKE 1 TABLET BY MOUTH ONCE DAILY 07/02/17   [place], Peter M, MD  Melatonin 1 MG CAPS Take 1 mg by mouth daily.    [provider]  Multiple Vitamin (MULTIVITAMIN WITH MINERALS) TABS tablet Take 1 tablet by mouth 2 (two) times daily.    [provider]  OXYGEN Inhale 2-4 L into the lungs.    [provider]  oxymetazoline (AFRIN) 0.05 % nasal spray Place 1 spray into both nostrils 2 (two) times daily as needed (nose bleeds).    [provider]  pantoprazole (PROTONIX) 40 MG tablet Take 1 tablet (40 mg total) by mouth daily. 01/11/16   [name], MD  polyethylene glycol [facility] / Floria Raveling) packet Take 17 g by mouth daily. 01/11/16   [name], MD  warfarin (COUMADIN) 5 MG tablet Take 1 and 1/2 tablet (7.5mg ) every Sunday and Thursday and 1 tablet (5mg ) all other days of the week. Patient not taking: Reported on 10/04/2017 08/18/16   [place], Peter M, MD    Family History Family History  Problem Relation Age of Onset  . Other Mother        falopian tube during pregnancy   . Heart disease Father   . Cancer Brother        liver     Social History Social History   Tobacco Use  . Smoking status: Former Research scientist (life sciences)  . Smokeless tobacco: Never Used  . Tobacco comment: pt states he smokes 3-4 cigars every day  Substance Use Topics  . Alcohol use: Yes    Alcohol/week: 2.0 standard drinks    Types: 2 Cans of beer per week  .  Drug use: No     Allergies  Doxycycline   Review of Systems Review of Systems  Constitutional: Negative for chills, fever and weight loss.  HENT: Negative for ear pain and sore throat.   Eyes: Negative for pain and visual disturbance.  Respiratory: Negative for cough and shortness of breath.   Cardiovascular: Negative for chest pain and palpitations.  Gastrointestinal: Negative for abdominal pain, bowel incontinence and vomiting.  Genitourinary: Negative for bladder incontinence, dysuria, hematuria and pelvic pain.  Musculoskeletal: Positive for arthralgias (baseline), back pain and gait problem (baseline).  Skin: Negative for color change and rash.  Neurological: Negative for tingling, seizures, syncope, weakness, numbness, headaches and paresthesias.  All other systems reviewed and are negative.    Physical Exam Updated Vital Signs  ED Triage Vitals  Enc Vitals Group     BP 03/29/18 0712 (!) 151/58     Pulse Rate 03/29/18 0712 (!) 56     Resp 03/29/18 0712 16     Temp 03/29/18 0712 (!) 97.5 F (36.4 C)     Temp Source 03/29/18 0712 Oral     SpO2 03/29/18 0712 90 %     Weight 03/29/18 0714 166 lb (75.3 kg)     Height 03/29/18 0714 5\' 11"  (1.803 m)     Head Circumference --      Peak Flow --      Pain Score 03/29/18 0713 6     Pain Loc --      Pain Edu? --      Excl. in [place]? --     Physical Exam Vitals signs and nursing note reviewed.  Constitutional:      Appearance: He is well-developed.  HENT:     Head: Normocephalic and atraumatic.     Nose: Nose normal.     Mouth/Throat:     Mouth: Mucous membranes are moist.  Eyes:     Extraocular Movements: Extraocular movements intact.     Conjunctiva/sclera: Conjunctivae normal.     Pupils: Pupils are equal, round, and reactive to light.  Neck:     Musculoskeletal: Neck supple.  Cardiovascular:     Rate and Rhythm: Normal rate and regular rhythm.     Pulses: Normal pulses.     Heart sounds: Normal heart sounds.  No murmur.  Pulmonary:     Effort: Pulmonary effort is normal. No respiratory distress.     Breath sounds: Normal breath sounds.  Abdominal:     Palpations: Abdomen is soft.     Tenderness: There is no abdominal tenderness.  Musculoskeletal:        General: Tenderness (TTP to paraspine lumbar muscles and around left hip) present.  Skin:    General: Skin is warm and dry.  Neurological:     General: No focal deficit present.     Mental Status: He is alert and oriented to person, place, and time.     Cranial Nerves: No cranial nerve deficit.     Sensory: No sensory deficit.     Motor: No weakness.     Coordination: Coordination normal.     Comments: 5+/5 strength throughout, sensation grossly intact, no drift, normal speech      ED Treatments / Results  Labs (all labs ordered are listed, but only abnormal results are displayed) Labs Reviewed - No data to display  EKG None  Radiology Dg Lumbar Spine Complete  Result Date: 03/29/2018 CLINICAL DATA:  Lumbago EXAM: LUMBAR SPINE - COMPLETE 4+ VIEW COMPARISON:  July 27, 2014 FINDINGS: Frontal, lateral, spot lumbosacral lateral, and  bilateral oblique views were obtained. There are 6 non-rib-bearing lumbar type vertebral bodies, noted as on previous study with labeling L1 through L5 with a lumbarized S1. Using this number a shin, there is slight anterior wedging of the L2 vertebral body, not present on the 2016 study. There remains 3 mm of anterolisthesis of L5 on S1. No new spondylolisthesis. There is moderately severe disc space narrowing at L3-4, L4-5, and L5-S1. There is facet osteoarthritic change at all levels, most notably at L4-5, L5-S1, and S1-S2. There is an aortic stent graft with calcification noted in abdominal aortic aneurysm which is stented. There are metallic coils noted within the stent graft. IMPRESSION: Mild anterior wedging of the L2 vertebral body, not present on 2016 study. Mild spondylolisthesis at L5-S1 is a stable  finding. There is multilevel arthropathy. Persistent aortic stent graft with the pleural present within the graft. Electronically Signed   By: Lowella Grip III M.D.   On: 03/29/2018 09:07   Dg Hip Unilat With Pelvis 2-3 Views Left  Result Date: 03/29/2018 CLINICAL DATA:  Pain EXAM: DG HIP (WITH OR WITHOUT PELVIS) 2-3V LEFT COMPARISON:  None. FINDINGS: Frontal pelvis as well as frontal and lateral left hip joint images were obtained. There is no appreciable fracture or dislocation. Joint spaces appear unremarkable. No erosive change. Bones are osteoporotic. There is extensive aortoiliac and femoral artery atherosclerosis bilaterally. There are stents in the distal aorta and common iliac arteries with evidence of a distal abdominal aortic aneurysm. IMPRESSION: Bones osteoporotic. No fracture or dislocation. Joint spaces appear normal. Status post stent grafting for abdominal aortic aneurysm. Extensive multifocal arterial vascular calcification. Electronically Signed   By: Lowella Grip III M.D.   On: 03/29/2018 09:03    Procedures Procedures (including critical care time)  Medications Ordered in ED Medications  lidocaine (LIDODERM) 5 % 1 patch (1 patch Transdermal Patch Applied 03/29/18 0806)  HYDROcodone-acetaminophen (NORCO/VICODIN) 5-325 MG per tablet 1 tablet (1 tablet Oral Given 03/29/18 0806)  acetaminophen (TYLENOL) tablet 650 mg (650 mg Oral Given 03/29/18 0806)     Initial Impression / Assessment and Plan / ED Course  I have reviewed the triage vital signs and the nursing notes.  Pertinent labs & imaging results that were available during my care of the patient were reviewed by me and considered in my medical decision making (see chart for details).     [name] is an 85 year old male history of high cholesterol, stroke, heart failure on hospice who presents to the ED with lower back pain, left hip pain after ground-level fall.  Patient with normal vitals.  No fever.   Patient normally ambulates with a walker and sometimes a wheelchair.  When he got up out of bed today he slid down and landed on his buttocks.  Has had pain in the left lower back area left hip area since.  Patient does use a wheelchair at home.  He is currently hospice at home.  He does use narcotic pain medicine at baseline but has not had his morning dose.  Patient is mostly tender over the left hip area and left lower lumbar area.  There is no midline spinal tenderness.  No obvious step-offs.  Patient is neurologically intact on exam.  No symptoms to suggest any cord compression.  X-rays were overall unremarkable.  There was some slight anterior wedging to L2 that was new from 2016 but patient is not particularly tender in this area.  Hip x-rays were negative.  Patient was  given Norco, lidocaine patch, Tylenol with improvement of pain.  Recommend that he uses his wheelchair.  He does have access already to home health aide and may benefit from physical therapy and recommend that he talk with his hospice provider about that.  Patient was discharged home with lidocaine patch.  Given return precautions.  Patient did not hit his head, did not lose consciousness.  Has been neurologically stable throughout my care.  This chart was dictated using voice recognition software.  Despite best efforts to proofread,  errors can occur which can change the documentation meaning.    Final Clinical Impressions(s) / ED Diagnoses   Final diagnoses:  Acute left-sided low back pain without sciatica    ED Discharge Orders         Ordered    lidocaine (LIDODERM) 5 %  Every 24 hours     03/29/18 [place], [place], DO 03/29/18 [phone]

## 2018-03-29 NOTE — ED Notes (Signed)
Patient transported to X-ray 

## 2018-03-29 NOTE — Progress Notes (Signed)
[facility]  Patient will need ambulance transport back to his home.  Please use [facility] for transport as they contract this service for [place].  Thank you, Venia Carbon BSN, RN [employer] (direct dial listed in [place]

## 2018-03-29 NOTE — ED Triage Notes (Signed)
Per [facility] pt coming from home after having fall causing pt. to land on lower back area. Pt complaining of pain in the lower back area. No LOC. Pt. Is currently a hospice pt.

## 2018-04-14 DEATH — deceased

## 2019-08-04 IMAGING — CT CT ANGIO CHEST-ABD-PELV FOR DISSECTION W/ AND WO/W CM
2 of 6 series · 11 of 36 positions shown, 14 images · IV contrast (OMNI 350)
Comparison: CT abdomen dated 01/28/2017. CT chest dated 11/07/2012.

CLINICAL DATA: Chest pain.  Known abdominal aortic aneurysm.

EXAM:
CT ANGIOGRAPHY CHEST, ABDOMEN AND PELVIS
TECHNIQUE: Multidetector CT imaging through the chest, abdomen and pelvis was
performed using the standard protocol during bolus administration of
intravenous contrast. Multiplanar reconstructed images and MIPs were
obtained and reviewed to evaluate the vascular anatomy.
CONTRAST:  80mL GWACNA-D03 IOPAMIDOL (GWACNA-D03) INJECTION 76%

[Series 7: dissection 2mm · axial · 0.76mm/px · z∈[+828,+1402]mm · 10 of 334 slices shown, 13 images]
[im 31/334  mediastinal]
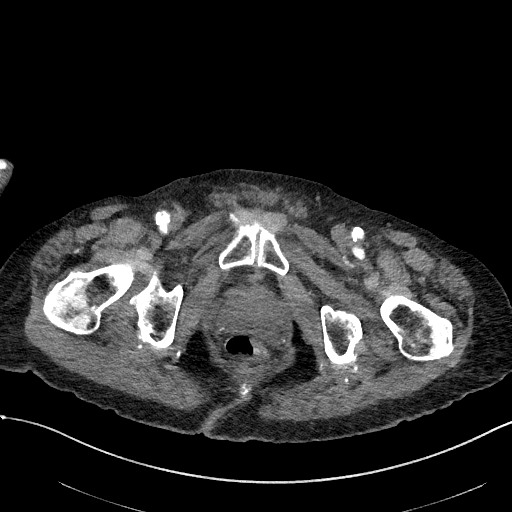
[im 31/334  bone]
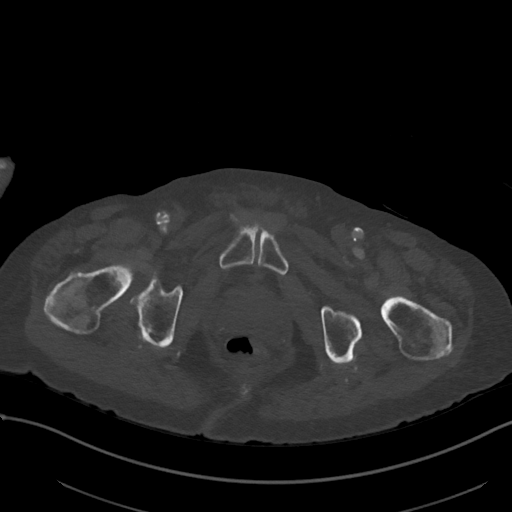
[im 61/334  mediastinal]
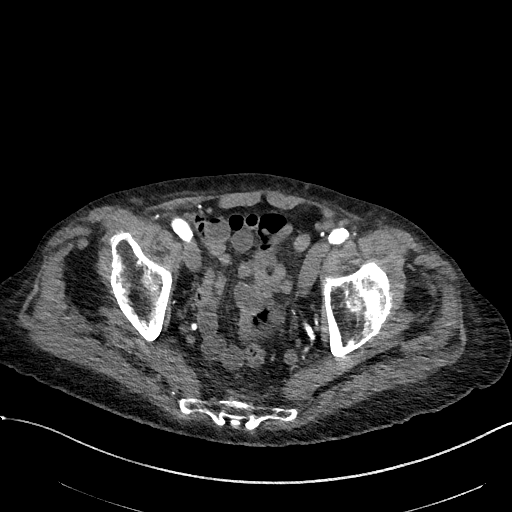
[im 106/334  mediastinal]
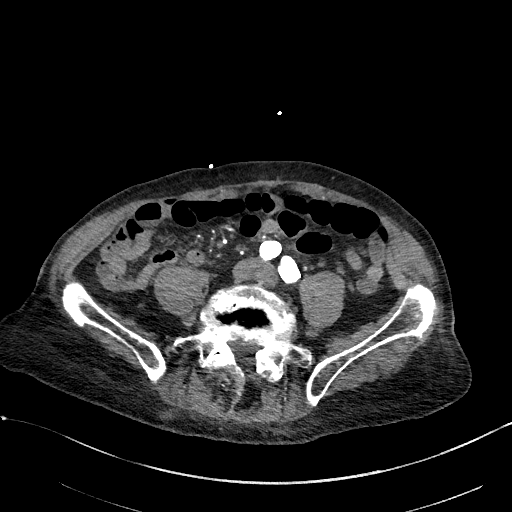
[im 152/334  mediastinal]
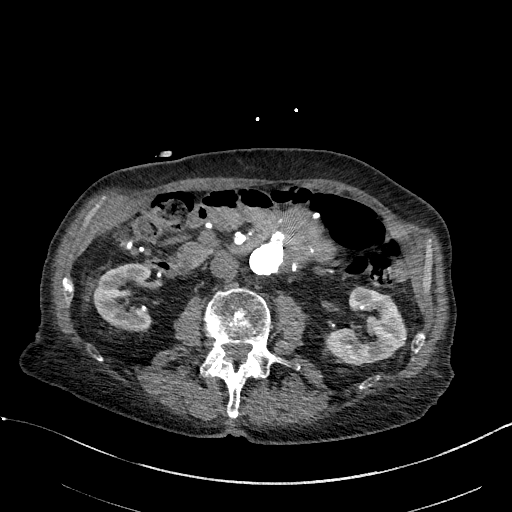
[im 182/334  mediastinal]
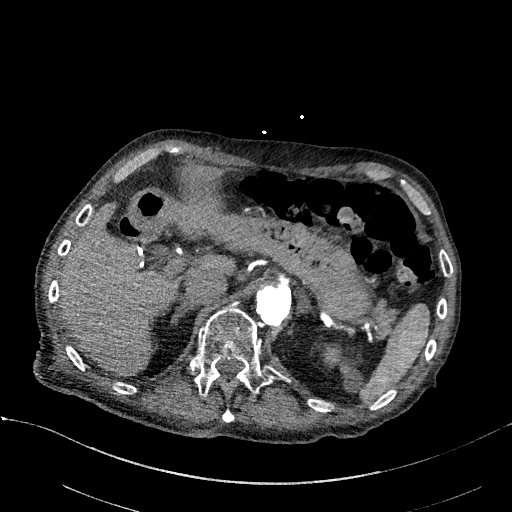
[im 228/334  mediastinal]
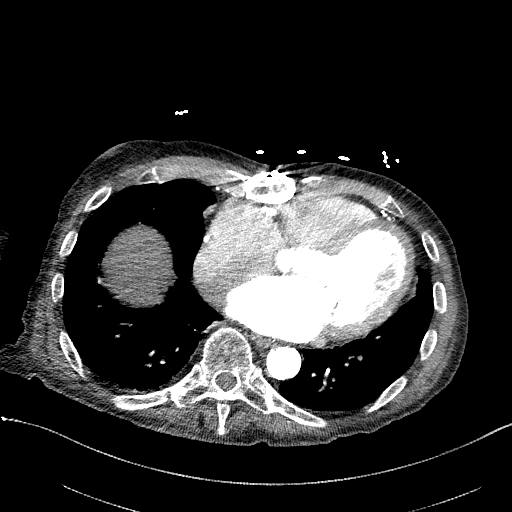
[im 273/334  mediastinal]
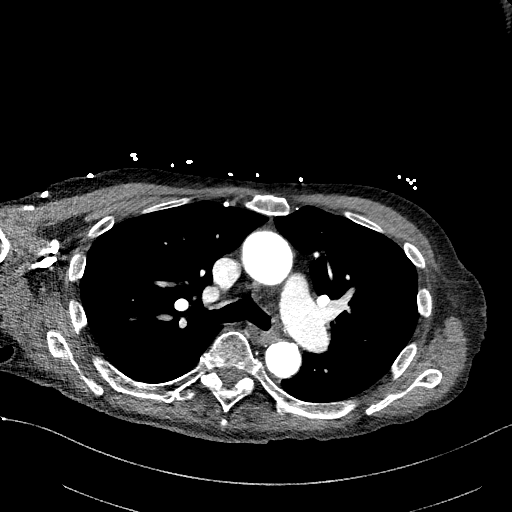
[im 273/334  lung]
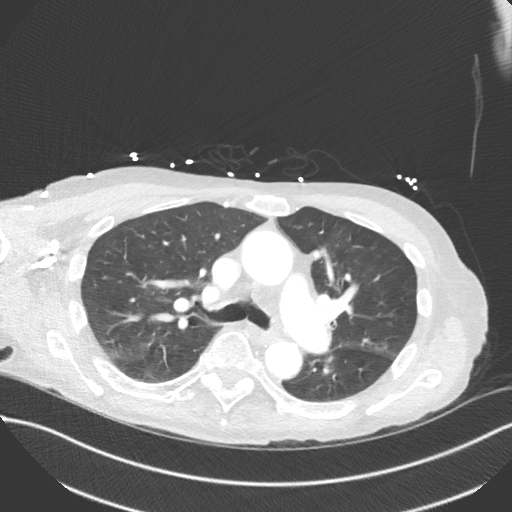
[im 288/334  lung]
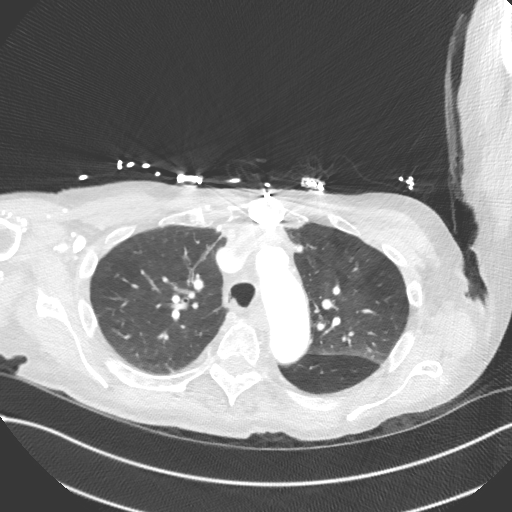
[im 303/334  mediastinal]
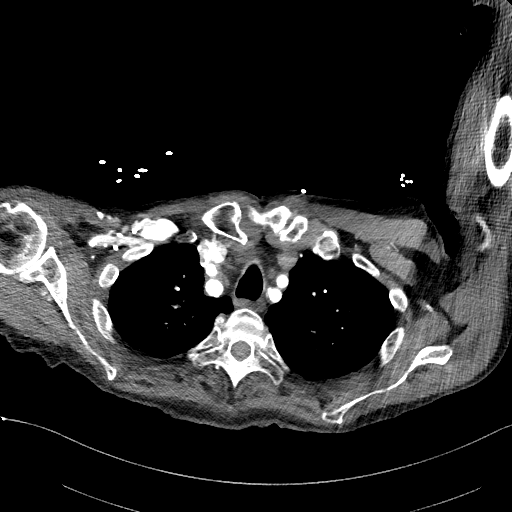
[im 303/334  lung]
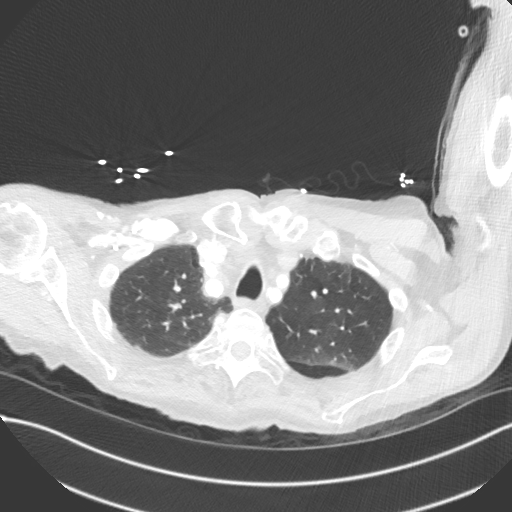
[im 318/334  lung]
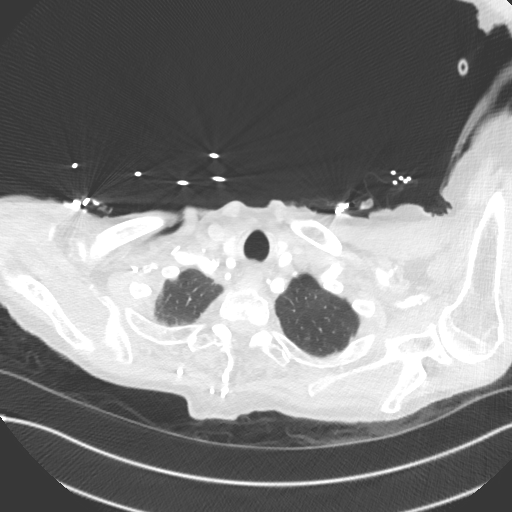

[Series 10: dissection 2mm cor · coronal · 0.69mm/px · 1 of 116 slices shown]
[im 58/116  mediastinal]
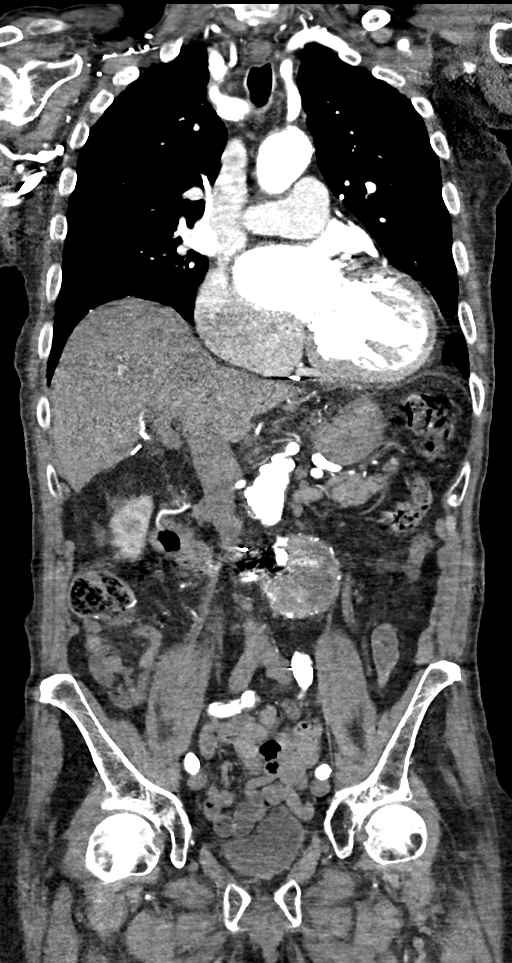

[11 of 36 positions shown; findings below may reference images not displayed]

FINDINGS: CTA CHEST FINDINGS

Cardiovascular: Aortic atherosclerosis. No thoracic aortic aneurysm
or dissection.

Cardiomegaly. Diffuse coronary artery calcifications. Status post
CABG. No pericardial effusion. No central obstructing pulmonary
embolism appreciated.

Mediastinum/Nodes: No mass or enlarged lymph nodes appreciated
within the mediastinum or perihilar regions. Esophagus appears
normal. Trachea and central bronchi are unremarkable.

Lungs/Pleura: Mild bibasilar atelectasis and/or chronic
scarring/fibrosis, similar to previous exams. No new confluent
consolidation to suggest a developing pneumonia. No pleural effusion
or pneumothorax..

Musculoskeletal: No acute or suspicious osseous finding. Mild
degenerative spondylosis throughout the thoracic spine.

Review of the MIP images confirms the above findings.

CTA ABDOMEN AND PELVIS FINDINGS

VASCULAR

Aorta: Again noted is the known infrarenal abdominal aortic
aneurysm, with the native aneurysm sac currently measuring 7.2 x 7
cm, not significantly changed in size or configuration compared to
the most recent CT abdomen of 01/29/2017, perhaps slightly increased
compared to measurements 7 x 6.7 cm on CT abdomen of 12/14/2011. The
aorta bi-iliac stent graft remains patent.

Again noted are thin wispy foci of contrast within the aneurysm sac,
similar to the previous study 01/29/2017, a again suggesting type 2
endoleak.

Metallic artifact again noted to the RIGHT of the stent graft,
compatible with previously described coiling/embolization.

Celiac: Prominent atherosclerotic changes at the takeoff of the
celiac artery, but celiac artery trunk is patent and there is normal
contrast flow into the splenic and hepatic artery branches.

Chronic focal dissection flap within the common hepatic artery,
presumably related to chronic ulcerated plaque. Contrast flow is
present within the distal branches of the hepatic and splenic
arteries.

SMA: Prominent atherosclerotic changes at the takeoff of the
superior mesenteric artery, but normal contrast flow is seen to the
distal branches of the SMA.

Renals: Heavy atherosclerotic changes at the takeoffs of the
bilateral renal arteries, but contrast is seen to the distal
portions of each renal artery.

IMA: Patent, somewhat obscured by the infrarenal abdominal aortic
aneurysm.

Inflow: Atherosclerotic changes throughout the common iliac and
common femoral arteries, but vessels are patent throughout.

Veins: No obvious venous abnormality within the limitations of this
arterial phase study.

Review of the MIP images confirms the above findings.

NON-VASCULAR

Hepatobiliary: No focal liver abnormality is seen. Status post
cholecystectomy. No biliary dilatation.

Pancreas: Unremarkable. No pancreatic ductal dilatation or
surrounding inflammatory changes.

Spleen: Normal in size without focal abnormality.

Adrenals/Urinary Tract: Adrenal glands are unremarkable. LEFT renal
cyst. Kidneys otherwise unremarkable without suspicious mass, stone
or hydronephrosis. No ureteral or bladder calculi identified.
Bladder is unremarkable.

Stomach/Bowel: No dilated large or small bowel loops. No bowel wall
thickening or evidence of bowel wall inflammation seen. Stomach is
unremarkable, decompressed.

Lymphatic: No enlarged lymph nodes seen.

Reproductive: Prostate is unremarkable.

Other: No free fluid or abscess collection within the abdomen or
pelvis. No free intraperitoneal air.

Musculoskeletal: Degenerative spondylitic changes throughout the
lumbar spine, mild to moderate in degree. No acute or suspicious
osseous finding.

Review of the MIP images confirms the above findings.
IMPRESSION: 1. No acute findings within the chest, abdomen or pelvis.
2. Infrarenal abdominal aortic aneurysm is stable in size and
configuration compared to most recent CT abdomen of 01/28/2017,
minimally enlarged compared to an older CT abdomen of 12/14/2011.
Aorta bi-iliac stent graft is patent. As also described on the
previous CT report, there is thin wispy contrast within the native
hernia sac consistent with endoleak, most likely type 2 endoleak.
However, again, the aneurysm sac is not significantly changed in
size or configuration in the interval. No hemorrhage or inflammatory
change within the periaortic retroperitoneum.
3. Additional chronic/incidental findings detailed above.

Aortic aneurysm NOS (6IVP4-GUQ.5).

Aortic Atherosclerosis (6IVP4-T62.2).

## 2022-07-25 ENCOUNTER — Encounter: Payer: Self-pay | Admitting: Hematology & Oncology
